# Patient Record
Sex: Female | Born: 1945 | Race: White | Hispanic: No | Marital: Married | Smoking: Former smoker
Health system: Southern US, Community
[De-identification: ages and names within clinical notes are randomized; demographics above are authoritative.]

## PROBLEM LIST (undated history)

## (undated) DIAGNOSIS — F329 Major depressive disorder, single episode, unspecified: Secondary | ICD-10-CM

## (undated) DIAGNOSIS — J45909 Unspecified asthma, uncomplicated: Secondary | ICD-10-CM

## (undated) DIAGNOSIS — E785 Hyperlipidemia, unspecified: Secondary | ICD-10-CM

## (undated) DIAGNOSIS — K219 Gastro-esophageal reflux disease without esophagitis: Secondary | ICD-10-CM

## (undated) DIAGNOSIS — F419 Anxiety disorder, unspecified: Secondary | ICD-10-CM

## (undated) DIAGNOSIS — D759 Disease of blood and blood-forming organs, unspecified: Secondary | ICD-10-CM

## (undated) DIAGNOSIS — F32A Depression, unspecified: Secondary | ICD-10-CM

## (undated) DIAGNOSIS — M1990 Unspecified osteoarthritis, unspecified site: Secondary | ICD-10-CM

## (undated) DIAGNOSIS — D680 Von Willebrand's disease: Secondary | ICD-10-CM

## (undated) DIAGNOSIS — R0600 Dyspnea, unspecified: Secondary | ICD-10-CM

## (undated) DIAGNOSIS — J449 Chronic obstructive pulmonary disease, unspecified: Secondary | ICD-10-CM

## (undated) DIAGNOSIS — R7611 Nonspecific reaction to tuberculin skin test without active tuberculosis: Secondary | ICD-10-CM

## (undated) DIAGNOSIS — C801 Malignant (primary) neoplasm, unspecified: Secondary | ICD-10-CM

## (undated) DIAGNOSIS — K2970 Gastritis, unspecified, without bleeding: Secondary | ICD-10-CM

## (undated) DIAGNOSIS — B9681 Helicobacter pylori [H. pylori] as the cause of diseases classified elsewhere: Secondary | ICD-10-CM

## (undated) HISTORY — DX: Gastritis, unspecified, without bleeding: K29.70

## (undated) HISTORY — DX: Helicobacter pylori (H. pylori) as the cause of diseases classified elsewhere: B96.81

## (undated) HISTORY — DX: Hyperlipidemia, unspecified: E78.5

## (undated) HISTORY — PX: CATARACT EXTRACTION: SUR2

## (undated) HISTORY — PX: BREAST LUMPECTOMY: SHX2

## (undated) HISTORY — PX: EYE SURGERY: SHX253

## (undated) HISTORY — DX: Depression, unspecified: F32.A

## (undated) HISTORY — DX: Major depressive disorder, single episode, unspecified: F32.9

---
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
## 1982-01-12 HISTORY — PX: TUBAL LIGATION: SHX77

## 1994-01-12 DIAGNOSIS — D6800 Von Willebrand disease, unspecified: Secondary | ICD-10-CM

## 1994-01-12 HISTORY — DX: Von Willebrand's disease: D68.0

## 1994-01-12 HISTORY — DX: Von Willebrand disease, unspecified: D68.00

## 1996-01-13 HISTORY — PX: NECK SURGERY: SHX720

## 2003-01-13 DIAGNOSIS — B9681 Helicobacter pylori [H. pylori] as the cause of diseases classified elsewhere: Secondary | ICD-10-CM

## 2003-01-13 HISTORY — DX: Helicobacter pylori (H. pylori) as the cause of diseases classified elsewhere: B96.81

## 2010-01-12 HISTORY — PX: ESOPHAGOGASTRODUODENOSCOPY: SHX1529

## 2010-03-07 ENCOUNTER — Ambulatory Visit (HOSPITAL_COMMUNITY)
Admission: RE | Admit: 2010-03-07 | Discharge: 2010-03-07 | Disposition: A | Discharge status: Home / Self Care | Payer: BC Managed Care – PPO | Source: Ambulatory Visit | Attending: Gastroenterology | Admitting: Gastroenterology

## 2010-03-07 DIAGNOSIS — R059 Cough, unspecified: Secondary | ICD-10-CM | POA: Insufficient documentation

## 2010-03-07 DIAGNOSIS — R1310 Dysphagia, unspecified: Secondary | ICD-10-CM | POA: Insufficient documentation

## 2010-03-07 DIAGNOSIS — R05 Cough: Secondary | ICD-10-CM | POA: Insufficient documentation

## 2010-03-07 DIAGNOSIS — K269 Duodenal ulcer, unspecified as acute or chronic, without hemorrhage or perforation: Secondary | ICD-10-CM | POA: Insufficient documentation

## 2010-03-07 DIAGNOSIS — K219 Gastro-esophageal reflux disease without esophagitis: Secondary | ICD-10-CM | POA: Insufficient documentation

## 2011-01-21 NOTE — Op Note (Signed)
[facility] [address] [place], [place]  [address]  OPERATIVE PROCEDURE REPORT  PATIENT:  Ellen Bowers, Ellen Bowers   MR#:  [id number] BIRTHDATE:  31-Mar-1945  GENDER:  female ENDOSCOPIST:  Coralyn Mark Dr.Mann, MD ASSISTANT:  Claudie Revering, RN CGRN and Beryle Beams, technician.  PROCEDURE DATE:  03/07/2010 PRE-PROCEDURE PREPERATION:  Patient fasted for 8 hours prior to the procedure. PRE-PROCEDURE PHYSICAL:  Patient has stable vital signs. Neck is supple. There is no JVD, thyromegaly or LAD. Chest clear to auscultation. S1 and S2 regular. Abdomen soft, non-distended, non-tender with NABS. PROCEDURE:  EGD wth placement of Bravo probe. ASA CLASS:  Class II INDICATIONS:  1) GERD 2) Dysphagia 3) Severe cough. MEDICATIONS:  Fentanyl 100 mcg & Versed 8 mg IV. TOPICAL ANESTHETIC:  Visous xylocaine-10 cc PO.  DESCRIPTION OF PROCEDURE: After the risks benefits and alternatives of the procedure were thoroughly explained, informed consent was obtained. The Pentax video gastroscope [id number] was introduced through the mouth and advanced to the second portion of the duodenum, without limitations. The instrument was slowly withdrawn as the mucosa was fully examined. <<PROCEDUREIMAGES>>  Except for a few duodenal bulb erosions, the esophagus, stomach and the proximal small bowel appeared normal. There were no ulcers, masses or polyps noted. Retroflexed views revealed no abnormalities. The Z-line was measured at 40 cm and the Brao probe was placed at 36 cm in the routin manner. Repeat esophagoscopy was done to confirm placement and for photodocumentation. The scope was then withdrawn from the patient and the procedure terminated. The patient tolerated the procedure without immediate complications.  IMPRESSION:  Few erosions in duodenal bulb-otherwise normal esophagogastroduodenoscopy; Bravo probe placed at 36 m. RECOMMENDATIONS:  1) Anti-reflux regimen to be followed. 2) Avoid NSAIDS  for now. 3) Await BRAVO results. 4) Hold PPI's. 5) OP folow up in 2 weeks.  REPEAT EXAM:  None planned for now.  DISCHARGE INSTRUCTIONS: Standard discharge instructions given.  ______________________________ Benjamine Mola, MD  CPT CODES: 95621-30  DIAGNOSIS CODES:  530.81, 787.2, [phone]  CC:  Kirstie Peri, M.D.  n. eSIGNED:   Dr.Jyothi Anastasio Auerbach at 01/21/2011 10:21 AM  Kendrick Fries, [id number]

## 2012-02-16 ENCOUNTER — Other Ambulatory Visit: Payer: Self-pay | Admitting: Gastroenterology

## 2012-05-30 ENCOUNTER — Encounter (HOSPITAL_COMMUNITY)
Admission: RE | Disposition: A | Discharge status: Home / Self Care | Payer: Self-pay | Source: Ambulatory Visit | Attending: Gastroenterology

## 2012-05-30 ENCOUNTER — Other Ambulatory Visit: Payer: Self-pay | Admitting: Gastroenterology

## 2012-05-30 ENCOUNTER — Encounter (HOSPITAL_COMMUNITY): Payer: Self-pay | Admitting: *Deleted

## 2012-05-30 ENCOUNTER — Ambulatory Visit (HOSPITAL_COMMUNITY)
Admission: RE | Admit: 2012-05-30 | Discharge: 2012-05-30 | Disposition: A | Discharge status: Home / Self Care | Payer: Medicare Other | Source: Ambulatory Visit | Attending: Gastroenterology | Admitting: Gastroenterology

## 2012-05-30 DIAGNOSIS — K219 Gastro-esophageal reflux disease without esophagitis: Secondary | ICD-10-CM | POA: Insufficient documentation

## 2012-05-30 DIAGNOSIS — K625 Hemorrhage of anus and rectum: Secondary | ICD-10-CM | POA: Insufficient documentation

## 2012-05-30 DIAGNOSIS — Z79899 Other long term (current) drug therapy: Secondary | ICD-10-CM | POA: Insufficient documentation

## 2012-05-30 DIAGNOSIS — Z800 Family history of malignant neoplasm of digestive organs: Secondary | ICD-10-CM | POA: Insufficient documentation

## 2012-05-30 DIAGNOSIS — D126 Benign neoplasm of colon, unspecified: Secondary | ICD-10-CM | POA: Insufficient documentation

## 2012-05-30 DIAGNOSIS — Z7982 Long term (current) use of aspirin: Secondary | ICD-10-CM | POA: Insufficient documentation

## 2012-05-30 DIAGNOSIS — J4489 Other specified chronic obstructive pulmonary disease: Secondary | ICD-10-CM | POA: Insufficient documentation

## 2012-05-30 DIAGNOSIS — D6800 Von Willebrand disease, unspecified: Secondary | ICD-10-CM | POA: Insufficient documentation

## 2012-05-30 DIAGNOSIS — J449 Chronic obstructive pulmonary disease, unspecified: Secondary | ICD-10-CM | POA: Insufficient documentation

## 2012-05-30 HISTORY — DX: Unspecified asthma, uncomplicated: J45.909

## 2012-05-30 HISTORY — DX: Von Willebrand's disease: D68.0

## 2012-05-30 HISTORY — DX: Unspecified osteoarthritis, unspecified site: M19.90

## 2012-05-30 HISTORY — PX: COLONOSCOPY: SHX5424

## 2012-05-30 HISTORY — DX: Gastro-esophageal reflux disease without esophagitis: K21.9

## 2012-05-30 HISTORY — DX: Chronic obstructive pulmonary disease, unspecified: J44.9

## 2012-05-30 SURGERY — COLONOSCOPY
Anesthesia: Moderate Sedation

## 2012-05-30 MED ORDER — SODIUM CHLORIDE 0.9 % IV SOLN: INTRAVENOUS | Status: DC

## 2012-05-30 MED ORDER — FENTANYL CITRATE 0.05 MG/ML IJ SOLN
INTRAMUSCULAR | Status: AC
  Filled 2012-05-30: qty 2

## 2012-05-30 MED ORDER — MIDAZOLAM HCL 10 MG/2ML IJ SOLN
INTRAMUSCULAR | Status: AC
  Filled 2012-05-30: qty 2

## 2012-05-30 MED ORDER — FENTANYL CITRATE 0.05 MG/ML IJ SOLN
INTRAMUSCULAR | Status: DC | PRN
  Administered 2012-05-30 (×4): 25 ug via INTRAVENOUS

## 2012-05-30 MED ORDER — SODIUM CHLORIDE 0.9 % IV SOLN
INTRAVENOUS | Status: DC
  Administered 2012-05-30: 12:00:00 via INTRAVENOUS

## 2012-05-30 MED ORDER — MIDAZOLAM HCL 10 MG/2ML IJ SOLN
INTRAMUSCULAR | Status: DC | PRN
  Administered 2012-05-30 (×4): 2 mg via INTRAVENOUS

## 2012-05-30 NOTE — OR Nursing (Signed)
MD requested DDAVP to be given prior to procedure.  Patient called to come in early for administration of medication.  She states that she has not had this medication for the past five procedures and doesn't want to have it this time.  She also states that the only time she did have this medication it made her very sick.  Insurance is also a concern for her.  Candy Sledge called Dr. Loreta Ave and relayed this information.  She wants the pharmacy to have DDAVP ready in the event the patient needs it.  Pharmacy has been notified.

## 2012-05-30 NOTE — Op Note (Signed)
[facility] [address] [place], [address]   OPERATIVE PROCEDURE REPORT  PATIENT: Ellen Bowers, Ellen Bowers  MR#: [id number] BIRTHDATE: [birth year] GENDER: Female ENDOSCOPIST: Dr.  Lorenza Burton, MD ASSISTANT:   Jadene Pierini, technician Dorisann Frames, technician Jeneen Montgomery, RN PROCEDURE DATE: 05/30/2012 PRE-PROCEDURE PREPARATION: The patient was prepped with a gallon of Golytely the night prior to the procedure.  The patient was fasted for 4 hours prior to the procedure.  PRE-PROCEDURE PHYSICAL: Patient has stable vital signs.  Neck is supple.  There is no JVD, thyromegaly or LAD.  Chest clear to auscultation.  S1 and S2 regular.  Abdomen soft, non-distended, non-tender with NABS. PROCEDURE:     Colonoscopy with hot snare polypectomy x 4 and cold biopsy x 1. ASA CLASS:     Class III INDICATIONS:     1.  Rectal bleeding 2. Personal history of colonic polyps-tubular adenoma 3. CRC screening 4. Family history of colon cancer-mother and father. MEDICATIONS:     Fentanyl 100 mcg and Versed 8 mg IV  DESCRIPTION OF PROCEDURE: After the risks, benefits, and alternatives of the procedure were thoroughly explained [including a 10% missed rate of cancer and polyps], informed consent was obtained.  Digital rectal exam was performed.  The Pentax Adult Colon [id number]  was introduced through the anus  and advanced to the cecum, which was identified by both the appendix and ileocecal valve , limited by No adverse events experienced.   The quality of the prep was excellent. . Multiple washes were done. Small lesions could be missed. The instrument was then slowly withdrawn as the colon was fully examined.     COLON FINDINGS: Four sessile polyps measuring 5-8 mm were found in the proximal ascending colon and were removed by hot snare x 4-150./15. Another dimunitive polyp was removed from the proximal right colon by a cold biopsy x 1. The rest of the colonic  mucosa appeared healthy with a normal vascular pattern.  No masses, diverticula or AVMs were noted.  The appendiceal orifice and the ICV were identified and photographed. Retroflexed views revealed no abnormalities.  The patient tolerated the procedure without immediate complications.  The scope was then withdrawn from the patient and the procedure terminated.  TIME TO CECUM:   5 minutes 0 seconds WITHDRAW TIME:  9 minutes 0 seconds  IMPRESSION:     Multiple polyps removed from the right colon-see description above; otherwise normal colonoscopy upto the cecum.   RECOMMENDATIONS:     1.  Hold all NSAIDS for 2 weeks. 2.  Await pathology results. 3.  Continue surveillance. 4.  High fiber diet with liberal fluid intake. 5.  OP follow-up is advised on a PRN basis.   REPEAT EXAM:      In 3 years  for a repeat colonoscopy.  If the patient has any abnormal GI symptoms in the interim, she/he have been advised to contact the office as soon as possible for further recommendations.   CPT CODES:     [id number], Colonoscopy with Polypectomy (Snare)   DIAGNOSIS CODES:     569.3 Hematochezia V76.51 Colorectal cancer screening V16.0 Family History of Colon Cancer V12.72 Personal History of Polyps   REFERRED QI:ONGEXB Sherryll Burger, M.D.  eSigned:  Dr. Lorenza Burton, MD 05/30/2012 7:25 PM   PATIENT NAME:  Ellen Bowers MR#: [id number]

## 2012-05-30 NOTE — H&P (Signed)
Ellen Bowers is an 67 y.o. female.   Chief Complaint: CRC screening / rectal bleeding. HPI:  67 year old white female, here for a colonoscopy. She has Von Willebrand's disease. She has a history of colonic polyps and has had a tubular adenoma removed in the past. She is on Dexilant for reflux. Her mother was diagnosed with colon cancer at 33 and her father was diagnosed at the age of 38. Past Medical History  Diagnosis Date  . Asthma   . COPD (chronic obstructive pulmonary disease)   . GERD (gastroesophageal reflux disease)   . Arthritis   . Von Willebrand disease 1996   Past Surgical History  Procedure Laterality Date  . Tubal ligation  1984  . Neck surgery  1951    cyst removal  . Tonsillectomy  1951  . Cataract extraction      double   History reviewed. No pertinent family history. Social History:  reports that she quit smoking about 3 years ago. Her smoking use included Cigarettes. She has a 50 pack-year smoking history. She does not have any smokeless tobacco history on file. She reports that  drinks alcohol. Her drug history is not on file.  Allergies:  Allergies  Allergen Reactions  . Betadine (Povidone Iodine) Rash   Medications Prior to Admission  Medication Sig Dispense Refill  . Ascorbic Acid (VITAMIN C) 1000 MG tablet Take 1,000 mg by mouth daily.      [name] aspirin 81 MG tablet Take 81 mg by mouth daily.      [name] buPROPion (WELLBUTRIN SR) 150 MG 12 hr tablet Take 150 mg by mouth 2 (two) times daily.      . celecoxib (CELEBREX) 200 MG capsule Take 200 mg by mouth 2 (two) times daily.      . cetirizine (ZYRTEC) 10 MG tablet Take 10 mg by mouth daily.      [name] dexlansoprazole (DEXILANT) 60 MG capsule Take 60 mg by mouth daily.      . Fluticasone-Salmeterol (ADVAIR) 250-50 MCG/DOSE AEPB Inhale 1 puff into the lungs every 12 (twelve) hours.      . solifenacin (VESICARE) 5 MG tablet Take 5 mg by mouth daily.      . zafirlukast (ACCOLATE) 20 MG tablet Take 20 mg by mouth 2  (two) times daily.      [name] albuterol (PROVENTIL) (2.5 MG/3ML) 0.083% nebulizer solution Take 2.5 mg by nebulization every 6 (six) hours as needed for wheezing.        No results found for this or any previous visit (from the past 48 hour(s)). No results found.  Review of Systems  Constitutional: Negative.   Eyes: Negative.   Respiratory: Negative.   Cardiovascular: Negative.   Gastrointestinal: Positive for heartburn. Negative for nausea, vomiting, abdominal pain, diarrhea and constipation.  Genitourinary: Negative.   Musculoskeletal: Positive for joint pain.  Skin: Negative.   Neurological: Negative.     Blood pressure 132/78, pulse 81, temperature 98.1 F (36.7 C), temperature source Oral, resp. rate 19, height 5\' 9"  (1.753 m), weight 90.719 kg (200 lb), SpO2 97.00%. Physical Exam  Constitutional: She is oriented to person, place, and time. She appears well-nourished.  HENT:  Head: Normocephalic and atraumatic.  Eyes: Conjunctivae and EOM are normal. Pupils are equal, round, and reactive to light.  Neck: Normal range of motion. Neck supple.  Cardiovascular: Normal rate and regular rhythm.   Respiratory: Effort normal and breath sounds normal.  GI: Soft. Bowel sounds are normal.  Musculoskeletal:  Normal range of motion.  Neurological: She is alert and oriented to person, place, and time.  Skin: Skin is warm and dry.  Psychiatric: She has a normal mood and affect. Her behavior is normal. Judgment and thought content normal.     Assessment/Plan 1) Rectal bleeding/CRC screening/hemorrhoids/family history of colon cancer-mother diagnosed at 13 and father at 56. proceed with a colonoscopy at this time. We have DDAVP on standby.  2) GERD: on Dexilant.   Shannen Flansburg 05/30/2012, 1:10 PM

## 2012-06-01 ENCOUNTER — Encounter (HOSPITAL_COMMUNITY): Payer: Self-pay | Admitting: Gastroenterology

## 2012-08-16 ENCOUNTER — Other Ambulatory Visit: Payer: Self-pay | Admitting: Gastroenterology

## 2012-08-16 DIAGNOSIS — R1310 Dysphagia, unspecified: Secondary | ICD-10-CM

## 2012-08-23 ENCOUNTER — Ambulatory Visit
Admission: RE | Admit: 2012-08-23 | Discharge: 2012-08-23 | Disposition: A | Discharge status: Home / Self Care | Payer: Medicare Other | Source: Ambulatory Visit | Attending: Gastroenterology | Admitting: Gastroenterology

## 2012-08-23 DIAGNOSIS — R1310 Dysphagia, unspecified: Secondary | ICD-10-CM

## 2013-08-01 ENCOUNTER — Encounter: Payer: Self-pay | Admitting: Internal Medicine

## 2013-09-06 ENCOUNTER — Encounter (INDEPENDENT_AMBULATORY_CARE_PROVIDER_SITE_OTHER): Payer: Self-pay

## 2013-09-06 ENCOUNTER — Encounter: Payer: Self-pay | Admitting: Gastroenterology

## 2013-09-06 ENCOUNTER — Ambulatory Visit (INDEPENDENT_AMBULATORY_CARE_PROVIDER_SITE_OTHER): Payer: Medicare Other | Admitting: Gastroenterology

## 2013-09-06 VITALS — BP 143/73 | HR 81 | Temp 97.1°F | Ht 69.0 in | Wt 206.4 lb

## 2013-09-06 DIAGNOSIS — K219 Gastro-esophageal reflux disease without esophagitis: Secondary | ICD-10-CM | POA: Insufficient documentation

## 2013-09-06 DIAGNOSIS — R05 Cough: Secondary | ICD-10-CM

## 2013-09-06 DIAGNOSIS — R059 Cough, unspecified: Secondary | ICD-10-CM

## 2013-09-06 DIAGNOSIS — R053 Chronic cough: Secondary | ICD-10-CM | POA: Insufficient documentation

## 2013-09-06 NOTE — Progress Notes (Signed)
Primary Care Physician:  [name], MD  Primary Gastroenterologist:  Garfield Cornea, MD   Chief Complaint  Patient presents with  . Gastrophageal Reflux    cough worse with eating and lying down    HPI:  Ellen Bowers is a 68 y.o. female here at the request of Dr. Manuella Ghazi for further evaluation of refractory GERD. Her primary symptoms are that of worsening cough when eating and lying down. Esophagram in 08/2012 showed small hh with faint GERD, prominent cricopharyngeus muscle, barium pill passes without delay. She gives h/o pH bravo off meds done remotely. States it was terrible having to be off her PPI for the study. States it showed bad GERD. We have requested records but not yet received.   She complains of coughing within 20 mins after meals and when she lays down at night. Coughing for 3 months. Notes she has COPD and seasonal allergies as well. Chest [facility] 06/2013 showed emphysema, stable 67mm right lower lobe pulmonary nodule.   Also c/o frequent hoarseness. No dysphagia. Has been on dexilant for 3 years.Takes antacid liquids bedtime. Failed all PPIs but has never tried Zegerid or Aciphex.  States she had inadequate sedation with prior colonoscopies in [place] and therefore had last one by Dr. Collene Mares (fentanyl 100 and versed 8) with adequate sedation.    Current Outpatient Prescriptions  Medication Sig Dispense Refill  . albuterol (PROVENTIL) (2.5 MG/3ML) 0.083% nebulizer solution Take 2.5 mg by nebulization every 6 (six) hours as needed for wheezing.      . Ascorbic Acid (VITAMIN C) 1000 MG tablet Take 1,000 mg by mouth daily.      [name] buPROPion (WELLBUTRIN SR) 150 MG 12 hr tablet Take 150 mg by mouth 2 (two) times daily.      . celecoxib (CELEBREX) 200 MG capsule Take 200 mg by mouth 2 (two) times daily.      . cetirizine (ZYRTEC) 10 MG tablet Take 10 mg by mouth daily.      [name] dexlansoprazole (DEXILANT) 60 MG capsule Take 60 mg by mouth daily.      . Fluticasone-Salmeterol  (ADVAIR) 250-50 MCG/DOSE AEPB Inhale 1 puff into the lungs every 12 (twelve) hours.      . NON FORMULARY Calcium/ Magnesium  1000/500   One tablet daily      . solifenacin (VESICARE) 5 MG tablet Take 5 mg by mouth daily.      . sucralfate (CARAFATE) 1 G tablet Take 1 g by mouth 4 (four) times daily -  with meals and at bedtime.      . zafirlukast (ACCOLATE) 20 MG tablet Take 20 mg by mouth 2 (two) times daily.       No current facility-administered medications for this visit.    Allergies as of 09/06/2013 - Review Complete 09/06/2013  Allergen Reaction Noted  . Betadine [povidone iodine] Rash 05/30/2012    Past Medical History  Diagnosis Date  . Asthma   . COPD (chronic obstructive pulmonary disease)   . GERD (gastroesophageal reflux disease)   . Arthritis   . Von Willebrand disease 1996    Past Surgical History  Procedure Laterality Date  . Tubal ligation  1984  . Neck surgery  1951    cyst removal  . Tonsillectomy  1951  . Cataract extraction      double  . Colonoscopy N/A 05/30/2012    Dr.Mann:Multiple polyps removed from the right colon-see description above; otherwise normal colonoscopy up to the cecum. 5 polyps, multiple  tubular adenomas. next tcs 05/2015  . Neck surgery  1998    growth removed    Family History  Problem Relation Age of Onset  . Colon cancer Mother     less than age 42  . Colon cancer Father     more than age 63  . Liver disease Neg Hx     History   Social History  . Marital Status: Married    Spouse Name: N/A    Number of Children: N/A  . Years of Education: N/A   Occupational History  . Not on file.   Social History Main Topics  . Smoking status: Former Smoker -- 1.00 packs/day for 50 years    Types: Cigarettes    Quit date: 05/30/2009  . Smokeless tobacco: Not on file     Comment: Quit smoking x 5 years  . Alcohol Use: Yes     Comment: occasionally    . Drug Use: No  . Sexual Activity: Not on file   Other Topics Concern  .  Not on file   Social History Narrative  . No narrative on file      ROS:  General: Negative for anorexia, weight loss, fever, chills, fatigue, weakness. Eyes: Negative for vision changes.  ENT: see hpi CV: Negative for chest pain, angina, palpitations, dyspnea on exertion, peripheral edema.  Respiratory: Negative for dyspnea at rest,   sputum, wheezing. See hpi. +DOE. GI: See history of present illness. GU:  Negative for dysuria, hematuria, urinary incontinence, urinary frequency, nocturnal urination.  MS: Negative for joint pain, low back pain.  Derm: Negative for rash or itching.  Neuro: Negative for weakness, abnormal sensation, seizure, frequent headaches, memory loss, confusion.  Psych: Negative for anxiety, depression, suicidal ideation, hallucinations.  Endo: Negative for unusual weight change.  Heme: Negative for bruising or bleeding. Allergy: Negative for rash or hives.    Physical Examination:  BP 143/73  Pulse 81  Temp(Src) 97.1 F (36.2 C) (Oral)  Ht 5\' 9"  (1.753 m)  Wt 206 lb 6.4 oz (93.622 kg)  BMI 30.47 kg/m2   General: Well-nourished, well-developed in no acute distress.  Head: Normocephalic, atraumatic.   Eyes: Conjunctiva pink, no icterus. Mouth: Oropharyngeal mucosa moist and pink , no lesions erythema or exudate. Neck: Supple without thyromegaly, masses, or lymphadenopathy.  Lungs: Clear to auscultation bilaterally.  Heart: Regular rate and rhythm, no murmurs rubs or gallops.  Abdomen: Bowel sounds are normal, nontender, nondistended, no hepatosplenomegaly or masses, no abdominal bruits or    hernia , no rebound or guarding.   Rectal: not performed Extremities: No lower extremity edema. No clubbing or deformities.  Neuro: Alert and oriented x 4 , grossly normal neurologically.  Skin: Warm and dry, no rash or jaundice.   Psych: Alert and cooperative, normal mood and affect.

## 2013-09-06 NOTE — Patient Instructions (Signed)
1. Once your records have been reviewed we will make further recommendations.

## 2013-09-11 NOTE — Assessment & Plan Note (Addendum)
68 y/o female with h/o chronic GERD, COPD, allergies who presents with complaints of refractory postprandial and nocturnal cough, intermittent hoarseness. Cannot exclude LPR, but difficult to separate these types symptoms from other obvious etiologies of cough etc. She has been on chronic Dexilant. Prior Bravo off PPI was positive per patient. We have requested these records. Once reviewed, we will make further recommendations.   Discussed antireflux measures.   FYI, patient has von Willebrand disease.

## 2013-09-13 NOTE — Progress Notes (Signed)
Cc to pcp °

## 2013-10-25 ENCOUNTER — Encounter: Payer: Self-pay | Admitting: Gastroenterology

## 2013-10-25 NOTE — Progress Notes (Signed)
Please let patient know that after multiple attempts we finally received her records.  Positive Bravo pH study in 2012 off PPI. Last EGD 2012, only a few erosions the duodenal bulb, esophagus normal.  Doubt repeat endoscopy at this time would be that beneficial.  Recommend increasing Dexilant 60mg  BID one before breakfast and one before evening meal for next 2 months. You may send in RX for #60 and 1 refill.  OV with RMR only in 4-6 weeks.

## 2013-11-10 NOTE — Progress Notes (Signed)
Tried to call- NA 

## 2013-11-17 ENCOUNTER — Other Ambulatory Visit: Payer: Self-pay | Admitting: Gastroenterology

## 2013-11-17 MED ORDER — DEXLANSOPRAZOLE 60 MG PO CPDR: 60.0000 mg | DELAYED_RELEASE_CAPSULE | Freq: Two times a day (BID) | ORAL | Status: DC

## 2013-11-17 NOTE — Progress Notes (Signed)
Per Almyra Free, patient prefers female doctor and would like to have Dr. Oneida Alar. Never been seen by Dr. Gala Romney, just by me X 1. Should be fine to switch.  Please make her an appt with [facility] only, 4-6 weeks.

## 2013-11-17 NOTE — Progress Notes (Signed)
Pt is aware. rx sent in

## 2013-11-21 NOTE — Progress Notes (Signed)
APPT MADE AND PATIENT AWARE °

## 2013-11-21 NOTE — Progress Notes (Signed)
Ellen Bowers, please schedule ov with [facility]

## 2013-12-28 ENCOUNTER — Other Ambulatory Visit: Payer: Self-pay | Admitting: Gastroenterology

## 2013-12-28 ENCOUNTER — Ambulatory Visit (INDEPENDENT_AMBULATORY_CARE_PROVIDER_SITE_OTHER): Payer: Medicare Other | Admitting: Gastroenterology

## 2013-12-28 ENCOUNTER — Encounter: Payer: Self-pay | Admitting: Gastroenterology

## 2013-12-28 ENCOUNTER — Encounter (INDEPENDENT_AMBULATORY_CARE_PROVIDER_SITE_OTHER): Payer: Self-pay

## 2013-12-28 ENCOUNTER — Other Ambulatory Visit: Payer: Self-pay

## 2013-12-28 ENCOUNTER — Telehealth: Payer: Self-pay

## 2013-12-28 VITALS — BP 136/84 | HR 85 | Temp 97.6°F | Ht 69.0 in | Wt 210.4 lb

## 2013-12-28 DIAGNOSIS — R053 Chronic cough: Secondary | ICD-10-CM

## 2013-12-28 DIAGNOSIS — K219 Gastro-esophageal reflux disease without esophagitis: Secondary | ICD-10-CM

## 2013-12-28 DIAGNOSIS — K6289 Other specified diseases of anus and rectum: Secondary | ICD-10-CM

## 2013-12-28 DIAGNOSIS — R05 Cough: Secondary | ICD-10-CM

## 2013-12-28 DIAGNOSIS — K625 Hemorrhage of anus and rectum: Secondary | ICD-10-CM

## 2013-12-28 DIAGNOSIS — G8929 Other chronic pain: Secondary | ICD-10-CM | POA: Insufficient documentation

## 2013-12-28 MED ORDER — DEXLANSOPRAZOLE 60 MG PO CPDR: 60.0000 mg | DELAYED_RELEASE_CAPSULE | Freq: Two times a day (BID) | ORAL | Status: DC

## 2013-12-28 MED ORDER — BACLOFEN 10 MG PO TABS: ORAL_TABLET | ORAL | Status: DC

## 2013-12-28 NOTE — Assessment & Plan Note (Signed)
DOUBT DUE TO REFLUX-MAY BE DUE TO SILENT ASPIRATION OR ASTHMA/COPD.  MODIFIED BARIUM SWALLOW-PT REQUESTED TEST IN JAN 2016.

## 2013-12-28 NOTE — Telephone Encounter (Signed)
Called [employer] and spoke with Lyssa. Ref # [id number]. No pre cert required for her flex sig, banding or her modified barium swallow.

## 2013-12-28 NOTE — Patient Instructions (Addendum)
COMPLETE THE MODIFIED BARIUM SWALLOW AND FLEXIBLE SIGMOIDOSCOPY IN JAN 2016.  USE PREPARATION H 1-2 TIMES DAILY. AVOID USING STEROIDS CREAM MORE THAN 30-40 DAYS A YEAR.  ADD BACLOFEN 30 MINS PRIOR TO BREAKFAST, LUNCH, DINNER, AND AT BEDTIME. AT A MINIMUM TAKE BACLOFEN BEFORE BREAKFAST AND AT BEDTIME. BACLOFEN MAY CAUSE FATIGUE, NAUSEA, DIZZINESS, OR CONSTIPATION.  AVOID REFLUX TRIGGERS. SEE INFO BELOW.  CONTINUE YOUR WEIGHT LOSS EFFORTS.  CONTINUE DEXILANT.  FOLLOW UP IN 4 MOS.      Lifestyle and home remedies TO MANAGE REFLUX  You may eliminate or reduce the frequency of heartburn by making the following lifestyle changes:  . Control your weight. Being overweight is a major risk factor for heartburn and GERD. Excess pounds put pressure on your abdomen, pushing up your stomach and causing acid to back up into your esophagus.   . Eat smaller meals. 4 TO 6 MEALS A DAY. This reduces pressure on the lower esophageal sphincter, helping to prevent the valve from opening and acid from washing back into your esophagus.   Dolphus Jenny your belt. Clothes that fit tightly around your waist put pressure on your abdomen and the lower esophageal sphincter.   . Eliminate heartburn triggers. Everyone has specific triggers. Common triggers such as fatty or fried foods, spicy food, tomato sauce, carbonated beverages, alcohol, chocolate, mint, garlic, onion, caffeine and nicotine may make heartburn worse.   [name] Avoid stooping or bending. Tying your shoes is OK. Bending over for longer periods to weed your garden isn't, especially soon after eating.   . Don't lie down after a meal. Wait at least three to four hours after eating before going to bed, and don't lie down right after eating.   [name] PUT THE HEAD OF YOUR BED ON 6 INCH BLOCKS.  HEMORRHOIDAL BANDING COMPLICATIONS(<1%)  COMMON: 1. MINOR PAIN  UNCOMMON: 1. ABSCESS 2. BAND FALLS OFF 3. PROLAPSE OF HEMORRHOIDS AND PAIN 4. ULCER BLEEDING  A.  USUALLY SELF-LIMITED: MAY LAST 3-5 DAYS  B. MAY REQUIRE INTERVENTION: 1-2 WEEKS AFTER INTERACTIONS 5. NECROTIZING PELVIC SEPSIS  A. SYMPTOMS: FEVER, PAIN, DIFFICULTY URINATING   Low-Fat Diet BREADS, CEREALS, PASTA, RICE, DRIED PEAS, AND BEANS These products are high in carbohydrates and most are low in fat. Therefore, they can be increased in the diet as substitutes for fatty foods. They too, however, contain calories and should not be eaten in excess. Cereals can be eaten for snacks as well as for breakfast.   FRUITS AND VEGETABLES It is good to eat fruits and vegetables. Besides being sources of fiber, both are rich in vitamins and some minerals. They help you get the daily allowances of these nutrients. Fruits and vegetables can be used for snacks and desserts.  MEATS Limit lean meat, chicken, [place], and fish to no more than 6 ounces per day. Beef, Pork, and Lamb Use lean cuts of beef, pork, and lamb. Lean cuts include:  Extra-lean ground beef.  Arm roast.  Sirloin tip.  Center-cut ham.  Round steak.  Loin chops.  Rump roast.  Tenderloin.  Trim all fat off the outside of meats before cooking. It is not necessary to severely decrease the intake of red meat, but lean choices should be made. Lean meat is rich in protein and contains a highly absorbable form of iron. Premenopausal women, in particular, should avoid reducing lean red meat because this could increase the risk for low red blood cells (iron-deficiency anemia).  Chicken and [place] These are good sources of protein.  The fat of poultry can be reduced by removing the skin and underlying fat layers before cooking. Chicken and [place] can be substituted for lean red meat in the diet. Poultry should not be fried or covered with high-fat sauces. Fish and Shellfish Fish is a good source of protein. Shellfish contain cholesterol, but they usually are low in saturated fatty acids. The preparation of fish is important. Like chicken and  [place], they should not be fried or covered with high-fat sauces. EGGS Egg whites contain no fat or cholesterol. They can be eaten often. Try 1 to 2 egg whites instead of whole eggs in recipes or use egg substitutes that do not contain yolk. MILK AND DAIRY PRODUCTS Use skim or 1% milk instead of 2% or whole milk. Decrease whole milk, natural, and processed cheeses. Use nonfat or low-fat (2%) cottage cheese or low-fat cheeses made from vegetable oils. Choose nonfat or low-fat (1 to 2%) yogurt. Experiment with evaporated skim milk in recipes that call for heavy cream. Substitute low-fat yogurt or low-fat cottage cheese for sour cream in dips and salad dressings. Have at least 2 servings of low-fat dairy products, such as 2 glasses of skim (or 1%) milk each day to help get your daily calcium intake. FATS AND OILS Reduce the total intake of fats, especially saturated fat. Butterfat, lard, and beef fats are high in saturated fat and cholesterol. These should be avoided as much as possible. Vegetable fats do not contain cholesterol, but certain vegetable fats, such as coconut oil, palm oil, and palm kernel oil are very high in saturated fats. These should be limited. These fats are often used in bakery goods, processed foods, popcorn, oils, and nondairy creamers. Vegetable shortenings and some peanut butters contain hydrogenated oils, which are also saturated fats. Read the labels on these foods and check for saturated vegetable oils. Unsaturated vegetable oils and fats do not raise blood cholesterol. However, they should be limited because they are fats and are high in calories. Total fat should still be limited to 30% of your daily caloric intake. Desirable liquid vegetable oils are corn oil, cottonseed oil, olive oil, canola oil, safflower oil, soybean oil, and sunflower oil. Peanut oil is not as good, but small amounts are acceptable. Buy a heart-healthy tub margarine that has no partially hydrogenated oils in  the ingredients. Mayonnaise and salad dressings often are made from unsaturated fats, but they should also be limited because of their high calorie and fat content. Seeds, nuts, peanut butter, olives, and avocados are high in fat, but the fat is mainly the unsaturated type. These foods should be limited mainly to avoid excess calories and fat. OTHER EATING TIPS Snacks  Most sweets should be limited as snacks. They tend to be rich in calories and fats, and their caloric content outweighs their nutritional value. Some good choices in snacks are graham crackers, melba toast, soda crackers, bagels (no egg), English muffins, fruits, and vegetables. These snacks are preferable to snack crackers, [place] fries, TORTILLA CHIPS, and POTATO chips. Popcorn should be air-popped or cooked in small amounts of liquid vegetable oil. Desserts Eat fruit, low-fat yogurt, and fruit ices instead of pastries, cake, and cookies. Sherbet, angel food cake, gelatin dessert, frozen low-fat yogurt, or other frozen products that do not contain saturated fat (pure fruit juice bars, frozen ice pops) are also acceptable.  COOKING METHODS Choose those methods that use little or no fat. They include: Poaching.  Braising.  Steaming.  Grilling.  Baking.  Stir-frying.  Broiling.  Microwaving.  Foods can be cooked in a nonstick pan without added fat, or use a nonfat cooking spray in regular cookware. Limit fried foods and avoid frying in saturated fat. Add moisture to lean meats by using water, broth, cooking wines, and other nonfat or low-fat sauces along with the cooking methods mentioned above. Soups and stews should be chilled after cooking. The fat that forms on top after a few hours in the refrigerator should be skimmed off. When preparing meals, avoid using excess salt. Salt can contribute to raising blood pressure in some people.  EATING AWAY FROM HOME Order entres, potatoes, and vegetables without sauces or butter. When  meat exceeds the size of a deck of cards (3 to 4 ounces), the rest can be taken home for another meal. Choose vegetable or fruit salads and ask for low-calorie salad dressings to be served on the side. Use dressings sparingly. Limit high-fat toppings, such as bacon, crumbled eggs, cheese, sunflower seeds, and olives. Ask for heart-healthy tub margarine instead of butter.

## 2013-12-28 NOTE — Progress Notes (Signed)
Subjective:    Patient ID: Ellen Bowers, female    DOB: 1945/06/11, 68 y.o.   MRN: [id number]  [facility], MD  HPI Had PFTs 3 yrs ago-has asthma/copd. Last week had an asthma problem started on SINGULAIR and Nebs. Feels a little better. DEXILANT 60 MG BID. COUGHING AFTER EATING. NO CHOKING. QUIT SMOKING. DRINKS COFFEE-CAFFEINE. A FEW SODA A WEEK: COKE. EATS FATTY FOODS. RARE CHOCOLATE(DOVE BAG LASTS ABOUT A  MONTH). REFLUX SX: ? DRY COUGH, BURNING IN CHEST BEFORE DEXILANT BID. BMs: 2-3X/DAY-NL. EAT FIBER. DRINKS WATER: QUITE A BIT. SOB: WITH EXERTION OVER LAST 2.5 WKS. LAST TCS: ~1-2 YRS AGO. MAALOX QHS HELPS COUGH BUT CARAFATE DOES NOT. HEMORRHOIDS: PAIN WITH BMs. INFREQUENT BMs. SUPP/CREAM USING THEM EVERY NIGHT/DAY AND IF SHE STOPS THE HEMORRHOIDS GET WORSE FOR 7 MOS. HAS HAD ANAL; FISSURE IN THE PAST, BUT NOT NOW. OCCASIONAL LOWER ABDOMINAL PAIN. HAD INADEQUATE SEDATION WITH DEMEROL AND VERSED. OK WITH FENTANYL/VERSED.  PT DENIES FEVER, CHILLS, nausea, vomiting, melena, diarrhea, CHEST PAIN, CHANGE IN BOWEL IN HABITS, constipation, problems swallowing, OR heartburn or indigestion.  Past Medical History  Diagnosis Date  . Asthma   . COPD (chronic obstructive pulmonary disease)   . GERD (gastroesophageal reflux disease)   . Arthritis   . Von Willebrand disease 1996  . Helicobacter pylori gastritis 2005    treated   Past Surgical History  Procedure Laterality Date  . Tubal ligation  1984  . Neck surgery  1951    cyst removal  . Tonsillectomy  1951  . Cataract extraction      double  . Colonoscopy N/A 05/30/2012    Dr.Mann:Multiple polyps removed from the right colon-see description above; otherwise normal colonoscopy up to the cecum. 5 polyps, multiple tubular adenomas. next tcs 05/2015  . Neck surgery  1998    growth removed  . Esophagogastroduodenoscopy  2012    Dr. Collene Mares: few erosions in duodenal bulb. BRAVO off PPI ("severe reflux")   Allergies  Allergen Reactions  .  Betadine [Povidone Iodine] Rash   Current Outpatient Prescriptions  Medication Sig Dispense Refill  . albuterol (PROVENTIL) (2.5 MG/3ML) 0.083% nebulizer solution Take 2.5 mg by nebulization every 6 (six) hours as needed for wheezing.    . Ascorbic Acid (VITAMIN C) 1000 MG tablet Take 1,000 mg by mouth daily.    [name] aspirin 81 MG tablet Take 81 mg by mouth daily.    [name] buPROPion (WELLBUTRIN SR) 150 MG 12 hr tablet Take 150 mg by mouth 2 (two) times daily.    . celecoxib (CELEBREX) 200 MG capsule Take 200 mg by mouth 2 (two) times daily.    . cetirizine (ZYRTEC) 10 MG tablet Take 10 mg by mouth daily.    [name] dexlansoprazole (DEXILANT) 60 MG capsule Take 1 capsule (60 mg total) by mouth 2 (two) times daily.    . Fluticasone-Salmeterol (ADVAIR) 250-50 MCG/DOSE AEPB Inhale 1 puff into the lungs every 12 (twelve) hours.    [name] HYDROCORTISONE ACE, RECTAL, 30 MG SUPP     . montelukast (SINGULAIR) 10 MG tablet Take 10 mg by mouth at bedtime.    . NON FORMULARY Calcium/ Magnesium  1000/500   One tablet daily    . pravastatin (PRAVACHOL) 20 MG tablet Take 20 mg by mouth daily.    [name] PROCTOSOL HC 2.5 % rectal cream     . solifenacin (VESICARE) 5 MG tablet Take 5 mg by mouth daily.    . sucralfate (CARAFATE) 1 G tablet Take  1 g by mouth 4 (four) times daily -  with meals and at bedtime. NOW PRN   . zafirlukast (ACCOLATE) 20 MG tablet Take 20 mg by mouth 2 (two) times daily.     Review of Systems     Objective:   Physical Exam  Constitutional: She is oriented to person, place, and time. She appears well-developed and well-nourished. No distress.  HENT:  Head: Normocephalic and atraumatic.  Mouth/Throat: Oropharynx is clear and moist. No oropharyngeal exudate.  Eyes: Pupils are equal, round, and reactive to light. No scleral icterus.  Neck: Normal range of motion. Neck supple.  Cardiovascular: Normal rate, regular rhythm and normal heart sounds.   Pulmonary/Chest: Effort normal and breath sounds normal.  No respiratory distress.  Abdominal: Soft. Bowel sounds are normal. She exhibits no distension. There is no tenderness.  Musculoskeletal: She exhibits no edema.  Lymphadenopathy:    She has no cervical adenopathy.  Neurological: She is alert and oriented to person, place, and time.  NO FOCAL DEFICITS   Psychiatric: She has a normal mood and affect.  Vitals reviewed.         Assessment & Plan:

## 2013-12-28 NOTE — Progress Notes (Signed)
REVIEWED.  

## 2013-12-28 NOTE — Assessment & Plan Note (Signed)
MOST LIKELY DUE TO HEMORRHOIDS-PT LIKELY HAS INTERNAL AND EXTERNAL HEMORRHOIDS.  PER PT REQUEST, FLEX SIG/?HEMORHROID BANDING IN JAN 2016.  DISCUSSED MANAGEMENT OF HEMORRHOIDS AND RISKS OF USING STEROIDS CREAM ON A DAILY BASIS: ADRENAL SUPPRESSION/CRISIS.[name] DISCUSSED PROCEDURE, BENEFITS, & RISKS: < 1% chance of medication reaction, bleeding, perforation, or PELVIC VEIN SEPSIS.

## 2013-12-28 NOTE — Assessment & Plan Note (Signed)
SX NOT IDEALLY CONTROLLED. COUGH DUE TO ?ATYPICAL REFLUX OR UNCONTROLLED NON-ACID REFLUX.  DEXILANT BID LOW FAT DIET AVOID TRIGGERS ADD BACLOFEN 10 MG 30 MINS PRIOR TO BREAKFAST AND AT BEDTIME. FOLLOW UP IN 4 MOS.

## 2014-01-01 NOTE — Progress Notes (Signed)
ON RECALL LIST  °

## 2014-01-11 NOTE — Progress Notes (Signed)
cc'ed to pcp °

## 2014-01-18 ENCOUNTER — Ambulatory Visit (HOSPITAL_COMMUNITY)
Admission: RE | Admit: 2014-01-18 | Discharge: 2014-01-18 | Disposition: A | Discharge status: Home / Self Care | Payer: Medicare Other | Source: Ambulatory Visit | Attending: Gastroenterology | Admitting: Gastroenterology

## 2014-01-18 ENCOUNTER — Encounter (HOSPITAL_COMMUNITY): Payer: Self-pay | Admitting: Speech Pathology

## 2014-01-18 DIAGNOSIS — R1314 Dysphagia, pharyngoesophageal phase: Secondary | ICD-10-CM | POA: Diagnosis present

## 2014-01-18 DIAGNOSIS — R05 Cough: Secondary | ICD-10-CM | POA: Insufficient documentation

## 2014-01-18 DIAGNOSIS — R053 Chronic cough: Secondary | ICD-10-CM

## 2014-01-18 NOTE — Therapy (Signed)
Ellen Bowers, [place], [address] Phone: [phone]   Fax:  [phone]  Modified Barium Swallow  Patient Details  Name: Ellen Bowers MRN: [id number] Date of Birth: May 01, 1945 Referring Provider:  Danie Binder, MD  Encounter Date: 01/18/2014      End of Session - 01/18/14 1826    Visit Number 1   Number of Visits 1   Authorization Type Medicare   SLP Start Time 1093   SLP Stop Time  2355   SLP Time Calculation (min) 33 min   Activity Tolerance Patient tolerated treatment well      Past Medical History  Diagnosis Date  . Asthma   . COPD (chronic obstructive pulmonary disease)   . GERD (gastroesophageal reflux disease)   . Arthritis   . Von Willebrand disease 1996  . Helicobacter pylori gastritis 2005    treated    Past Surgical History  Procedure Laterality Date  . Tubal ligation  1984  . Neck surgery  1951    cyst removal  . Tonsillectomy  1951  . Cataract extraction      double  . Colonoscopy N/A 05/30/2012    Dr.Mann:Multiple polyps removed from the right colon-see description above; otherwise normal colonoscopy up to the cecum. 5 polyps, multiple tubular adenomas. next tcs 05/2015  . Neck surgery  1998    growth removed  . Esophagogastroduodenoscopy  2012    Dr. Collene Mares: few erosions in duodenal bulb. BRAVO off PPI ("severe reflux")    There were no vitals taken for this visit.  Visit Diagnosis: Dysphagia, pharyngoesophageal phase      Subjective Assessment - 01/18/14 1822    Symptoms Pt complains of coughing after meals. "People think that I am sick."   Special Tests MBSS   Currently in Pain? No/denies           General - 01/18/14 1823    General Information   Date of Onset 01/12/14   HPI Mrs. Ellen Bowers is a 69 yo woman who was referred by Dr. Oneida Alar for MBSS due to pt complaint of coughing after meals. Her past medical history is significant for asthma, COPD, GERD,  arthritis, Von Willebrand disease (blood clotting), and Helicobacter pylori gastritis. She had a barium swallow completed 08/23/2012 which showed no aspiration, prominent CP, mild tertiary contractions, and small hiatal hernia. Pt tells SLP that she has "severe reflux" and now takes Dexilant twice per day (not necessarily 30 minutes before eating in AM) and augments with maalox. She tried elevating the head of her bed, but it bothered her husband so she switched to a wedge which bothered her hemorroids. She drinks 2 cups of coffee in the morning. She tells SLP that the coughing is usually after meals and also in the middle of the night. Pt also with post nasal drip. She denies feeling like she is choking.    Type of Study Modified Barium Swallowing Study   Reason for Referral Objectively evaluate swallowing function   Previous Swallow Assessment No MBSS on record; barium swallow 08/23/2012   Diet Prior to this Study Regular;Thin liquids   Temperature Spikes Noted No   Respiratory Status Room air   Behavior/Cognition Alert;Cooperative;Pleasant mood   Oral Cavity - Dentition Adequate natural dentition   Oral Motor / Sensory Function Within functional limits   Self-Feeding Abilities Able to feed self   Patient Positioning Upright in chair   Baseline Vocal Quality Clear;Hoarse  mildly hoarse  Volitional Cough Strong   Volitional Swallow Able to elicit   Anatomy Within functional limits   Pharyngeal Secretions Not observed secondary MBS            Oral Preparation/Oral Phase - 2014-01-29 1824    Oral Preparation/Oral Phase   Oral Phase [facility]   Electrical stimulation - Oral Phase   Was Electrical Stimulation Used No          Pharyngeal Phase - 2014-01-29 1824    Pharyngeal Phase   Pharyngeal Phase Within functional limits   Electrical Stimulation - Pharyngeal Phase   Was Electrical Stimulation Used No          Cricopharyngeal Phase - January 29, 2014 1824    Cervical Esophageal Phase    Cervical Esophageal Phase Impaired   Cervical Esophageal Phase - Thin   Thin Straw Prominent cricopharyngeal segment           Plan - 01-29-14 1826    Clinical Impression Statement Oropharyngeal swallow is essentially WNL for consistencies presented (puree, thin, regular, barium tablet, and mixed consistencies). She had mild vallecular residue when consuming regular textures which cleared with liquid wash. Pt throws her head back to take pills and did so today with barium tablet. Pt with coughing episode immediately following pill, however no penetration or aspiration was seen. Pt further challenged with mixed textures and straw sips thing which also elicited one cough, but no penetration or aspiration.   Esophageal sweep was remarkable for prominent cricopharyngeus; only minimal delay of emptying in distal esophagus. I spoke with pt regarding reflux precautions (elevating HOB- start slowly so husband can ease into the change, take Dexilant immediately upon waking and 2 hours  after her last meal of the day, make efforts to replace cough/throat clear with an effortful swallow and/or sip of water, avoid caffeine and fried/fatty foods) as this may be the reason for her cough (in combination with asthma, medications, and post nasal drip). Pt voiced concerns about malabsorption of her B vitamins while taking PPI and she was encouraged to discuss with her doctor. No further SLP services indicated at this time.    Consulted and Agree with Plan of Care Patient          G-Codes - Jan 29, 2014 1836    Functional Assessment Tool Used MBSS; clinical judgement   Functional Limitations Swallowing   Swallow Current Status (M8413) 0 percent impaired, limited or restricted   Swallow Goal Status (K4401) 0 percent impaired, limited or restricted   Swallow Discharge Status [phone]) 0 percent impaired, limited or restricted          Recommendations/Treatment - 2014-01-29 1825    Swallow Evaluation  Recommendations   Diet Recommendations Regular;Thin liquid   Liquid Administration via Cup;Straw   Medication Administration Whole meds with liquid   Supervision Patient able to self feed   Postural Changes and/or Swallow Maneuvers Upright 30-60 min after meal;Seated upright 90 degrees   Oral Care Recommendations Oral care BID   Other Recommendations Clarify dietary restrictions   Follow up Recommendations None          Prognosis - 2014/01/29 1826    Prognosis   Prognosis for Safe Diet Advancement Good   Individuals Consulted   Consulted and Agree with Results and Recommendations Patient   Report Sent to  Referring physician      Problem List Patient Active Problem List   Diagnosis Date Noted  . Rectal pain, chronic 12/28/2013  . GERD (gastroesophageal reflux disease) 09/06/2013  .  Chronic cough 09/06/2013   Thank you,  Genene Churn, [place]  [facility] 01/18/2014, 6:37 PM  [facility] [address] [place], [place], [address] Phone: [phone]   Fax:  [phone]

## 2014-02-05 ENCOUNTER — Encounter (HOSPITAL_COMMUNITY): Payer: Self-pay | Admitting: *Deleted

## 2014-02-05 ENCOUNTER — Ambulatory Visit (HOSPITAL_COMMUNITY)
Admission: RE | Admit: 2014-02-05 | Discharge: 2014-02-05 | Disposition: A | Discharge status: Home / Self Care | Payer: Medicare Other | Source: Ambulatory Visit | Attending: Gastroenterology | Admitting: Gastroenterology

## 2014-02-05 ENCOUNTER — Encounter (HOSPITAL_COMMUNITY)
Admission: RE | Disposition: A | Discharge status: Home / Self Care | Payer: Self-pay | Source: Ambulatory Visit | Attending: Gastroenterology

## 2014-02-05 DIAGNOSIS — K219 Gastro-esophageal reflux disease without esophagitis: Secondary | ICD-10-CM | POA: Insufficient documentation

## 2014-02-05 DIAGNOSIS — Z800 Family history of malignant neoplasm of digestive organs: Secondary | ICD-10-CM | POA: Insufficient documentation

## 2014-02-05 DIAGNOSIS — Z7982 Long term (current) use of aspirin: Secondary | ICD-10-CM | POA: Insufficient documentation

## 2014-02-05 DIAGNOSIS — J449 Chronic obstructive pulmonary disease, unspecified: Secondary | ICD-10-CM | POA: Diagnosis not present

## 2014-02-05 DIAGNOSIS — K6289 Other specified diseases of anus and rectum: Secondary | ICD-10-CM

## 2014-02-05 DIAGNOSIS — D125 Benign neoplasm of sigmoid colon: Secondary | ICD-10-CM | POA: Diagnosis not present

## 2014-02-05 DIAGNOSIS — Z87891 Personal history of nicotine dependence: Secondary | ICD-10-CM | POA: Diagnosis not present

## 2014-02-05 DIAGNOSIS — Z79899 Other long term (current) drug therapy: Secondary | ICD-10-CM | POA: Insufficient documentation

## 2014-02-05 DIAGNOSIS — K602 Anal fissure, unspecified: Secondary | ICD-10-CM | POA: Diagnosis not present

## 2014-02-05 DIAGNOSIS — K625 Hemorrhage of anus and rectum: Secondary | ICD-10-CM | POA: Insufficient documentation

## 2014-02-05 DIAGNOSIS — K648 Other hemorrhoids: Secondary | ICD-10-CM | POA: Insufficient documentation

## 2014-02-05 DIAGNOSIS — K644 Residual hemorrhoidal skin tags: Secondary | ICD-10-CM | POA: Diagnosis not present

## 2014-02-05 HISTORY — PX: FLEXIBLE SIGMOIDOSCOPY: SHX5431

## 2014-02-05 SURGERY — SIGMOIDOSCOPY, FLEXIBLE
Anesthesia: Moderate Sedation

## 2014-02-05 MED ORDER — NITROGLYCERIN 0.4 % RE OINT: 1.0000 [drp] | TOPICAL_OINTMENT | Freq: Three times a day (TID) | RECTAL | Status: DC

## 2014-02-05 MED ORDER — PROMETHAZINE HCL 25 MG/ML IJ SOLN
12.5000 mg | Freq: Once | INTRAMUSCULAR | Status: AC
  Administered 2014-02-05: 12.5 mg via INTRAVENOUS

## 2014-02-05 MED ORDER — PROMETHAZINE HCL 25 MG/ML IJ SOLN
INTRAMUSCULAR | Status: AC
  Filled 2014-02-05: qty 1

## 2014-02-05 MED ORDER — SODIUM CHLORIDE 0.9 % IJ SOLN
INTRAMUSCULAR | Status: AC
  Filled 2014-02-05: qty 3

## 2014-02-05 MED ORDER — MEPERIDINE HCL 100 MG/ML IJ SOLN
INTRAMUSCULAR | Status: AC
  Filled 2014-02-05: qty 2

## 2014-02-05 MED ORDER — FENTANYL CITRATE 0.05 MG/ML IJ SOLN
INTRAMUSCULAR | Status: DC | PRN
  Administered 2014-02-05: 50 ug via INTRAVENOUS
  Administered 2014-02-05: 25 ug via INTRAVENOUS

## 2014-02-05 MED ORDER — STERILE WATER FOR IRRIGATION IR SOLN
Status: DC | PRN
  Administered 2014-02-05: 09:00:00

## 2014-02-05 MED ORDER — HYDROCORTISONE 2.5 % RE CREA: 1.0000 "application " | TOPICAL_CREAM | Freq: Two times a day (BID) | RECTAL | Status: DC

## 2014-02-05 MED ORDER — MIDAZOLAM HCL 5 MG/5ML IJ SOLN
INTRAMUSCULAR | Status: AC
  Filled 2014-02-05: qty 10

## 2014-02-05 MED ORDER — MIDAZOLAM HCL 5 MG/5ML IJ SOLN
INTRAMUSCULAR | Status: DC | PRN
  Administered 2014-02-05: 2 mg via INTRAVENOUS
  Administered 2014-02-05 (×2): 1 mg via INTRAVENOUS

## 2014-02-05 MED ORDER — FENTANYL CITRATE 0.05 MG/ML IJ SOLN
INTRAMUSCULAR | Status: DC
Start: 2014-02-05 — End: 2014-02-05
  Filled 2014-02-05: qty 2

## 2014-02-05 MED ORDER — SODIUM CHLORIDE 0.9 % IV SOLN
INTRAVENOUS | Status: DC
  Administered 2014-02-05: 08:00:00 via INTRAVENOUS

## 2014-02-05 NOTE — H&P (Signed)
Primary Care Physician:  [name], MD Primary Gastroenterologist:  Dr. Oneida Alar  Pre-Procedure History & Physical: HPI:  Ellen Bowers is a 69 y.o. female here for RECTAL BLEEDING/pain.  Past Medical History  Diagnosis Date  . Asthma   . COPD (chronic obstructive pulmonary disease)   . GERD (gastroesophageal reflux disease)   . Arthritis   . Von Willebrand disease 1996  . Helicobacter pylori gastritis 2005    treated    Past Surgical History  Procedure Laterality Date  . Tubal ligation  1984  . Neck surgery  1951    cyst removal  . Tonsillectomy  1951  . Cataract extraction      double  . Colonoscopy N/A 05/30/2012    Dr.Mann:Multiple polyps removed from the right colon-see description above; otherwise normal colonoscopy up to the cecum. 5 polyps, multiple tubular adenomas. next tcs 05/2015  . Neck surgery  1998    growth removed  . Esophagogastroduodenoscopy  2012    Dr. Collene Mares: few erosions in duodenal bulb. BRAVO off PPI ("severe reflux")    Prior to Admission medications   Medication Sig Start Date End Date Taking? Authorizing Provider  albuterol (PROVENTIL HFA;VENTOLIN HFA) 108 (90 BASE) MCG/ACT inhaler Inhale 2 puffs into the lungs every 6 (six) hours as needed for wheezing or shortness of breath.   Yes Historical Provider, MD  albuterol (PROVENTIL) (2.5 MG/3ML) 0.083% nebulizer solution Take 2.5 mg by nebulization every 6 (six) hours as needed for wheezing.   Yes Historical Provider, MD  Ascorbic Acid (VITAMIN C) 1000 MG tablet Take 1,000 mg by mouth daily.   Yes Historical Provider, MD  aspirin 81 MG tablet Take 81 mg by mouth daily.   Yes Historical Provider, MD  azithromycin (ZITHROMAX) 250 MG tablet Take 250-500 mg by mouth daily. Starting 01/21/13 x 5 days. (zpak)   Yes Historical Provider, MD  buPROPion (WELLBUTRIN SR) 150 MG 12 hr tablet Take 150 mg by mouth 2 (two) times daily.   Yes Historical Provider, MD  celecoxib (CELEBREX) 200 MG capsule Take 200 mg  by mouth 2 (two) times daily.   Yes Historical Provider, MD  cetirizine (ZYRTEC) 10 MG tablet Take 10 mg by mouth daily.   Yes Historical Provider, MD  ciprofloxacin (CIPRO) 500 MG tablet Take 500 mg by mouth 2 (two) times daily.   Yes Historical Provider, MD  dexlansoprazole (DEXILANT) 60 MG capsule Take 1 capsule (60 mg total) by mouth 2 (two) times daily. 12/28/13  Yes Danie Binder, MD  dextromethorphan-guaiFENesin [facility] DM) 30-600 MG per 12 hr tablet Take 1 tablet by mouth 2 (two) times daily.   Yes Historical Provider, MD  Fluticasone-Salmeterol (ADVAIR) 250-50 MCG/DOSE AEPB Inhale 1 puff into the lungs every 12 (twelve) hours.   Yes Historical Provider, MD  gentamicin (GARAMYCIN) 0.3 % ophthalmic ointment Place 1 application into the right eye every 4 (four) hours.   Yes Historical Provider, MD  montelukast (SINGULAIR) 10 MG tablet Take 10 mg by mouth at bedtime.   Yes Historical Provider, MD  pravastatin (PRAVACHOL) 20 MG tablet Take 20 mg by mouth daily.   Yes Historical Provider, MD  saccharomyces boulardii (FLORASTOR) 250 MG capsule Take 250 mg by mouth 2 (two) times daily.   Yes Historical Provider, MD  solifenacin (VESICARE) 5 MG tablet Take 5 mg by mouth daily.   Yes Historical Provider, MD  zafirlukast (ACCOLATE) 20 MG tablet Take 20 mg by mouth 2 (two) times daily.   Yes Historical Provider, MD  baclofen (LIORESAL) 10 MG tablet 1 PO 30 MINUTES PRIOR TO MEALS TID AND AT BEDTIME Patient not taking: Reported on 01/25/2014 12/28/13   Danie Binder, MD    Allergies as of 12/28/2013 - Review Complete 12/28/2013  Allergen Reaction Noted  . Betadine [povidone iodine] Rash 05/30/2012    Family History  Problem Relation Age of Onset  . Colon cancer Mother     less than age 50  . Colon cancer Father     more than age 74  . Liver disease Neg Hx     History   Social History  . Marital Status: Married    Spouse Name: N/A    Number of Children: N/A  . Years of Education: N/A    Occupational History  . Not on file.   Social History Main Topics  . Smoking status: Former Smoker -- 1.00 packs/day for 50 years    Types: Cigarettes    Quit date: 05/30/2009  . Smokeless tobacco: Not on file     Comment: Quit smoking x 5 years  . Alcohol Use: Yes     Comment: occasionally    . Drug Use: No  . Sexual Activity: Not on file   Other Topics Concern  . Not on file   Social History Narrative    Review of Systems: See HPI, otherwise negative ROS   Physical Exam: BP 151/83 mmHg  Pulse 91  Temp(Src) 98.2 F (36.8 C) (Oral)  Resp 18  Ht 5' 9.25" (1.759 m)  Wt 205 lb (92.987 kg)  BMI 30.05 kg/m2  SpO2 95% General:   Alert,  pleasant and cooperative in NAD Head:  Normocephalic and atraumatic. Neck:  Supple; Lungs:  Clear throughout to auscultation.    Heart:  Regular rate and rhythm. Abdomen:  Soft, nontender and nondistended. Normal bowel sounds, without guarding, and without rebound.   Neurologic:  Alert and  oriented x4;  grossly normal neurologically.  Impression/Plan:     RECTAL BLEEDING/pain  PLAN:  1. FLEX SIG-BANDING TODAY

## 2014-02-05 NOTE — Discharge Instructions (Signed)
You had 5 SMALL polyps removed. YOUR RECTAL PAIN AND BLEEDING IS MOST LIKELY DUE TO AN ANAL FISSURE. You have SMALL INTERNAL HEMORRHOIDS AND MODERATE SIZE EXTERNAL hemorrhoids.    AVOID CONSTIPATION. SEE INFO BELOW.  USE COLACE 100 MG THREE TIMES A DAY TO SOFTEN STOOLS.  DRINK WATER TO KEEP YOUR URINE LIGHT YELLOW.  Use NITROGLYCERIN ointment 0.4% three times a day for 3 MOS.  IN 2 WEEKS, START PROCTOSOL CREAM BID FOR 14 DAYS.  TRY TO REDUCE DEXILANT TO ONCE A DAY IN THE MORNING.  FOLLOW A LOW FAT DIET. AVOID ITEMS THAT TRIGGER REFLUX.  SEE INFO BELOW.  SIT FOR LESS THAN 2 MINUTES ON THE TOILET.  FOLLOW UP IN APR 2016.    ENDOSCOPY Care After Read the instructions outlined below and refer to this sheet in the next week. These discharge instructions provide you with general information on caring for yourself after you leave the hospital. While your treatment has been planned according to the most current medical practices available, unavoidable complications occasionally occur. If you have any problems or questions after discharge, call DR. Nanna Ertle, [phone].  ACTIVITY  You may resume your regular activity, but move at a slower pace for the next 24 hours.   Take frequent rest periods for the next 24 hours.   Walking will help get rid of the air and reduce the bloated feeling in your belly (abdomen).   No driving for 24 hours (because of the medicine (anesthesia) used during the test).   You may shower.   Do not sign any important legal documents or operate any machinery for 24 hours (because of the anesthesia used during the test).    NUTRITION  Drink plenty of fluids.   You may resume your normal diet as instructed by your doctor.   Begin with a light meal and progress to your normal diet. Heavy or fried foods are harder to digest and may make you feel sick to your stomach (nauseated).   Avoid alcoholic beverages for 24 hours or as instructed.     MEDICATIONS  You may resume your normal medications.   WHAT YOU CAN EXPECT TODAY  Some feelings of bloating in the abdomen.   Passage of more gas than usual.   Spotting of blood in your stool or on the toilet paper  .  IF YOU HADBIOPSIES TAKEN  DURING THE SIGMOIDOSCOPY/UPPER ENDOSCOPY:  Eat a soft diet IF YOU HAVE NAUSEA, BLOATING, ABDOMINAL PAIN, OR VOMITING.    FINDING OUT THE RESULTS OF YOUR TEST Not all test results are available during your visit. DR. Oneida Alar WILL CALL YOU WITHIN 14 DAYS OF YOUR PROCEDUE WITH YOUR RESULTS. Do not assume everything is normal if you have not heard from DR. Brigett Estell, CALL HER OFFICE AT [phone].  SEEK IMMEDIATE MEDICAL ATTENTION AND CALL THE OFFICE: [phone] IF:  You have more than a spotting of blood in your stool.   Your belly is swollen (abdominal distention).   You are nauseated or vomiting.   You have a temperature over 101F.   You have abdominal pain or discomfort that is severe or gets worse throughout the day.   Anal Fissure, Adult  An anal fissure is a small tear or crack in the skin around the anus. Bleeding from a fissure usually stops on its own within a few minutes. However, bleeding will often reoccur with each bowel movement until the crack heals.   CAUSES  Passing large, hard stools.  Frequent diarrheal stools.  Constipation.   SYMPTOMS  Small amounts of blood seen on your stools, on toilet paper, or in the toilet after a bowel movement.  Rectal bleeding.  Painful bowel movements.  Itching or irritation around the anus.  TREATMENT  You may be instructed to take fiber supplements. These supplements can soften your stool to help make bowel movements easier.  Sitz baths may be recommended to help heal the tear. Do not use soap in the sitz baths.  A medicated cream or ointment may be prescribed to lessen discomfort.  HOME CARE INSTRUCTIONS  Maintain a diet high in fruits, whole grains, and vegetables.  Avoid constipating foods like bananas and dairy products.  Take sitz baths as directed by your caregiver.  Drink enough fluids to keep your urine clear or pale yellow.   Lifestyle and home remedies TO MANAGE REFLUX  You may eliminate or reduce the frequency of heartburn by making the following lifestyle changes:   Control your weight. Being overweight is a major risk factor for heartburn and GERD. Excess pounds put pressure on your abdomen, pushing up your stomach and causing acid to back up into your esophagus.    Eat smaller meals. 4 TO 6 MEALS A DAY. This reduces pressure on the lower esophageal sphincter, helping to prevent the valve from opening and acid from washing back into your esophagus.    Loosen your belt. Clothes that fit tightly around your waist put pressure on your abdomen and the lower esophageal sphincter.    Eliminate heartburn triggers. Everyone has specific triggers. Common triggers such as fatty or fried foods, spicy food, tomato sauce, carbonated beverages, alcohol, chocolate, mint, garlic, onion, caffeine and nicotine may make heartburn worse.    Avoid stooping or bending. Tying your shoes is OK. Bending over for longer periods to weed your garden isn't, especially soon after eating.    Don't lie down after a meal. Wait at least three to four hours after eating before going to bed, and don't lie down right after eating.    PUT THE HEAD OF YOUR BED ON 6 INCH BLOCKS.   Low-Fat Diet BREADS, CEREALS, PASTA, RICE, DRIED PEAS, AND BEANS These products are high in carbohydrates and most are low in fat. Therefore, they can be increased in the diet as substitutes for fatty foods. They too, however, contain calories and should not be eaten in excess. Cereals can be eaten for snacks as well as for breakfast.   FRUITS AND VEGETABLES It is good to eat fruits and vegetables. Besides being sources of fiber, both are rich in vitamins and some minerals. They help you get  the daily allowances of these nutrients. Fruits and vegetables can be used for snacks and desserts.  MEATS Limit lean meat, chicken, [place], and fish to no more than 6 ounces per day. Beef, Pork, and Lamb Use lean cuts of beef, pork, and lamb. Lean cuts include:  Extra-lean ground beef.  Arm roast.  Sirloin tip.  Center-cut ham.  Round steak.  Loin chops.  Rump roast.  Tenderloin.  Trim all fat off the outside of meats before cooking. It is not necessary to severely decrease the intake of red meat, but lean choices should be made. Lean meat is rich in protein and contains a highly absorbable form of iron. Premenopausal women, in particular, should avoid reducing lean red meat because this could increase the risk for low red blood cells (iron-deficiency anemia).  Chicken and [place] These are good sources of protein. The  fat of poultry can be reduced by removing the skin and underlying fat layers before cooking. Chicken and [place] can be substituted for lean red meat in the diet. Poultry should not be fried or covered with high-fat sauces. Fish and Shellfish Fish is a good source of protein. Shellfish contain cholesterol, but they usually are low in saturated fatty acids. The preparation of fish is important. Like chicken and [place], they should not be fried or covered with high-fat sauces. EGGS Egg whites contain no fat or cholesterol. They can be eaten often. Try 1 to 2 egg whites instead of whole eggs in recipes or use egg substitutes that do not contain yolk. MILK AND DAIRY PRODUCTS Use skim or 1% milk instead of 2% or whole milk. Decrease whole milk, natural, and processed cheeses. Use nonfat or low-fat (2%) cottage cheese or low-fat cheeses made from vegetable oils. Choose nonfat or low-fat (1 to 2%) yogurt. Experiment with evaporated skim milk in recipes that call for heavy cream. Substitute low-fat yogurt or low-fat cottage cheese for sour cream in dips and salad dressings. Have at least  2 servings of low-fat dairy products, such as 2 glasses of skim (or 1%) milk each day to help get your daily calcium intake. FATS AND OILS Reduce the total intake of fats, especially saturated fat. Butterfat, lard, and beef fats are high in saturated fat and cholesterol. These should be avoided as much as possible. Vegetable fats do not contain cholesterol, but certain vegetable fats, such as coconut oil, palm oil, and palm kernel oil are very high in saturated fats. These should be limited. These fats are often used in bakery goods, processed foods, popcorn, oils, and nondairy creamers. Vegetable shortenings and some peanut butters contain hydrogenated oils, which are also saturated fats. Read the labels on these foods and check for saturated vegetable oils. Unsaturated vegetable oils and fats do not raise blood cholesterol. However, they should be limited because they are fats and are high in calories. Total fat should still be limited to 30% of your daily caloric intake. Desirable liquid vegetable oils are corn oil, cottonseed oil, olive oil, canola oil, safflower oil, soybean oil, and sunflower oil. Peanut oil is not as good, but small amounts are acceptable. Buy a heart-healthy tub margarine that has no partially hydrogenated oils in the ingredients. Mayonnaise and salad dressings often are made from unsaturated fats, but they should also be limited because of their high calorie and fat content. Seeds, nuts, peanut butter, olives, and avocados are high in fat, but the fat is mainly the unsaturated type. These foods should be limited mainly to avoid excess calories and fat. OTHER EATING TIPS Snacks  Most sweets should be limited as snacks. They tend to be rich in calories and fats, and their caloric content outweighs their nutritional value. Some good choices in snacks are graham crackers, melba toast, soda crackers, bagels (no egg), English muffins, fruits, and vegetables. These snacks are preferable to  snack crackers, [place] fries, TORTILLA CHIPS, and POTATO chips. Popcorn should be air-popped or cooked in small amounts of liquid vegetable oil. Desserts Eat fruit, low-fat yogurt, and fruit ices instead of pastries, cake, and cookies. Sherbet, angel food cake, gelatin dessert, frozen low-fat yogurt, or other frozen products that do not contain saturated fat (pure fruit juice bars, frozen ice pops) are also acceptable.  COOKING METHODS Choose those methods that use little or no fat. They include: Poaching.  Braising.  Steaming.  Grilling.  Baking.  Stir-frying.  Broiling.  Microwaving.  Foods can be cooked in a nonstick pan without added fat, or use a nonfat cooking spray in regular cookware. Limit fried foods and avoid frying in saturated fat. Add moisture to lean meats by using water, broth, cooking wines, and other nonfat or low-fat sauces along with the cooking methods mentioned above. Soups and stews should be chilled after cooking. The fat that forms on top after a few hours in the refrigerator should be skimmed off. When preparing meals, avoid using excess salt. Salt can contribute to raising blood pressure in some people.  EATING AWAY FROM HOME Order entres, potatoes, and vegetables without sauces or butter. When meat exceeds the size of a deck of cards (3 to 4 ounces), the rest can be taken home for another meal. Choose vegetable or fruit salads and ask for low-calorie salad dressings to be served on the side. Use dressings sparingly. Limit high-fat toppings, such as bacon, crumbled eggs, cheese, sunflower seeds, and olives. Ask for heart-healthy tub margarine instead of butter.    Constipation in Adults Constipation is having fewer than 2 bowel movements per week. Usually, the stools are hard. As we grow older, constipation is more common. If you try to fix constipation with laxatives, the problem may get worse. This is because laxatives taken over a long period of time make the  colon muscles weaker. A low-fiber diet, not taking in enough fluids, and taking some medicines may make these problems worse.   HOME CARE INSTRUCTIONS  Constipation is usually best cared for without medicines. Increasing dietary fiber and eating more fruits and vegetables is the best way to manage constipation.   Slowly increase fiber intake to 25 to 38 grams per day. Whole grains, fruits, vegetables, and legumes are good sources of fiber. A dietitian can further help you incorporate high-fiber foods into your diet.   Drink enough water and fluids to keep your urine clear or pale yellow.   A fiber supplement may be added to your diet if you cannot get enough fiber from foods.   Increasing your activities also helps improve regularity.   Stronger measures, such as magnesium sulfate, should be avoided if possible. This may cause uncontrollable diarrhea. Using magnesium sulfate may not allow you time to make it to the bathroom.    Low-Fat Diet BREADS, CEREALS, PASTA, RICE, DRIED PEAS, AND BEANS These products are high in carbohydrates and most are low in fat. Therefore, they can be increased in the diet as substitutes for fatty foods. They too, however, contain calories and should not be eaten in excess. Cereals can be eaten for snacks as well as for breakfast.  Include foods that contain fiber (fruits, vegetables, whole grains, and legumes). Research shows that fiber may lower blood cholesterol levels, especially the water-soluble fiber found in fruits, vegetables, oat products, and legumes. FRUITS AND VEGETABLES It is good to eat fruits and vegetables. Besides being sources of fiber, both are rich in vitamins and some minerals. They help you get the daily allowances of these nutrients. Fruits and vegetables can be used for snacks and desserts. MEATS Limit lean meat, chicken, [place], and fish to no more than 6 ounces per day. Beef, Pork, and Lamb Use lean cuts of beef, pork, and lamb. Lean  cuts include:  Extra-lean ground beef.  Arm roast.  Sirloin tip.  Center-cut ham.  Round steak.  Loin chops.  Rump roast.  Tenderloin.  Trim all fat off the outside of meats before cooking. It  is not necessary to severely decrease the intake of red meat, but lean choices should be made. Lean meat is rich in protein and contains a highly absorbable form of iron. Premenopausal women, in particular, should avoid reducing lean red meat because this could increase the risk for low red blood cells (iron-deficiency anemia).  Chicken and [place] These are good sources of protein. The fat of poultry can be reduced by removing the skin and underlying fat layers before cooking. Chicken and [place] can be substituted for lean red meat in the diet. Poultry should not be fried or covered with high-fat sauces. Fish and Shellfish Fish is a good source of protein. Shellfish contain cholesterol, but they usually are low in saturated fatty acids. The preparation of fish is important. Like chicken and [place], they should not be fried or covered with high-fat sauces. EGGS Egg whites contain no fat or cholesterol. They can be eaten often. Try 1 to 2 egg whites instead of whole eggs in recipes or use egg substitutes that do not contain yolk.  MILK AND DAIRY PRODUCTS Use skim or 1% milk instead of 2% or whole milk. Decrease whole milk, natural, and processed cheeses. Use nonfat or low-fat (2%) cottage cheese or low-fat cheeses made from vegetable oils. Choose nonfat or low-fat (1 to 2%) yogurt. Experiment with evaporated skim milk in recipes that call for heavy cream. Substitute low-fat yogurt or low-fat cottage cheese for sour cream in dips and salad dressings. Have at least 2 servings of low-fat dairy products, such as 2 glasses of skim (or 1%) milk each day to help get your daily calcium intake.  FATS AND OILS Butterfat, lard, and beef fats are high in saturated fat and cholesterol. These should be avoided.Vegetable  fats do not contain cholesterol. AVOID coconut oil, palm oil, and palm kernel oil, WHICH are very high in saturated fats. These should be limited. These fats are often used in bakery goods, processed foods, popcorn, oils, and nondairy creamers. Vegetable shortenings and some peanut butters contain hydrogenated oils, which are also saturated fats. Read the labels on these foods and check for saturated vegetable oils.  Desirable liquid vegetable oils are corn oil, cottonseed oil, olive oil, canola oil, safflower oil, soybean oil, and sunflower oil. Peanut oil is not as good, but small amounts are acceptable. Buy a heart-healthy tub margarine that has no partially hydrogenated oils in the ingredients. AVOID Mayonnaise and salad dressings often are made from unsaturated fats.  OTHER EATING TIPS Snacks  Most sweets should be limited as snacks. They tend to be rich in calories and fats, and their caloric content outweighs their nutritional value. Some good choices in snacks are graham crackers, melba toast, soda crackers, bagels (no egg), English muffins, fruits, and vegetables. These snacks are preferable to snack crackers, [place] fries, and chips. Popcorn should be air-popped or cooked in small amounts of liquid vegetable oil.  Desserts Eat fruit, low-fat yogurt, and fruit ices instead of pastries, cake, and cookies. Sherbet, angel food cake, gelatin dessert, frozen low-fat yogurt, or other frozen products that do not contain saturated fat (pure fruit juice bars, frozen ice pops) are also acceptable.   COOKING METHODS Choose those methods that use little or no fat. They include: Poaching.  Braising.  Steaming.  Grilling.  Baking.  Stir-frying.  Broiling.  Microwaving.  Foods can be cooked in a nonstick pan without added fat, or use a nonfat cooking spray in regular cookware. Limit fried foods and avoid frying in saturated  fat. Add moisture to lean meats by using water, broth, cooking wines, and  other nonfat or low-fat sauces along with the cooking methods mentioned above. Soups and stews should be chilled after cooking. The fat that forms on top after a few hours in the refrigerator should be skimmed off. When preparing meals, avoid using excess salt. Salt can contribute to raising blood pressure in some people.  EATING AWAY FROM HOME Order entres, potatoes, and vegetables without sauces or butter. When meat exceeds the size of a deck of cards (3 to 4 ounces), the rest can be taken home for another meal. Choose vegetable or fruit salads and ask for low-calorie salad dressings to be served on the side. Use dressings sparingly. Limit high-fat toppings, such as bacon, crumbled eggs, cheese, sunflower seeds, and olives. Ask for heart-healthy tub margarine instead of butter.   Polyps, Colon  A polyp is extra tissue that grows inside your body. Colon polyps grow in the large intestine. The large intestine, also called the colon, is part of your digestive system. It is a long, hollow tube at the end of your digestive tract where your body makes and stores stool. Most polyps are not dangerous. They are benign. This means they are not cancerous. But over time, some types of polyps can turn into cancer. Polyps that are smaller than a pea are usually not harmful. But larger polyps could someday become or may already be cancerous. To be safe, doctors remove all polyps and test them.   WHO GETS POLYPS? Anyone can get polyps, but certain people are more likely than others. You may have a greater chance of getting polyps if:  You are over 50.   You have had polyps before.   Someone in your family has had polyps.   Someone in your family has had cancer of the large intestine.   Find out if someone in your family has had polyps. You may also be more likely to get polyps if you:   Eat a lot of fatty foods   Smoke   Drink alcohol   Do not exercise  Eat too much   TREATMENT  The caregiver  will remove the polyp during sigmoidoscopy or colonoscopy.  PREVENTION There is not one sure way to prevent polyps. You might be able to lower your risk of getting them if you:  Eat more fruits and vegetables and less fatty food.   Do not smoke.   Avoid alcohol.   Exercise every day.   Lose weight if you are overweight.   Eating more calcium and folate can also lower your risk of getting polyps. Some foods that are rich in calcium are milk, cheese, and broccoli. Some foods that are rich in folate are chickpeas, kidney beans, and spinach.

## 2014-02-06 NOTE — Op Note (Signed)
Ellen Bowers, Ellen Bowers          ACCOUNT NO.:  [id number]  MEDICAL RECORD NO.:  [id number]  LOCATION:  [facility]                          FACILITY:  [facility]  PHYSICIAN:  Barney Drain, M.D.     DATE OF BIRTH:  1945-06-07  DATE OF PROCEDURE:  02/05/2014 DATE OF DISCHARGE:  02/05/2014                              OPERATIVE REPORT   REFERRING PROVIDER:  Dr. Manuella Ghazi.  PROCEDURE:  Flexible sigmoidoscopy with cold forceps polypectomy.  INDICATION FOR EXAM:  Ellen Bowers is a 69 year old female, who presented to the office with rectal bleeding and rectal pain.  FINDINGS: 1. Five, 2-3 mm sessile sigmoid colon polyps removed via cold forceps.     Otherwise, normal sigmoid mucosa. 2. Small internal hemorrhoids. 3. Moderate external hemorrhoids. 4. Two anal fissures present.  IMPRESSION: 1. Rectal pain and bleeding due to anal fissure and the pain may be     exacerbated by moderate external hemorrhoids. 2. Small internal hemorrhoids. 3. Five colon polyps removed.  RECOMMENDATIONS: 1. Avoid constipation. 2. Use Colace 100 mg 3 times a day to soften stool. 3. Drink water to keep urine light yellow. 4. Use nitroglycerin ointment 0.4% 3 times a day for 3 months. 5. IN TWO WEEKS, start Proctosol cream b.i.d. for 14 days. 6. Reduce Dexilant to once a day in the morning. 7. Follow a low-fat diet and avoid items saturated reflux. 8. SPEND less than 2 minutes on the toilet. 9. Follow up in April 2016.  MEDICATIONS: 1. Fentanyl 75 mcg IV. 2. Versed 4 mg IV.  PROCEDURE TECHNIQUE:  Physical exam was performed.  Informed consent was obtained from the patient after explaining the benefits, risks, and alternatives to the procedure.  The patient was connected to the monitor and placed in left lateral position.  Continuous oxygen was provided by nasal cannula and IV medicine administered with an indwelling cannula. After administration of sedation and rectal exam, the patient's rectum was intubated  and scope was advanced under direct visualization to the sigmoid colon (approximately 40 to 50 cm from the anal verge).  The scope was removed slowly by carefully examining the color, texture, anatomy, and integrity of the mucosa on the way out.  The patient has recovered in endoscopy, discharged home in satisfactory condition.  PATH: HYPERPLASTIC POLYPS. NEXT TCS IN 5 YEARS   Barney Drain, M.D.     SF/MEDQ  D:  02/06/2014  T:  02/06/2014  Job:  [id number]  cc:   Dr. Manuella Ghazi

## 2014-02-15 ENCOUNTER — Telehealth: Payer: Self-pay | Admitting: Gastroenterology

## 2014-02-15 NOTE — Telephone Encounter (Signed)
Please call pt. She had HYPERPLASTIC POLYPS removed.  AVOID CONSTIPATION.   USE COLACE 100 MG THREE TIMES A DAY TO SOFTEN STOOLS.  DRINK WATER TO KEEP YOUR URINE LIGHT YELLOW.  Use NITROGLYCERIN ointment 0.4% three times a day for 3 MOS.  START PROCTOSOL CREAM BID FOR 14 DAYS ON FEB 9.  TRY TO REDUCE DEXILANT TO ONCE A DAY IN THE MORNING.  FOLLOW A LOW FAT DIET. AVOID ITEMS THAT TRIGGER REFLUX.   SIT FOR LESS THAN 2 MINUTES ON THE TOILET.  FOLLOW UP IN APR 2016 E30 RECTAL PAIN/BLEEDING/ANAL FISSURES.

## 2014-02-16 NOTE — Telephone Encounter (Signed)
PATIENT SCHEDULED 04/2014

## 2014-02-20 ENCOUNTER — Encounter (HOSPITAL_COMMUNITY): Payer: Self-pay | Admitting: Gastroenterology

## 2014-02-20 LAB — PULMONARY FUNCTION TEST

## 2014-02-22 NOTE — Telephone Encounter (Signed)
Tried to call pt. Many rings and no answer. Mailing a letter to call.  

## 2014-02-22 NOTE — Telephone Encounter (Signed)
Pt called and was informed of results.  

## 2014-03-01 ENCOUNTER — Telehealth: Payer: Self-pay | Admitting: Gastroenterology

## 2014-03-01 NOTE — Telephone Encounter (Signed)
Pt was calling DS regarding the letter saying for her to call office about important information. Please call patient back at [phone]

## 2014-03-01 NOTE — Telephone Encounter (Signed)
Pt was made aware of her results on 02/22/2014. ( She is aware there is no new information).

## 2014-05-07 ENCOUNTER — Ambulatory Visit (INDEPENDENT_AMBULATORY_CARE_PROVIDER_SITE_OTHER): Payer: Medicare Other | Admitting: Gastroenterology

## 2014-05-07 ENCOUNTER — Encounter (INDEPENDENT_AMBULATORY_CARE_PROVIDER_SITE_OTHER): Payer: Self-pay

## 2014-05-07 ENCOUNTER — Encounter: Payer: Self-pay | Admitting: Gastroenterology

## 2014-05-07 VITALS — BP 144/81 | HR 88 | Temp 97.9°F | Ht 69.0 in | Wt 204.8 lb

## 2014-05-07 DIAGNOSIS — K649 Unspecified hemorrhoids: Secondary | ICD-10-CM | POA: Diagnosis not present

## 2014-05-07 DIAGNOSIS — R1013 Epigastric pain: Secondary | ICD-10-CM

## 2014-05-07 DIAGNOSIS — K219 Gastro-esophageal reflux disease without esophagitis: Secondary | ICD-10-CM

## 2014-05-07 NOTE — Progress Notes (Signed)
Primary Care Physician: [name], MD  Primary Gastroenterologist:  Barney Drain, MD   Chief Complaint  Patient presents with  . Follow-up    HPI: Ellen Bowers is a 69 y.o. female here for follow up. H/O hemorrhoids, anal fissure, GERD. Flexible sigmoidoscopy in January showed 2 anal fissures, moderate external hemorrhoids, small internal hemorrhoids, multiple hyperplastic polyps removed from sigmoid colon. Plans for follow-up colonoscopy in 5 years. Patient was given nitroglycerin 0.4% ointment 3 times a day for 3 months, 2 weeks of Proctosol, Colace 100 mg 3 times a day. Advised her drop back to Dexilant once daily back in February.  BM three times per day. Fissure healed up. Has hemorrhoid flare with bleeding every couple of weeks. Uses PrepH prn. Overall hemorrhoid issues stable. Stools are soft. Takes a lot of dietary fiber. +flatulence.  New symptoms, epigastric pain. Started over two months ago. Seems to happen after decreasing Dexilant from BID to once daily. Took a few weeks after this for symptoms to develop. Waking up first thing in the morning with nausea. Carafate helps, recently restarted, takes at bedtime and then when wakes up with symptoms.  Maalox/Mylanta at bedtime to control heartburn at night. Without it, has heartburn.  Increased stress. Gives h/o ulcer in the past, symptoms now not as bad as then. Tried Prevacid remotely but doesn't remember if helped. Nexium/Prilosec not as well as Dexilant. No melena. Usually no symptoms during the day. Pain not brought on by meal. Mild to moderate in severity.  Current Outpatient Prescriptions  Medication Sig Dispense Refill  . albuterol (PROVENTIL HFA;VENTOLIN HFA) 108 (90 BASE) MCG/ACT inhaler Inhale 2 puffs into the lungs every 6 (six) hours as needed for wheezing or shortness of breath.    [name] albuterol (PROVENTIL) (2.5 MG/3ML) 0.083% nebulizer solution Take 2.5 mg by nebulization every 6 (six) hours as needed for  wheezing.    [name] alum & mag hydroxide-simeth (MAALOX/MYLANTA) 200-200-20 MG/5ML suspension Take 15 mLs by mouth as needed for indigestion or heartburn.    . Ascorbic Acid (VITAMIN C) 1000 MG tablet Take 1,000 mg by mouth daily.    [name] aspirin 81 MG tablet Take 81 mg by mouth daily.    [name] buPROPion (WELLBUTRIN SR) 150 MG 12 hr tablet Take 150 mg by mouth 2 (two) times daily.    . celecoxib (CELEBREX) 200 MG capsule Take 200 mg by mouth 2 (two) times daily.    . cetirizine (ZYRTEC) 10 MG tablet Take 10 mg by mouth daily.    [name] dexlansoprazole (DEXILANT) 60 MG capsule Take 1 capsule (60 mg total) by mouth 2 (two) times daily. (Patient taking differently: Take 60 mg by mouth daily. ) 180 capsule 3  . dextromethorphan-guaiFENesin (MUCINEX DM) 30-600 MG per 12 hr tablet Take 1 tablet by mouth 2 (two) times daily.    . Fluticasone-Salmeterol (ADVAIR) 250-50 MCG/DOSE AEPB Inhale 1 puff into the lungs every 12 (twelve) hours.    . hydrocortisone (PROCTOSOL HC) 2.5 % rectal cream Place 1 application rectally 2 (two) times daily. START FEB 8 30 g 0  . Nitroglycerin 0.4 % OINT Place 1 drop rectally 3 (three) times daily. FOR 21 DAYS 15 g 1  . pravastatin (PRAVACHOL) 20 MG tablet Take 20 mg by mouth daily.    . solifenacin (VESICARE) 5 MG tablet Take 5 mg by mouth daily.    . sucralfate (CARAFATE) 1 G tablet Take 1 g by mouth 4 (four) times daily -  with  meals and at bedtime.     No current facility-administered medications for this visit.    Allergies as of 05/07/2014 - Review Complete 05/07/2014  Allergen Reaction Noted  . Betadine [povidone iodine] Rash 05/30/2012   Past Surgical History  Procedure Laterality Date  . Tubal ligation  1984  . Neck surgery  1951    cyst removal  . Tonsillectomy  1951  . Cataract extraction      double  . Colonoscopy N/A 05/30/2012    Dr.Mann:Multiple polyps removed from the right colon-see description above; otherwise normal colonoscopy up to the cecum. 5 polyps,  multiple tubular adenomas. next tcs 05/2015  . Neck surgery  1998    growth removed  . Esophagogastroduodenoscopy  2012    Dr. Collene Mares: few erosions in duodenal bulb. BRAVO off PPI ("severe reflux")  . Flexible sigmoidoscopy N/A 02/05/2014    Dr. Oneida Alar: 5, 2-3 mm sessile sigmoid colon polyps removed, small internal hemorrhoids, moderate external hemorrhoids, 2 anal fissures present. Polyps were hyperplastic. Next colonoscopy in 5 years.    ROS:  General: Negative for anorexia, weight loss, fever, chills, fatigue, weakness. ENT: Negative for hoarseness, difficulty swallowing , nasal congestion. CV: Negative for chest pain, angina, palpitations, dyspnea on exertion, peripheral edema.  Respiratory: Negative for dyspnea at rest, dyspnea on exertion, cough, sputum, wheezing.  GI: See history of present illness. GU:  Negative for dysuria, hematuria, urinary incontinence, urinary frequency, nocturnal urination.  Endo: Negative for unusual weight change.    Physical Examination:   BP 144/81 mmHg  Pulse 88  Temp(Src) 97.9 F (36.6 C)  Ht '5\' 9"'$  (1.753 m)  Wt 204 lb 12.8 oz (92.897 kg)  BMI 30.23 kg/m2  General: Well-nourished, well-developed in no acute distress.  Eyes: No icterus. Mouth: Oropharyngeal mucosa moist and pink , no lesions erythema or exudate. Lungs: Clear to auscultation bilaterally.  Heart: Regular rate and rhythm, no murmurs rubs or gallops.  Abdomen: Bowel sounds are normal, nontender, nondistended, no hepatosplenomegaly or masses, no abdominal bruits or hernia , no rebound or guarding.   Extremities: No lower extremity edema. No clubbing or deformities. Neuro: Alert and oriented x 4   Skin: Warm and dry, no jaundice.   Psych: Alert and cooperative, normal mood and affect.  Labs:  None available.  Imaging Studies: No results found.

## 2014-05-07 NOTE — Progress Notes (Signed)
cc'ed to pcp °

## 2014-05-07 NOTE — Patient Instructions (Signed)
1. Increase Dexilant back to '60mg'$  twice daily with food for two weeks. Call with a progress report at that time or sooner if symptoms worsening. We will decide if you need upper endoscopy or consider switching your medication at that time (Dexilant '30mg'$  twice daily if you are doing well). 2. You may continue Carafate as needed (no more than four times a day). 3. You may continue maalox/Mylanta at bedtime. 4. Start probiotic such as Tour manager. Take for at least two months to help with gas and bloating. 5. Return to see Dr. Oneida Alar in four months.

## 2014-05-07 NOTE — Assessment & Plan Note (Signed)
Continue to keep stools soft and avoid prolonged sitting on toilet. PrepH prn. Patient feels symptoms under adequate control and does not want to consider surgical treatment of external hemorrhoids or consider CRH banding of internal. Continue to monitor.

## 2014-05-07 NOTE — Assessment & Plan Note (Addendum)
New epigastric discomfort associated with nocturnal symptoms, nocturnal nausea. Heartburn adequately controlled. Symptoms developed after decreasing Dexilant from twice a day to once daily dosing. Not associated with meals. Differential diagnosis includes refractory GERD, gastritis, PUD.  Increased Dexilant 60 mg to twice a day for 2 weeks. She will call with progress report. If it continues to have ongoing symptoms, consider EGD. If symptoms adequately controlled, consider decreasing Dexilant to 30 mg twice a day. Patient does not want to continue 60 mg twice a day long-term which I agree with.   Patient to try probiotic for gas/bloating. Advised her to watch her diet to determine if any aggravating symptoms. She is eating a lot of high fiber cereals and bars that may be contributing to her symptoms.

## 2014-05-23 ENCOUNTER — Telehealth: Payer: Self-pay | Admitting: Gastroenterology

## 2014-05-23 NOTE — Telephone Encounter (Signed)
2 month h/o new epigastric pain. Uses NSAIDS daily. Dexilant BID not helping. Failed multiple PPIs in the past.   Recommend EGD with Dr. Oneida Alar.  Please note in comments on case request "patient had inadequate sedation with Versed/Demerol but did well with Fentanyl/Demerol. "

## 2014-05-23 NOTE — Telephone Encounter (Signed)
Pt said the Dexilant bid is not helping. She is taking the probiotic daily. Said she is still eating a lot of fiber. She eats a lot of salads and a lot of chicken. Please advise!

## 2014-05-23 NOTE — Telephone Encounter (Signed)
PATIENT CALLED AND STATED THAT DOUBLING THE DEXILANT IS NOT HELPING.  STILL HAVING EPIGASTRIC DISCOMFORT, NOT AS BAD AS WHEN SHE HAD AN ULCER BUT UNCOMFORTABLE.   STILL GETTING HEARTBURN.  [phone]

## 2014-05-24 NOTE — Telephone Encounter (Signed)
Pt is aware. OK to schedule, but some days she is not available, please call her first.

## 2014-05-24 NOTE — Telephone Encounter (Signed)
LMOM to call.

## 2014-05-28 NOTE — Telephone Encounter (Signed)
Tried to call pt. Number busy x 2 attempts

## 2014-05-28 NOTE — Telephone Encounter (Signed)
PT is ready to schedule but needs to discuss dates.

## 2014-05-29 ENCOUNTER — Telehealth: Payer: Self-pay

## 2014-05-29 ENCOUNTER — Other Ambulatory Visit: Payer: Self-pay

## 2014-05-29 DIAGNOSIS — R1013 Epigastric pain: Secondary | ICD-10-CM

## 2014-05-29 NOTE — Telephone Encounter (Signed)
Spoke with pt and she is available for EGD on 06/12/2014. See phone note for 05/29/2014

## 2014-05-29 NOTE — Telephone Encounter (Signed)
Spoke with pt to schedule her EGD.  Pt states she is available on 06/12/2014.  Pt is set for 06/12/2014 @ 1:15pm.  Reviewed medications and history. Pt denies any changes.

## 2014-05-30 NOTE — Telephone Encounter (Signed)
REVIEWED-NO ADDITIONAL RECOMMENDATIONS. 

## 2014-05-30 NOTE — Progress Notes (Signed)
REVIEWED-NO ADDITIONAL RECOMMENDATIONS. 

## 2014-05-31 ENCOUNTER — Institutional Professional Consult (permissible substitution): Payer: Medicare Other | Admitting: Pulmonary Disease

## 2014-06-01 NOTE — Telephone Encounter (Signed)
Noted  

## 2014-06-07 ENCOUNTER — Other Ambulatory Visit (INDEPENDENT_AMBULATORY_CARE_PROVIDER_SITE_OTHER): Payer: Medicare Other

## 2014-06-07 ENCOUNTER — Encounter: Payer: Self-pay | Admitting: Pulmonary Disease

## 2014-06-07 ENCOUNTER — Ambulatory Visit (INDEPENDENT_AMBULATORY_CARE_PROVIDER_SITE_OTHER): Payer: Medicare Other | Admitting: Pulmonary Disease

## 2014-06-07 VITALS — BP 124/80 | HR 84 | Temp 98.1°F | Ht 69.0 in | Wt 201.0 lb

## 2014-06-07 DIAGNOSIS — Z8701 Personal history of pneumonia (recurrent): Secondary | ICD-10-CM

## 2014-06-07 DIAGNOSIS — R053 Chronic cough: Secondary | ICD-10-CM

## 2014-06-07 DIAGNOSIS — K2100 Gastro-esophageal reflux disease with esophagitis, without bleeding: Secondary | ICD-10-CM

## 2014-06-07 DIAGNOSIS — R05 Cough: Secondary | ICD-10-CM

## 2014-06-07 DIAGNOSIS — J449 Chronic obstructive pulmonary disease, unspecified: Secondary | ICD-10-CM | POA: Diagnosis not present

## 2014-06-07 DIAGNOSIS — R911 Solitary pulmonary nodule: Secondary | ICD-10-CM

## 2014-06-07 LAB — CBC WITH DIFFERENTIAL/PLATELET
Basophils Absolute: 0 10*3/uL (ref 0.0–0.1)
Basophils Relative: 0.5 % (ref 0.0–3.0)
EOS ABS: 0.2 10*3/uL (ref 0.0–0.7)
EOS PCT: 2 % (ref 0.0–5.0)
HEMATOCRIT: 42.3 % (ref 36.0–46.0)
Hemoglobin: 14.2 g/dL (ref 12.0–15.0)
LYMPHS PCT: 36.5 % (ref 12.0–46.0)
Lymphs Abs: 3.3 10*3/uL (ref 0.7–4.0)
MCHC: 33.5 g/dL (ref 30.0–36.0)
MCV: 85.5 fl (ref 78.0–100.0)
MONOS PCT: 8.5 % (ref 3.0–12.0)
Monocytes Absolute: 0.8 10*3/uL (ref 0.1–1.0)
Neutro Abs: 4.7 10*3/uL (ref 1.4–7.7)
Neutrophils Relative %: 52.5 % (ref 43.0–77.0)
Platelets: 279 10*3/uL (ref 150.0–400.0)
RBC: 4.95 Mil/uL (ref 3.87–5.11)
RDW: 14.7 % (ref 11.5–15.5)
WBC: 8.9 10*3/uL (ref 4.0–10.5)

## 2014-06-07 LAB — BASIC METABOLIC PANEL
BUN: 12 mg/dL (ref 6–23)
CALCIUM: 9.9 mg/dL (ref 8.4–10.5)
CO2: 29 mEq/L (ref 19–32)
CREATININE: 0.83 mg/dL (ref 0.40–1.20)
Chloride: 101 mEq/L (ref 96–112)
GFR: 72.4 mL/min (ref >60.00)
Glucose, Bld: 98 mg/dL (ref 70–99)
POTASSIUM: 4.5 meq/L (ref 3.5–5.1)
SODIUM: 136 meq/L (ref 135–145)

## 2014-06-07 MED ORDER — PREDNISONE 10 MG PO TABS: ORAL_TABLET | ORAL | Status: DC

## 2014-06-07 NOTE — Progress Notes (Addendum)
Subjective:     Patient ID: Ellen Bowers, female   DOB: [birth year], 69 y.o.   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, retail and an ex-smoker- having started smoking in her teens and smoked for ~74yr up to 2ppd transiently (1ppd was ave), and quit in 2011 w/ the help of [employer];  She denies resp symptoms while she was smoking- no cough/ sput/ hemoptysis or SOB; she notes that she'd get bronchitis once or twice a yr, and she was told about "asthma" many yrs ago;  When pressed she notes current symptoms had their onset ~157yrgo w/ progressive cough & says it's been worse ever [facility] (treated on return w/ shot, Antibiotic & Prednisone; she is really in denial about her COPD- wondering if her symptoms are due to allergies (but present all yr), and wonders if it's reflux related (but not worse qhs), and says she wants to be sure it's not something else... Current symptoms include> SOB/DOE w/ mild activities like stairs, walking on incline, or if rushed but denies prob w/ ADLs etc;  She notes cough, now mostly dry, occas clear sput, no hempotysis;  She has no known occupational exposures and NEG FamHx for lung dis...   EXAM reveals Afeb, VSS, O2sat=94% on RA;  HEENT- neg, mallampati2;  Chest- decrBS bilat, few scat rhonchi at bases, no w/r/ consolidation;  Heart- RR w/o m/r/g;  Abd- soft/ neg;  Ext- w/o c/c/e;  Neuro- intact w/o focal abn...  [place]  [place], great vessels, & coronaries, mild diffuse bronchial wall thickening & centrilobular emphysema, several scattered nonspecific subcentimeter pulm nodules (largest=52m51mn medial RLL), no adenopathy; (Note> f/u CT was rec in 6-74m99morecheck these nodules)  [facility] Chest [facility]/ [place] [facility] [place], centrilobular emphysema in upper lobes bilat, stable 52mm 39m nodule, no pathologically enlarged LNs...   [place] [facility] [place] [place] heart size, COPD & patchy interstitial prominence on right esp RUL concerning for multilobar pneumonia  [place] [facility] [place] FVC=3.30 (96%), FEV1=2.10 (78%), %1sec=63, mid-flows reduced at 31% predicted; Lung Volumes showed TLC=5.81 (100%), RV=2.51 (108%), RV/TLC=43%; DLCO=63% predicted but corrected to 109% with alveolar ventilation correction... This is c/w moderate airflow obstruction and GOLD Stage 2 COPD...   [facility]104 [place] [facility] [place] [place]- wnl (4%eos-300absolute);  Chems- wnl;  TSH=4.58  She is due for f/u CXR & CT Chest 6/16> she wants to get [facility] at [facility]> ordered & pending ==>              CXR 6/16 showed norm heart size, underlying COPD/hyperinflation, scarring in apicies, otherw clear/ wnl/ NAD...   Marland KitchenMarland Kitchen        CT Chest 6/16 showed norm heart size, arteriosclerotic changes in Ao, branch vessels, & coronaries; lungs show emphysematous changes & prev 52mm62m  nodule in RLL is smaller & in an area of scarring, no adenopathy...   Additional LABS- IgE, RAST panel, A1AT level> ordered & pending ==> IgE=19, RAST panel all neg x +Candida;                        CXR 06/13/14                                           CT Chest 06/13/14      IMP/PLAN>>  Ellen Bowers is a recent ex-smoker w/ a signif past smoking hx; she has underlying COPD/ Emphysema and a chronic cough that has been hard to shake since the pneumonia in Jan2016;  I have recommended a tapering course of oral  PREDNISONE along w/ her ADVAIR250, DUONEBS Tid, Singulair10, and Hycodan as needed... She has f/u CXR, CT Chest, & some additional labs ordered & pending (see above)...     Past Medical History  Diagnosis Date  . Asthma   . COPD (chronic obstructive pulmonary disease) >> she takes DEXILANT60Bid    . GERD (gastroesophageal reflux disease)   . Arthritis >> she says she cannot do w/o CELEBREX200Bid & Tramadol50 prn   . Von Willebrand disease 1996  . Helicobacter pylori gastritis  2005    Dysthymia >> on Wellbutrin150Bid & EffexorER150    Past Surgical History  Procedure Laterality Date  . Tubal ligation  1984  . Neck surgery  1951    cyst removal  . Tonsillectomy  1951  . Cataract extraction      double  . Colonoscopy N/A 05/30/2012    Dr.Mann:Multiple polyps removed from the right colon-see description above; otherwise normal colonoscopy up to the cecum. 5 polyps, multiple tubular adenomas. next tcs 05/2015  . Neck surgery  1998    growth removed  . Esophagogastroduodenoscopy  2012    Dr. Collene Mares: few erosions in duodenal bulb. BRAVO off PPI ("severe reflux")  . Flexible sigmoidoscopy N/A 02/05/2014    Dr. Oneida Alar: 5, 2-3 mm sessile sigmoid colon polyps removed, small internal hemorrhoids, moderate external hemorrhoids, 2 anal fissures present. Polyps were hyperplastic. Next colonoscopy in 5 years.    Outpatient Encounter Prescriptions as of 06/07/2014  Medication Sig  . albuterol (PROVENTIL HFA;VENTOLIN HFA) 108 (90 BASE) MCG/ACT inhaler Inhale 2 puffs into the lungs every 6 (six) hours as needed for wheezing or shortness of breath.  [name] alum & mag hydroxide-simeth (MAALOX/MYLANTA) 200-200-20 MG/5ML suspension Take 15 mLs by mouth as needed for indigestion or heartburn.  . Ascorbic Acid (VITAMIN C) 1000 MG tablet Take 1,000 mg by mouth daily.  [name] aspirin 81 MG tablet Take 81 mg by mouth daily.  [name] buPROPion (WELLBUTRIN SR) 150 MG 12 hr tablet Take 150 mg by mouth 2 (two) times daily.  .  celecoxib (CELEBREX) 200 MG capsule Take 200 mg by mouth 2 (two) times daily.  . cetirizine (ZYRTEC) 10 MG tablet Take 10 mg by mouth daily.  [name] dexlansoprazole (DEXILANT) 60 MG capsule Take 1 capsule (60 mg total) by mouth 2 (two) times daily.  . Fluticasone-Salmeterol (ADVAIR) 250-50 MCG/DOSE AEPB Inhale 1 puff into the lungs every 12 (twelve) hours.  [name] ipratropium-albuterol (DUONEB) 0.5-2.5 (3) MG/3ML SOLN Take 3 mLs by nebulization every 6 (six) hours as needed (shortness of breath).  . montelukast (SINGULAIR) 10 MG tablet Take 10 mg by mouth daily.   [name]  Nitroglycerin 0.4 % OINT Place 1 drop rectally 3 (three) times daily. FOR 21 DAYS  . pravastatin (PRAVACHOL) 20 MG tablet Take 20 mg by mouth daily.  . solifenacin (VESICARE) 5 MG tablet Take 5 mg by mouth daily.  . sucralfate (CARAFATE) 1 G tablet Take 1 g by mouth 4 (four) times daily as needed.   . traMADol (ULTRAM) 50 MG tablet Take by mouth 2 (two) times daily.  [name] venlafaxine XR (EFFEXOR-XR) 150 MG 24 hr capsule Take 150 mg by mouth at bedtime.   [name] HYDROcodone-homatropine (HYCODAN) 5-1.5 MG/5ML syrup 5 mLs every 6 (six) hours as needed.  . hydrocortisone (PROCTOSOL HC) 2.5 % rectal cream Place 1 application rectally 2 (two) times daily. START FEB 8 (Patient not taking: Reported on 05/30/2014)  . predniSONE (DELTASONE) 10 MG tablet Take as directed   No facility-administered encounter medications on file as of 06/07/2014.    Allergies  Allergen Reactions  . Betadine [Povidone Iodine] Rash    Immunization History  Administered Date(s) Administered  . Influenza-Unspecified 11/06/2013  . Pneumococcal Polysaccharide-23 10/07/2012   Family History  Problem Relation Age of Onset  . Colon cancer Mother     less than age 60  . Colon cancer Father     more than age 36  . Liver disease Neg Hx     History   Social History  . Marital Status: Married    Spouse Name: N/A  . Number of Children: N/A  . Years of Education: N/A    Occupational History  . Not on file.   Social History Main Topics  . Smoking status: Former Smoker -- 1.00 packs/day for 50 years    Types: Cigarettes    Quit date: 05/30/2009  . Smokeless tobacco: Not on file     Comment: Quit smoking x 5 years  . Alcohol Use: Yes     Comment: occasionally    . Drug Use: No  . Sexual Activity: Not on file   Other Topics Concern  . Not on file   Social History Narrative    Current Medications, Allergies, Past Medical History, Past Surgical History, Family History, and Social History were reviewed in [employer] record.   Review of Systems        All symptoms NEG except where BOLDED >>  Constitutional:  F/C/S, fatigue, anorexia, unexpected weight change. HEENT:  HA, visual changes, hearing loss, earache, nasal symptoms, sore throat, mouth sores, hoarseness. Resp:  cough, sputum, hemoptysis; SOB, tightness, wheezing. Cardio:  CP, palpit, DOE, orthopnea, edema. GI:  N/V/D/C, blood in stool; reflux, abd pain, distention, gas. GU:  dysuria, freq, urgency, hematuria, flank pain, voiding difficulty. MS:  joint pain, swelling, tenderness, decr ROM; neck pain, back pain, etc. Neuro:  HA, tremors, seizures, dizziness, syncope, weakness, numbness, gait abn. Skin:  suspicious lesions or skin rash. Heme:  adenopathy, bruising, bleeding. Psyche:  confusion, agitation, sleep disturbance, hallucinations, anxiety, depression suicidal.   Objective:   Physical Exam      Vital Signs:  Reviewed...  General:  WD, overweight, 69 y/o WF in NAD; alert & oriented; pleasant & cooperative... HEENT:  [place]/AT; Conjunctiva- pink, Sclera- nonicteric, EOM-wnl, PERRLA, EACs-clear, TMs-wnl; NOSE-sl red; THROAT-clear & wnl, mallampati2. Neck:  Supple w/ fairROM; no JVD; normal carotid impulses w/o bruits; no thyromegaly or nodules palpated; no lymphadenopathy. Chest:  Sl decr BS at bases, scar rhonchi, w/o w/r/consolidation... Heart:  Regular  Rhythm; gr1/6SEM, w/o rubs or gallops detected. Abdomen:  Soft & nontender-  no guarding or rebound; normal bowel sounds; no organomegaly or masses palpated. Ext:  Sl decr ROM; without deformities mild arthritic changes; no varicose veins, +venous insuffic, tr edema;  Pulses intact w/o bruits. Neuro:  CNs II-XII intact; motor testing normal; sensory testing normal; gait normal & balance OK. Derm:  No lesions noted; no rash etc. Lymph:  No cervical, supraclavicular, axillary, or inguinal adenopathy palpated.   Assessment:     IMP >>  Persistent cough after episode of pneumonia Jan2016 -- she has f/u CXR & CT Chest pending COPD/ emphysema/ ex-smoker Several subcentimeter pulm nodules seen on CT Chest -- f/u scan ordered & pending  PLAN >>  Ellen Bowers is a recent ex-smoker w/ a signif past smoking hx; she has underlying COPD/ Emphysema and a chronic cough that has been hard to shake since the pneumonia in Jan2016;  I have recommended a tapering course of oral PREDNISONE along w/ her ADVAIR250, DUONEBS Tid, Singulair10, and Hycodan as needed... She has f/u CXR, CT Chest, & some additional labs ordered & pending...       Plan:     Patient's Medications  New Prescriptions   PREDNISONE (DELTASONE) 10 MG TABLET    Take as directed  Previous Medications   ALBUTEROL (PROVENTIL HFA;VENTOLIN HFA) 108 (90 BASE) MCG/ACT INHALER    Inhale 2 puffs into the lungs every 6 (six) hours as needed for wheezing or shortness of breath.   ALUM & MAG HYDROXIDE-SIMETH (MAALOX/MYLANTA) 200-200-20 MG/5ML SUSPENSION    Take 15 mLs by mouth as needed for indigestion or heartburn.   ASCORBIC ACID (VITAMIN C) 1000 MG TABLET    Take 1,000 mg by mouth daily.   ASPIRIN 81 MG TABLET    Take 81 mg by mouth daily.   BUPROPION (WELLBUTRIN SR) 150 MG 12 HR TABLET    Take 150 mg by mouth 2 (two) times daily.   CELECOXIB (CELEBREX) 200 MG CAPSULE    Take 200 mg by mouth 2 (two) times daily.   CETIRIZINE (ZYRTEC) 10 MG TABLET     Take 10 mg by mouth daily.   DEXLANSOPRAZOLE (DEXILANT) 60 MG CAPSULE    Take 1 capsule (60 mg total) by mouth 2 (two) times daily.   FLUTICASONE-SALMETEROL (ADVAIR) 250-50 MCG/DOSE AEPB    Inhale 1 puff into the lungs every 12 (twelve) hours.   HYDROCODONE-HOMATROPINE (HYCODAN) 5-1.5 MG/5ML SYRUP    5 mLs every 6 (six) hours as needed.   HYDROCORTISONE (PROCTOSOL HC) 2.5 % RECTAL CREAM    Place 1 application rectally 2 (two) times daily. START FEB 8   IPRATROPIUM-ALBUTEROL (DUONEB) 0.5-2.5 (3) MG/3ML SOLN    Take 3 mLs by nebulization every 6 (six) hours as needed (shortness of breath).   MONTELUKAST (SINGULAIR) 10 MG TABLET    Take 10 mg by mouth daily.    NITROGLYCERIN 0.4 % OINT    Place 1 drop rectally 3 (three) times daily. FOR 21 DAYS   PRAVASTATIN (PRAVACHOL) 20 MG TABLET    Take 20 mg by mouth daily.   SOLIFENACIN (VESICARE) 5 MG TABLET    Take 5 mg by mouth daily.   SUCRALFATE (CARAFATE) 1 G TABLET    Take 1 g by mouth 4 (four) times daily as needed.    TRAMADOL (ULTRAM) 50 MG TABLET    Take by mouth 2 (two) times daily.   VENLAFAXINE XR (EFFEXOR-XR) 150 MG 24 HR CAPSULE    Take 150 mg by mouth at bedtime.   Modified Medications  No medications on file  Discontinued Medications   No medications on file

## 2014-06-07 NOTE — Patient Instructions (Addendum)
Kaziyah- it was great meeting you today...  We discussed further evaluation with a follow up CXR, CT Chest, and some blood work...    We will contact you w/ the results when available...   We decided to treat your cough & shortness of breath w/ a slow tapering course of PREDNISONE '10mg'$  tabs>    Start w/ one tab twice daily for 1 week,     Then decrease to 1 tab each AM for 1 week,     Then decrease to 1/2 tab each AM for 1 week,     Then decrease top 1/2 tab every other day (1/2, 0, 1/2, 0, etc) til return visit...  Continue your other meds the same>    Advair250- one inhalation twice daily...    Use the NEBULIZER 3-4 times daily...    OK to use the Hycodan as needed for the cough    OK to use the Singulair each evening...  Call for any questions...  Let's plan a follow up visit in 4-6 weeks, sooner if needed for problems.Marland KitchenMarland Kitchen

## 2014-06-08 LAB — ALLERGY FULL PROFILE
Allergen,Goose feathers, e70: <0.1 kU/L
Alternaria Alternata: <0.1 kU/L
Aspergillus fumigatus, m3: <0.1 kU/L
Bahia Grass: <0.1 kU/L
Bermuda Grass: <0.1 kU/L
Candida Albicans: 0.71 kU/L — ABNORMAL HIGH
Cat Dander: <0.1 kU/L
Common Ragweed: <0.1 kU/L
D. farinae: <0.1 kU/L
Dog Dander: <0.1 kU/L
Fescue: <0.1 kU/L
G005 Rye, Perennial: <0.1 kU/L
Helminthosporium halodes: <0.1 kU/L
House Dust Hollister: <0.1 kU/L
IGE (IMMUNOGLOBULIN E), SERUM: 19 kU/L (ref <115)
Lamb's Quarters: <0.1 kU/L
Oak: <0.1 kU/L
Plantain: <0.1 kU/L
Timothy Grass: <0.1 kU/L

## 2014-06-12 ENCOUNTER — Ambulatory Visit (HOSPITAL_COMMUNITY)
Admission: RE | Admit: 2014-06-12 | Discharge: 2014-06-12 | Disposition: A | Discharge status: Home / Self Care | Payer: Medicare Other | Source: Ambulatory Visit | Attending: Gastroenterology | Admitting: Gastroenterology

## 2014-06-12 ENCOUNTER — Encounter (HOSPITAL_COMMUNITY)
Admission: RE | Disposition: A | Discharge status: Home / Self Care | Payer: Self-pay | Source: Ambulatory Visit | Attending: Gastroenterology

## 2014-06-12 DIAGNOSIS — K219 Gastro-esophageal reflux disease without esophagitis: Secondary | ICD-10-CM | POA: Insufficient documentation

## 2014-06-12 DIAGNOSIS — R1013 Epigastric pain: Secondary | ICD-10-CM | POA: Diagnosis not present

## 2014-06-12 DIAGNOSIS — Z888 Allergy status to other drugs, medicaments and biological substances status: Secondary | ICD-10-CM | POA: Insufficient documentation

## 2014-06-12 DIAGNOSIS — Z79899 Other long term (current) drug therapy: Secondary | ICD-10-CM | POA: Insufficient documentation

## 2014-06-12 DIAGNOSIS — Z87891 Personal history of nicotine dependence: Secondary | ICD-10-CM | POA: Diagnosis not present

## 2014-06-12 DIAGNOSIS — J449 Chronic obstructive pulmonary disease, unspecified: Secondary | ICD-10-CM | POA: Insufficient documentation

## 2014-06-12 DIAGNOSIS — K2950 Unspecified chronic gastritis without bleeding: Secondary | ICD-10-CM | POA: Diagnosis not present

## 2014-06-12 DIAGNOSIS — J45909 Unspecified asthma, uncomplicated: Secondary | ICD-10-CM | POA: Diagnosis not present

## 2014-06-12 DIAGNOSIS — Z9851 Tubal ligation status: Secondary | ICD-10-CM | POA: Insufficient documentation

## 2014-06-12 DIAGNOSIS — D680 Von Willebrand's disease: Secondary | ICD-10-CM | POA: Insufficient documentation

## 2014-06-12 DIAGNOSIS — M1990 Unspecified osteoarthritis, unspecified site: Secondary | ICD-10-CM | POA: Diagnosis not present

## 2014-06-12 HISTORY — PX: ESOPHAGOGASTRODUODENOSCOPY: SHX5428

## 2014-06-12 SURGERY — EGD (ESOPHAGOGASTRODUODENOSCOPY)
Anesthesia: Moderate Sedation

## 2014-06-12 MED ORDER — MIDAZOLAM HCL 5 MG/5ML IJ SOLN
INTRAMUSCULAR | Status: AC
  Filled 2014-06-12: qty 10

## 2014-06-12 MED ORDER — STERILE WATER FOR IRRIGATION IR SOLN
Status: DC | PRN
  Administered 2014-06-12: 14:00:00

## 2014-06-12 MED ORDER — LIDOCAINE VISCOUS 2 % MT SOLN
OROMUCOSAL | Status: AC
  Filled 2014-06-12: qty 15

## 2014-06-12 MED ORDER — PROMETHAZINE HCL 25 MG/ML IJ SOLN
INTRAMUSCULAR | Status: AC
  Filled 2014-06-12: qty 1

## 2014-06-12 MED ORDER — MIDAZOLAM HCL 5 MG/5ML IJ SOLN
INTRAMUSCULAR | Status: DC | PRN
  Administered 2014-06-12 (×3): 2 mg via INTRAVENOUS
  Administered 2014-06-12: 1 mg via INTRAVENOUS

## 2014-06-12 MED ORDER — MEPERIDINE HCL 100 MG/ML IJ SOLN
INTRAMUSCULAR | Status: DC | PRN
  Administered 2014-06-12 (×3): 25 mg via INTRAVENOUS

## 2014-06-12 MED ORDER — SODIUM CHLORIDE 0.9 % IV SOLN
INTRAVENOUS | Status: DC
  Administered 2014-06-12: 13:00:00 via INTRAVENOUS

## 2014-06-12 MED ORDER — LIDOCAINE VISCOUS 2 % MT SOLN
OROMUCOSAL | Status: DC | PRN
  Administered 2014-06-12: 4 mL via OROMUCOSAL

## 2014-06-12 MED ORDER — FENTANYL CITRATE (PF) 100 MCG/2ML IJ SOLN
INTRAMUSCULAR | Status: AC
  Filled 2014-06-12: qty 4

## 2014-06-12 NOTE — Discharge Instructions (Signed)
You have mild gastritis. I biopsied your stomach.  CONTINUE DEXILANT  AVOID TRIGGERS FOR GASTRITIS. SEE INFO BELOW.  FOLLOW A LOW FAT DIET. SEE INFO BELOW.  FOLLOW UP IN 4 MOS.  UPPER ENDOSCOPY AFTER CARE Read the instructions outlined below and refer to this sheet in the next week. These discharge instructions provide you with general information on caring for yourself after you leave the hospital. While your treatment has been planned according to the most current medical practices available, unavoidable complications occasionally occur. If you have any problems or questions after discharge, call DR. Hermina Barnard, [phone].  ACTIVITY  You may resume your regular activity, but move at a slower pace for the next 24 hours.   Take frequent rest periods for the next 24 hours.   Walking will help get rid of the air and reduce the bloated feeling in your belly (abdomen).   No driving for 24 hours (because of the medicine (anesthesia) used during the test).   You may shower.   Do not sign any important legal documents or operate any machinery for 24 hours (because of the anesthesia used during the test).    NUTRITION  Drink plenty of fluids.   You may resume your normal diet as instructed by your doctor.   Begin with a light meal and progress to your normal diet. Heavy or fried foods are harder to digest and may make you feel sick to your stomach (nauseated).   Avoid alcoholic beverages for 24 hours or as instructed.    MEDICATIONS  You may resume your normal medications.   WHAT YOU CAN EXPECT TODAY  Some feelings of bloating in the abdomen.   Passage of more gas than usual.    IF YOU HAD A BIOPSY TAKEN DURING THE UPPER ENDOSCOPY:  Eat a soft diet IF YOU HAVE NAUSEA, BLOATING, ABDOMINAL PAIN, OR VOMITING.    FINDING OUT THE RESULTS OF YOUR TEST Not all test results are available during your visit. DR. Oneida Alar WILL CALL YOU WITHIN 14 DAYS OF YOUR PROCEDUE WITH YOUR  RESULTS. Do not assume everything is normal if you have not heard from DR. Quashon Jesus, CALL HER OFFICE AT [phone].  SEEK IMMEDIATE MEDICAL ATTENTION AND CALL THE OFFICE: [phone] IF:  You have more than a spotting of blood in your stool.   Your belly is swollen (abdominal distention).   You are nauseated or vomiting.   You have a temperature over 101F.   You have abdominal pain or discomfort that is severe or gets worse throughout the day.   Gastritis  Gastritis is an inflammation (the body's way of reacting to injury and/or infection) of the stomach. It is often caused by viral or bacterial (germ) infections. It can also be caused BY ASPIRIN, BC/GOODY POWDER'S, (IBUPROFEN) MOTRIN, OR ALEVE (NAPROXEN), chemicals (including alcohol), SPICY FOODS, and medications. This illness may be associated with generalized malaise (feeling tired, not well), UPPER ABDOMINAL STOMACH cramps, and fever. One common bacterial cause of gastritis is an organism known as H. Pylori. This can be treated with antibiotics.    Low-Fat Diet  BREADS, CEREALS, PASTA, RICE, DRIED PEAS, AND BEANS These products are high in carbohydrates and most are low in fat. Therefore, they can be increased in the diet as substitutes for fatty foods. They too, however, contain calories and should not be eaten in excess. Cereals can be eaten for snacks as well as for breakfast.  Include foods that contain fiber (fruits, vegetables, whole grains, and legumes). Research  shows that fiber may lower blood cholesterol levels, especially the water-soluble fiber found in fruits, vegetables, oat products, and legumes.  FRUITS AND VEGETABLES It is good to eat fruits and vegetables. Besides being sources of fiber, both are rich in vitamins and some minerals. They help you get the daily allowances of these nutrients. Fruits and vegetables can be used for snacks and desserts.  MEATS Limit lean meat, chicken, [place], and fish to no more than  6 ounces per day.  Beef, Pork, and Lamb Use lean cuts of beef, pork, and lamb. Lean cuts include:  Extra-lean ground beef.  Arm roast.  Sirloin tip.  Center-cut ham.  Round steak.  Loin chops.  Rump roast.  Tenderloin.  Trim all fat off the outside of meats before cooking. It is not necessary to severely decrease the intake of red meat, but lean choices should be made. Lean meat is rich in protein and contains a highly absorbable form of iron. Premenopausal women, in particular, should avoid reducing lean red meat because this could increase the risk for low red blood cells (iron-deficiency anemia).  Chicken and [place] These are good sources of protein. The fat of poultry can be reduced by removing the skin and underlying fat layers before cooking. Chicken and [place] can be substituted for lean red meat in the diet. Poultry should not be fried or covered with high-fat sauces.  Fish and Shellfish Fish is a good source of protein. Shellfish contain cholesterol, but they usually are low in saturated fatty acids. The preparation of fish is important. Like chicken and [place], they should not be fried or covered with high-fat sauces.  EGGS Egg whites contain no fat or cholesterol. They can be eaten often. Try 1 to 2 egg whites instead of whole eggs in recipes or use egg substitutes that do not contain yolk.  MILK AND DAIRY PRODUCTS Use skim or 1% milk instead of 2% or whole milk. Decrease whole milk, natural, and processed cheeses. Use nonfat or low-fat (2%) cottage cheese or low-fat cheeses made from vegetable oils. Choose nonfat or low-fat (1 to 2%) yogurt. Experiment with evaporated skim milk in recipes that call for heavy cream. Substitute low-fat yogurt or low-fat cottage cheese for sour cream in dips and salad dressings. Have at least 2 servings of low-fat dairy products, such as 2 glasses of skim (or 1%) milk each day to help get your daily calcium intake.  FATS AND OILS Reduce the total  intake of fats, especially saturated fat. Butterfat, lard, and beef fats are high in saturated fat and cholesterol. These should be avoided as much as possible. Vegetable fats do not contain cholesterol, but certain vegetable fats, such as coconut oil, palm oil, and palm kernel oil are very high in saturated fats. These should be limited. These fats are often used in bakery goods, processed foods, popcorn, oils, and nondairy creamers. Vegetable shortenings and some peanut butters contain hydrogenated oils, which are also saturated fats. Read the labels on these foods and check for saturated vegetable oils.  Unsaturated vegetable oils and fats do not raise blood cholesterol. However, they should be limited because they are fats and are high in calories. Total fat should still be limited to 30% of your daily caloric intake. Desirable liquid vegetable oils are corn oil, cottonseed oil, olive oil, canola oil, safflower oil, soybean oil, and sunflower oil. Peanut oil is not as good, but small amounts are acceptable. Buy a heart-healthy tub margarine that has no partially hydrogenated  oils in the ingredients. Mayonnaise and salad dressings often are made from unsaturated fats, but they should also be limited because of their high calorie and fat content. Seeds, nuts, peanut butter, olives, and avocados are high in fat, but the fat is mainly the unsaturated type. These foods should be limited mainly to avoid excess calories and fat.  OTHER EATING TIPS Snacks  Most sweets should be limited as snacks. They tend to be rich in calories and fats, and their caloric content outweighs their nutritional value. Some good choices in snacks are graham crackers, melba toast, soda crackers, bagels (no egg), English muffins, fruits, and vegetables. These snacks are preferable to snack crackers, [place] fries, and chips. Popcorn should be air-popped or cooked in small amounts of liquid vegetable oil.  Desserts Eat fruit, low-fat  yogurt, and fruit ices instead of pastries, cake, and cookies. Sherbet, angel food cake, gelatin dessert, frozen low-fat yogurt, or other frozen products that do not contain saturated fat (pure fruit juice bars, frozen ice pops) are also acceptable.   COOKING METHODS Choose those methods that use little or no fat. They include: Poaching.  Braising.  Steaming.  Grilling.  Baking.  Stir-frying.  Broiling.  Microwaving.  Foods can be cooked in a nonstick pan without added fat, or use a nonfat cooking spray in regular cookware. Limit fried foods and avoid frying in saturated fat. Add moisture to lean meats by using water, broth, cooking wines, and other nonfat or low-fat sauces along with the cooking methods mentioned above. Soups and stews should be chilled after cooking. The fat that forms on top after a few hours in the refrigerator should be skimmed off. When preparing meals, avoid using excess salt. Salt can contribute to raising blood pressure in some people.  EATING AWAY FROM HOME Order entres, potatoes, and vegetables without sauces or butter. When meat exceeds the size of a deck of cards (3 to 4 ounces), the rest can be taken home for another meal. Choose vegetable or fruit salads and ask for low-calorie salad dressings to be served on the side. Use dressings sparingly. Limit high-fat toppings, such as bacon, crumbled eggs, cheese, sunflower seeds, and olives. Ask for heart-healthy tub margarine instead of butter.

## 2014-06-12 NOTE — H&P (Signed)
Primary Care Physician:  [name], MD Primary Gastroenterologist:  Dr. Oneida Alar  Pre-Procedure History & Physical: HPI:  Ellen Bowers is a 69 y.o. female here for ABDOMINAL PAIN/DYSPEPSIA.  Past Medical History  Diagnosis Date  . Asthma   . COPD (chronic obstructive pulmonary disease)   . GERD (gastroesophageal reflux disease)   . Arthritis   . Von Willebrand disease 1996  . Helicobacter pylori gastritis 2005    treated    Past Surgical History  Procedure Laterality Date  . Tubal ligation  1984  . Neck surgery  1951    cyst removal  . Tonsillectomy  1951  . Cataract extraction      double  . Colonoscopy N/A 05/30/2012    Dr.Mann:Multiple polyps removed from the right colon-see description above; otherwise normal colonoscopy up to the cecum. 5 polyps, multiple tubular adenomas. next tcs 05/2015  . Neck surgery  1998    growth removed  . Esophagogastroduodenoscopy  2012    Dr. Collene Mares: few erosions in duodenal bulb. BRAVO off PPI ("severe reflux")  . Flexible sigmoidoscopy N/A 02/05/2014    Dr. Oneida Alar: 5, 2-3 mm sessile sigmoid colon polyps removed, small internal hemorrhoids, moderate external hemorrhoids, 2 anal fissures present. Polyps were hyperplastic. Next colonoscopy in 5 years.    Prior to Admission medications   Medication Sig Start Date End Date Taking? Authorizing Provider  albuterol (PROVENTIL HFA;VENTOLIN HFA) 108 (90 BASE) MCG/ACT inhaler Inhale 2 puffs into the lungs every 6 (six) hours as needed for wheezing or shortness of breath.   Yes Historical Provider, MD  alum & mag hydroxide-simeth (MAALOX/MYLANTA) 200-200-20 MG/5ML suspension Take 15 mLs by mouth as needed for indigestion or heartburn.   Yes Historical Provider, MD  Ascorbic Acid (VITAMIN C) 1000 MG tablet Take 1,000 mg by mouth daily.   Yes Historical Provider, MD  aspirin 81 MG tablet Take 81 mg by mouth daily.   Yes Historical Provider, MD  buPROPion (WELLBUTRIN SR) 150 MG 12 hr tablet Take 150  mg by mouth 2 (two) times daily.   Yes Historical Provider, MD  celecoxib (CELEBREX) 200 MG capsule Take 200 mg by mouth 2 (two) times daily.   Yes Historical Provider, MD  cetirizine (ZYRTEC) 10 MG tablet Take 10 mg by mouth daily.   Yes Historical Provider, MD  dexlansoprazole (DEXILANT) 60 MG capsule Take 1 capsule (60 mg total) by mouth 2 (two) times daily. 12/28/13  Yes Danie Binder, MD  Fluticasone-Salmeterol (ADVAIR) 250-50 MCG/DOSE AEPB Inhale 1 puff into the lungs every 12 (twelve) hours.   Yes Historical Provider, MD  HYDROcodone-homatropine (HYCODAN) 5-1.5 MG/5ML syrup 5 mLs every 6 (six) hours as needed. 05/08/14  Yes Historical Provider, MD  ipratropium-albuterol (DUONEB) 0.5-2.5 (3) MG/3ML SOLN Take 3 mLs by nebulization every 6 (six) hours as needed (shortness of breath).   Yes Historical Provider, MD  montelukast (SINGULAIR) 10 MG tablet Take 10 mg by mouth daily.    Yes Historical Provider, MD  Nitroglycerin 0.4 % OINT Place 1 drop rectally 3 (three) times daily. FOR 21 DAYS 02/05/14  Yes Danie Binder, MD  pravastatin (PRAVACHOL) 20 MG tablet Take 20 mg by mouth daily.   Yes Historical Provider, MD  predniSONE (DELTASONE) 10 MG tablet Take as directed 06/07/14  Yes Noralee Space, MD  solifenacin (VESICARE) 5 MG tablet Take 5 mg by mouth daily.   Yes Historical Provider, MD  sucralfate (CARAFATE) 1 G tablet Take 1 g by mouth 4 (four) times daily  as needed.    Yes Historical Provider, MD  traMADol (ULTRAM) 50 MG tablet Take by mouth 2 (two) times daily.   Yes Historical Provider, MD  venlafaxine XR (EFFEXOR-XR) 150 MG 24 hr capsule Take 150 mg by mouth at bedtime.    Yes Historical Provider, MD  hydrocortisone (PROCTOSOL HC) 2.5 % rectal cream Place 1 application rectally 2 (two) times daily. START FEB 8 Patient not taking: Reported on 05/30/2014 02/05/14   Danie Binder, MD    Allergies as of 05/29/2014 - Review Complete 05/29/2014  Allergen Reaction Noted  . Betadine [povidone  iodine] Rash 05/30/2012    Family History  Problem Relation Age of Onset  . Colon cancer Mother     less than age 81  . Colon cancer Father     more than age 48  . Liver disease Neg Hx     History   Social History  . Marital Status: Married    Spouse Name: N/A  . Number of Children: N/A  . Years of Education: N/A   Occupational History  . Not on file.   Social History Main Topics  . Smoking status: Former Smoker -- 1.00 packs/day for 50 years    Types: Cigarettes    Quit date: 05/30/2009  . Smokeless tobacco: Not on file     Comment: Quit smoking x 5 years  . Alcohol Use: Yes     Comment: occasionally    . Drug Use: No  . Sexual Activity: Not on file   Other Topics Concern  . Not on file   Social History Narrative    Review of Systems: See HPI, otherwise negative ROS   Physical Exam: BP 139/85 mmHg  Pulse 78  Temp(Src) 98 F (36.7 C) (Oral)  Resp 18  SpO2 94% General:   Alert,  pleasant and cooperative in NAD Head:  Normocephalic and atraumatic. Neck:  Supple; Lungs:  Clear throughout to auscultation.    Heart:  Regular rate and rhythm. Abdomen:  Soft, nontender and nondistended. Normal bowel sounds, without guarding, and without rebound.   Neurologic:  Alert and  oriented x4;  grossly normal neurologically.  Impression/Plan:    ABDOMINAL PAIN/DYSPEPSIA.  PLAN: 1. EGD TODAY

## 2014-06-13 ENCOUNTER — Ambulatory Visit (HOSPITAL_COMMUNITY)
Admission: RE | Admit: 2014-06-13 | Discharge: 2014-06-13 | Disposition: A | Discharge status: Home / Self Care | Payer: Medicare Other | Source: Ambulatory Visit | Attending: Pulmonary Disease | Admitting: Pulmonary Disease

## 2014-06-13 DIAGNOSIS — Z8701 Personal history of pneumonia (recurrent): Secondary | ICD-10-CM

## 2014-06-13 DIAGNOSIS — K2100 Gastro-esophageal reflux disease with esophagitis, without bleeding: Secondary | ICD-10-CM

## 2014-06-13 DIAGNOSIS — R05 Cough: Secondary | ICD-10-CM

## 2014-06-13 DIAGNOSIS — J449 Chronic obstructive pulmonary disease, unspecified: Secondary | ICD-10-CM

## 2014-06-13 DIAGNOSIS — R053 Chronic cough: Secondary | ICD-10-CM

## 2014-06-13 MED ORDER — IOHEXOL 300 MG/ML  SOLN: 80.0000 mL | Freq: Once | INTRAMUSCULAR | Status: AC | PRN

## 2014-06-13 MED ORDER — IOHEXOL 300 MG/ML  SOLN
100.0000 mL | Freq: Once | INTRAMUSCULAR | Status: AC | PRN
  Administered 2014-06-13: 80 mL via INTRAVENOUS

## 2014-06-13 NOTE — Op Note (Addendum)
[facility] [address] [place], [address]   ENDOSCOPY PROCEDURE REPORT  PATIENT: Ellen Bowers, Ellen Bowers  MR#: [id number] BIRTHDATE: April 03, 1945 , 69  yrs. old GENDER: female  ENDOSCOPIST: Danie Binder, MD REFERRED KG:YJEHUD Manuella Ghazi, M.D.  PROCEDURE DATE: 06/12/2014 PROCEDURE:   EGD w/ biopsy for H.pylori  INDICATIONS:dyspepsia. MEDICATIONS: Fentanyl 75 mcg IV and Versed 7 mg IV TOPICAL ANESTHETIC:   Viscous Xylocaine ASA CLASS:  DESCRIPTION OF PROCEDURE:     Physical exam was performed.  Informed consent was obtained from the patient after explaining the benefits, risks, and alternatives to the procedure.  The patient was connected to the monitor and placed in the left lateral position.  Continuous oxygen was provided by nasal cannula and IV medicine administered through an indwelling cannula.  After administration of sedation, the patients esophagus was intubated and the [id number] ([id number])  endoscope was advanced under direct visualization to the second portion of the duodenum.  The scope was removed slowly by carefully examining the color, texture, anatomy, and integrity of the mucosa on the way out.  The patient was recovered in endoscopy and discharged home in satisfactory condition.   ESOPHAGUS: The mucosa of the esophagus appeared normal.   STOMACH: Mild non-erosive gastritis (inflammation) was found in the gastric antrum.  Multiple biopsies were performed.   DUODENUM: The duodenal mucosa showed no abnormalities in the bulb and 2nd part of the duodenum. COMPLICATIONS: There were no immediate complications.  ENDOSCOPIC IMPRESSION: 1.   The mucosa of the esophagus appeared normal 2.   mild Non-erosive gastritis  RECOMMENDATIONS: CONTINUE DEXILANT AVOID TRIGGERS FOR GASTRITIS. FOLLOW A LOW FAT DIET. FOLLOW UP IN 4 MOS.  REPEAT EXAM:   _______________________________ eSignedDanie Binder, MD 16-Jun-2014 2:16 PM     CPT CODES: ICD  CODES:  The ICD and CPT codes recommended by this software are interpretations from the data that the clinical staff has captured with the software.  The verification of the translation of this report to the ICD and CPT codes and modifiers is the sole responsibility of the health care institution and practicing physician where this report was generated.  [employer] will not be held responsible for the validity of the ICD and CPT codes included on this report.  AMA assumes no liability for data contained or not contained herein. CPT is a Designer, television/film set of the [employer].

## 2014-06-14 ENCOUNTER — Encounter (HOSPITAL_COMMUNITY): Payer: Self-pay | Admitting: Gastroenterology

## 2014-06-15 LAB — ALPHA-1 ANTITRYPSIN PHENOTYPE: A1 ANTITRYPSIN: 141 mg/dL (ref 83–199)

## 2014-06-26 ENCOUNTER — Telehealth: Payer: Self-pay | Admitting: Gastroenterology

## 2014-06-26 NOTE — Telephone Encounter (Signed)
Pt has an appt 10/4 at 11am with LL

## 2014-06-26 NOTE — Telephone Encounter (Signed)
Please call pt. HER stomach Bx shows mild gastritis.    CONTINUE DEXILANT  AVOID TRIGGERS FOR GASTRITIS.   FOLLOW A LOW FAT DIET.   FOLLOW UP IN 4 MOS E30 GASTRITIS/GERD/EPIGASTRIC PAIN.

## 2014-06-26 NOTE — Telephone Encounter (Signed)
Pt is aware.  

## 2014-07-10 ENCOUNTER — Encounter: Payer: Self-pay | Admitting: Pulmonary Disease

## 2014-07-10 ENCOUNTER — Ambulatory Visit (INDEPENDENT_AMBULATORY_CARE_PROVIDER_SITE_OTHER): Payer: Medicare Other | Admitting: Pulmonary Disease

## 2014-07-10 VITALS — BP 126/76 | HR 87 | Temp 98.2°F | Wt 200.0 lb

## 2014-07-10 DIAGNOSIS — R05 Cough: Secondary | ICD-10-CM

## 2014-07-10 DIAGNOSIS — K219 Gastro-esophageal reflux disease without esophagitis: Secondary | ICD-10-CM

## 2014-07-10 DIAGNOSIS — J449 Chronic obstructive pulmonary disease, unspecified: Secondary | ICD-10-CM

## 2014-07-10 DIAGNOSIS — R911 Solitary pulmonary nodule: Secondary | ICD-10-CM | POA: Diagnosis not present

## 2014-07-10 DIAGNOSIS — Z8701 Personal history of pneumonia (recurrent): Secondary | ICD-10-CM

## 2014-07-10 DIAGNOSIS — R053 Chronic cough: Secondary | ICD-10-CM

## 2014-07-10 NOTE — Progress Notes (Signed)
Subjective:     Patient ID: Ellen Bowers, female   DOB: Sep 12, 1945, 69 y.o.   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, retail and an ex-smoker- having started smoking in her teens and smoked for ~1yr up to 2ppd transiently (1ppd was ave), and quit in 2011 w/ the help of [employer];  She denies resp symptoms while she was smoking- no cough/ sput/ hemoptysis or SOB; she notes that she'd get bronchitis once or twice a yr, and she was told about "asthma" many yrs ago;  When pressed she notes current symptoms had their onset ~17yrgo w/ progressive cough & says it's been worse ever [facility] (treated on [facility] w/ shot, Antibiotic & Prednisone; she is really in denial about her COPD- wondering if her symptoms are due to allergies (but present all yr), and wonders if it's reflux related (but not worse qhs), and says she wants to be sure it's not something else... Current symptoms include> SOB/DOE w/ mild activities like stairs, walking on incline, or if rushed but denies prob w/ ADLs etc;  She notes cough, now mostly dry, occas clear sput, no hempotysis;  She has no known occupational exposures and NEG FamHx for lung dis...   EXAM reveals Afeb, VSS, O2sat=94% on RA;  HEENT- neg, mallampati2;  Chest- decrBS bilat, few scat rhonchi at bases, no w/r/ consolidation;  Heart- RR w/o m/r/g;  Abd- soft/ neg;  Ext- w/o c/c/e;  Neuro-  intact w/o focal abn...  [place], great vessels, & coronaries, mild diffuse bronchial wall thickening & centrilobular emphysema, several scattered nonspecific subcentimeter pulm nodules (largest=13m78mn medial RLL), no adenopathy; (Note> f/u CT was rec in 6-56m62morecheck these nodules)  CT Chest [place]/ contrast [place], centrilobular emphysema in upper lobes bilat, stable 13mm 43m nodule, no pathologically enlarged LNs...   [place] heart size, COPD & patchy interstitial prominence on right esp RUL concerning for multilobar pneumonia  P[place]/14 [place] FVC=3.30 (96%), FEV1=2.10 (78%), %1sec=63, mid-flows reduced at 31% predicted; Lung Volumes showed TLC=5.81 (100%), RV=2.51 (108%), RV/TLC=43%; DLCO=63% predicted but corrected to 109% with alveolar ventilation correction... This is c/w moderate airflow obstruction and GOLD Stage 2 COPD...   [facility] [place]104 [place] wnl (4%eos-300absolute);  Chems- wnl;  TSH=4.58  She is due for f/u CXR & CT Chest 6/16> [place] [facility] [place]> ordered & pending ==>              CXR 6/16 showed norm heart size, underlying COPD/hyperinflation, scarring in apicies, otherw clear/ wnl/ NAD...   Marland KitchenMarland Kitchen        CT Chest 6/16 showed norm heart size, arteriosclerotic changes in Ao, branch vessels, &  coronaries; lungs show emphysematous changes & prev 78m nodule in RLL is smaller & in an area of scarring, no adenopathy...   Additional LABS- IgE, RAST panel, A1AT level> ordered & pending ==> IgE=19, RAST panel all neg x +Candida;  A1AT=141 w/ phenotype MM.                      CXR 06/13/14                                           CT Chest 06/13/14     IMP/PLAN>>  RLatonyiais a recent ex-smoker w/ a signif past smoking hx; she has underlying COPD/ Emphysema and a chronic cough that has been hard to shake since the pneumonia in  Jan2016;  I have recommended a tapering course of oral PREDNISONE along w/ her ADVAIR250, DUONEBS Tid, Singulair10, and Hycodan as needed... She has f/u CXR, CT Chest, & some additional labs ordered & pending (see above)...    ~  July 10, 2014:  128moOV w/ SN >> RoSaudiatates that she's doing a little bit better, but she attributes the improvement to OTC Tylenol cold & sinus relief "it works better that [employer]  She continues to work as a hoMerchandiser, retail.     COPD/emphysema, ex-smoker, pulm nodule, recurrent bronchitic infections, hx pneumonia> we treated her w/ a Prednisone taper (down to 1/2 tab Qod til gone), Advair250-Bid, Duoneb Tid, Singulair10, Hycodan prn; f/u scan showed the RLL nodule appears smaller, she feels that OTC Tylenol cold&sinus helped more, using Advair250Bid, using the NEB just once/d Qhs, on Singulair10/d & AlbutHFA prn (ave 2-3x per wk)...    Atherosclerosis seen on CT scans> on ASA81 & has NTG for prn use from her PCP...    Hyperchol> on Prav20 from her PCP    GERD, hx HPylori pos gastritis, hx colon polyps> [place] [facility] Dexilant60, Maalox, Carafate,,,    Hx Von Willebrand's dis> details unknown, no data avail in Epic... We reviewed prob list, meds, xrays and labs>   PLAN>>  [name] is stable w/ her mod airflow obstruction from mixed COPD/chr bronchitis & emphysema; she is way too sedentary & needs to start a formal exercise program- rec the Y, silver sneakers, etc;  Continue current meds but use the NEBS-Bid (eg- lunch & bedtime), Advair250Bid (eg- breakfast & dinner), plus the singulair & her Tylenol cold & sinus;  She will wean off the Pred & f/u in 4-81m17mo   Past Medical History  Diagnosis Date  . Asthma   . COPD (chronic obstructive pulmonary disease) >> she takes DEXILANT60Bid    . GERD (gastroesophageal reflux disease)   . Arthritis >> she says she cannot do w/o CELEBREX200Bid & Tramadol50 prn   . Von Willebrand disease 1996  .  Helicobacter pylori gastritis  2005    Dysthymia >> on Wellbutrin150Bid & EffexorER150    Past Surgical History  Procedure Laterality Date  . Tubal ligation  1984  . Neck surgery  1951    cyst removal  . Tonsillectomy  1951  . Cataract extraction      double  . Colonoscopy N/A 05/30/2012    Dr.Mann:Multiple polyps removed from the right colon-see description above; otherwise normal colonoscopy up to the cecum. 5 polyps, multiple tubular adenomas. next tcs 05/2015  . Neck surgery  1998  growth removed  . Esophagogastroduodenoscopy  2012    Dr. Collene Mares: few erosions in duodenal bulb. BRAVO off PPI ("severe reflux")  . Flexible sigmoidoscopy N/A 02/05/2014    Dr. Oneida Alar: 5, 2-3 mm sessile sigmoid colon polyps removed, small internal hemorrhoids, moderate external hemorrhoids, 2 anal fissures present. Polyps were hyperplastic. Next colonoscopy in 5 years.  . Esophagogastroduodenoscopy N/A 06/12/2014    Procedure: ESOPHAGOGASTRODUODENOSCOPY (EGD);  Surgeon: Danie Binder, MD;  Location: [facility];  Service: Endoscopy;  Laterality: N/A;  1315    Outpatient Encounter Prescriptions as of 07/10/2014  Medication Sig  . dexlansoprazole (DEXILANT) 60 MG capsule Take 1 capsule (60 mg total) by mouth 2 (two) times daily. (Patient taking differently: Take 60 mg by mouth daily. )  . albuterol (PROVENTIL HFA;VENTOLIN HFA) 108 (90 BASE) MCG/ACT inhaler Inhale 2 puffs into the lungs every 6 (six) hours as needed for wheezing or shortness of breath.  [name] alum & mag hydroxide-simeth (MAALOX/MYLANTA) 200-200-20 MG/5ML suspension Take 15 mLs by mouth as needed for indigestion or heartburn.  . Ascorbic Acid (VITAMIN C) 1000 MG tablet Take 1,000 mg by mouth daily.  [name] aspirin 81 MG tablet Take 81 mg by mouth daily.  [name] buPROPion (WELLBUTRIN SR) 150 MG 12 hr tablet Take 150 mg by mouth 2 (two) times daily.  . celecoxib (CELEBREX) 200 MG capsule Take 200 mg by mouth daily.   . cetirizine (ZYRTEC) 10 MG tablet Take  10 mg by mouth daily.  . Fluticasone-Salmeterol (ADVAIR) 250-50 MCG/DOSE AEPB Inhale 1 puff into the lungs every 12 (twelve) hours.  [name] HYDROcodone-homatropine (HYCODAN) 5-1.5 MG/5ML syrup 5 mLs every 6 (six) hours as needed.  [name] ipratropium-albuterol (DUONEB) 0.5-2.5 (3) MG/3ML SOLN Take 3 mLs by nebulization every 6 (six) hours as needed (shortness of breath).  . montelukast (SINGULAIR) 10 MG tablet Take 10 mg by mouth daily.   . Nitroglycerin 0.4 % OINT Place 1 drop rectally 3 (three) times daily. FOR 21 DAYS  . pravastatin (PRAVACHOL) 20 MG tablet Take 20 mg by mouth daily.  . predniSONE (DELTASONE) 10 MG tablet Take as directed  . solifenacin (VESICARE) 5 MG tablet Take 5 mg by mouth daily.  . sucralfate (CARAFATE) 1 G tablet Take 1 g by mouth 4 (four) times daily as needed.   . traMADol (ULTRAM) 50 MG tablet Take by mouth 2 (two) times daily.  [name] venlafaxine XR (EFFEXOR-XR) 150 MG 24 hr capsule Take 150 mg by mouth at bedtime.    No facility-administered encounter medications on file as of 07/10/2014.    Allergies  Allergen Reactions  . Demerol [Meperidine] Nausea And Vomiting  . Betadine [Povidone Iodine] Rash    Immunization History  Administered Date(s) Administered  . Influenza-Unspecified 11/06/2013  . Pneumococcal Polysaccharide-23 10/07/2012    Current Medications, Allergies, Past Medical History, Past Surgical History, Family History, and Social History were reviewed in [employer] record.   Review of Systems        All symptoms NEG except where BOLDED >>  Constitutional:  F/C/S, fatigue, anorexia, unexpected weight change. HEENT:  HA, visual changes, hearing loss, earache, nasal symptoms, sore throat, mouth sores, hoarseness. Resp:  cough, sputum, hemoptysis; SOB, tightness, wheezing. Cardio:  CP, palpit, DOE, orthopnea, edema. GI:  N/V/D/C, blood in stool; reflux, abd pain, distention, gas. GU:  dysuria, freq, urgency, hematuria, flank pain,  voiding difficulty. MS:  joint pain, swelling, tenderness, decr ROM; neck pain, back pain, etc. Neuro:  HA, tremors, seizures, dizziness, syncope,  weakness, numbness, gait abn. Skin:  suspicious lesions or skin rash. Heme:  adenopathy, bruising, bleeding. Psyche:  confusion, agitation, sleep disturbance, hallucinations, anxiety, depression suicidal.   Objective:   Physical Exam      Vital Signs:  Reviewed...  General:  WD, overweight, 69 y/o WF in NAD; alert & oriented; pleasant & cooperative... HEENT:  [place]/AT; Conjunctiva- pink, Sclera- nonicteric, EOM-wnl, PERRLA, EACs-clear, TMs-wnl; NOSE-sl red; THROAT-clear & wnl, mallampati2. Neck:  Supple w/ fairROM; no JVD; normal carotid impulses w/o bruits; no thyromegaly or nodules palpated; no lymphadenopathy. Chest:  Sl decr BS at bases, scar rhonchi, w/o w/r/consolidation... Heart:  Regular Rhythm; gr1/6SEM, w/o rubs or gallops detected. Abdomen:  Soft & nontender- no guarding or rebound; normal bowel sounds; no organomegaly or masses palpated. Ext:  Sl decr ROM; without deformities mild arthritic changes; no varicose veins, +venous insuffic, tr edema;  Pulses intact w/o bruits. Neuro:  CNs II-XII intact; motor testing normal; sensory testing normal; gait normal & balance OK. Derm:  No lesions noted; no rash etc. Lymph:  No cervical, supraclavicular, axillary, or inguinal adenopathy palpated.   Assessment:     IMP >>  Persistent cough after episode of pneumonia Jan2016 -- improved w/ OTC Tylenol cold & sinus she says. COPD/ emphysema/ ex-smoker -- continue to taper off Pred, use NEB w/ Duonewb BID, contin Advair250Bid, continue Singulair10. Several subcentimeter pulm nodules seen on CT Chest -- f/u scan showed this RLL nodule appears to be improved. Hx recurrent bronchitic infe3ctions & hx pneumonia> no interval infectious exac reported.  PLAN >>  Audris is stable w/ her mod airflow obstruction from mixed COPD/chr bronchitis &  emphysema; she is way too sedentary & needs to start a formal exercise program- rec the [employer], silver sneakers, etc;  Continue current meds but use the NEBS-Bid (eg- lunch & bedtime), Advair250Bid (eg- breakfast & dinner), plus the singulair & her Tylenol cold & sinus;  She will wean off the Pred & f/u in 4-7mo     Plan:     Patient's Medications  New Prescriptions   No medications on file  Previous Medications   ALBUTEROL (PROVENTIL HFA;VENTOLIN HFA) 108 (90 BASE) MCG/ACT INHALER    Inhale 2 puffs into the lungs every 6 (six) hours as needed for wheezing or shortness of breath.   ALUM & MAG HYDROXIDE-SIMETH (MAALOX/MYLANTA) 200-200-20 MG/5ML SUSPENSION    Take 15 mLs by mouth as needed for indigestion or heartburn.   ASCORBIC ACID (VITAMIN C) 1000 MG TABLET    Take 1,000 mg by mouth daily.   ASPIRIN 81 MG TABLET    Take 81 mg by mouth daily.   BUPROPION (WELLBUTRIN SR) 150 MG 12 HR TABLET    Take 150 mg by mouth 2 (two) times daily.   CELECOXIB (CELEBREX) 200 MG CAPSULE    Take 200 mg by mouth daily.    CETIRIZINE (ZYRTEC) 10 MG TABLET    Take 10 mg by mouth daily.   DEXLANSOPRAZOLE (DEXILANT) 60 MG CAPSULE    Take 1 capsule (60 mg total) by mouth 2 (two) times daily.   FLUTICASONE-SALMETEROL (ADVAIR) 250-50 MCG/DOSE AEPB    Inhale 1 puff into the lungs every 12 (twelve) hours.   HYDROCODONE-HOMATROPINE (HYCODAN) 5-1.5 MG/5ML SYRUP    5 mLs every 6 (six) hours as needed.   IPRATROPIUM-ALBUTEROL (DUONEB) 0.5-2.5 (3) MG/3ML SOLN    Take 3 mLs by nebulization every 6 (six) hours as needed (shortness of breath).   MONTELUKAST (SINGULAIR) 10 MG TABLET  Take 10 mg by mouth daily.    NITROGLYCERIN 0.4 % OINT    Place 1 drop rectally 3 (three) times daily. FOR 21 DAYS   PRAVASTATIN (PRAVACHOL) 20 MG TABLET    Take 20 mg by mouth daily.   PREDNISONE (DELTASONE) 10 MG TABLET    Take as directed   SOLIFENACIN (VESICARE) 5 MG TABLET    Take 5 mg by mouth daily.   SUCRALFATE (CARAFATE) 1 G TABLET    Take  1 g by mouth 4 (four) times daily as needed.    TRAMADOL (ULTRAM) 50 MG TABLET    Take by mouth 2 (two) times daily.   VENLAFAXINE XR (EFFEXOR-XR) 150 MG 24 HR CAPSULE    Take 150 mg by mouth at bedtime.   Modified Medications   No medications on file  Discontinued Medications   No medications on file

## 2014-07-10 NOTE — Patient Instructions (Signed)
Today we updated your med list in our EPIC system...    Continue your current medications the same...  We discussed taking the ADVAIR twice daily (eg. Breakfast  & Dinner)    And using the NEBULIZER twice daily (eg. Lunch & Bedtime)...  OK to continue the singulair daily & use the OTC Tylenol Cold prep as needed since it appears to help you a lot...  We discussed the improtance of a regular exercise program...  [place]avor to avoid infectious exposures as much as possible...  Call for any questions...  Let's plan a follow up visit in 4-85mo sooner if needed for breathing problems..Marland KitchenMarland Kitchen

## 2014-07-18 ENCOUNTER — Encounter: Payer: Self-pay | Admitting: Gastroenterology

## 2014-10-16 ENCOUNTER — Ambulatory Visit: Payer: Medicare Other | Admitting: Gastroenterology

## 2014-10-24 ENCOUNTER — Ambulatory Visit (INDEPENDENT_AMBULATORY_CARE_PROVIDER_SITE_OTHER): Payer: BLUE CROSS/BLUE SHIELD | Admitting: Gastroenterology

## 2014-10-24 ENCOUNTER — Encounter: Payer: Self-pay | Admitting: Gastroenterology

## 2014-10-24 VITALS — BP 142/80 | HR 81 | Temp 97.6°F | Ht 70.0 in | Wt 199.8 lb

## 2014-10-24 DIAGNOSIS — K602 Anal fissure, unspecified: Secondary | ICD-10-CM

## 2014-10-24 DIAGNOSIS — K219 Gastro-esophageal reflux disease without esophagitis: Secondary | ICD-10-CM

## 2014-10-24 DIAGNOSIS — K649 Unspecified hemorrhoids: Secondary | ICD-10-CM | POA: Diagnosis not present

## 2014-10-24 DIAGNOSIS — Z860101 Personal history of adenomatous and serrated colon polyps: Secondary | ICD-10-CM

## 2014-10-24 DIAGNOSIS — Z800 Family history of malignant neoplasm of digestive organs: Secondary | ICD-10-CM

## 2014-10-24 DIAGNOSIS — Z86010 Personal history of colonic polyps: Secondary | ICD-10-CM | POA: Insufficient documentation

## 2014-10-24 NOTE — Progress Notes (Signed)
Primary Care Physician: [name], MD  Primary Gastroenterologist:  Barney Drain, MD   Chief Complaint  Patient presents with  . Follow-up    HPI: Ellen Bowers is a 69 y.o. female here for 4 month follow-up. Underwent EGD back in May for dyspepsia. Mild nonerosive gastritis noted. Biopsies benign.  Recent fissure issue but better now s/p nitroglycerin. BM regular, soft. Mylanta every night, if does not take it then she gets discomfort substernal area and cough. No longer with blocks under bed, husband didn't tolerate. Sleeping on pillow wedge but sometimes makes hemorrhoids worse. Taking Dexilant '60mg'$  daily. Minor changes in diet. No potato chips. Weight down couple of pounds. Dyspepsia much better. No melena.    Current Outpatient Prescriptions  Medication Sig Dispense Refill  . albuterol (PROVENTIL HFA;VENTOLIN HFA) 108 (90 BASE) MCG/ACT inhaler Inhale 2 puffs into the lungs every 6 (six) hours as needed for wheezing or shortness of breath.    [name] alum & mag hydroxide-simeth (MAALOX/MYLANTA) 200-200-20 MG/5ML suspension Take 15 mLs by mouth as needed for indigestion or heartburn.    . Ascorbic Acid (VITAMIN C) 1000 MG tablet Take 1,000 mg by mouth daily.    [name] aspirin 81 MG tablet Take 81 mg by mouth daily.    [name] buPROPion (WELLBUTRIN SR) 150 MG 12 hr tablet Take 150 mg by mouth 2 (two) times daily.    . celecoxib (CELEBREX) 200 MG capsule Take 200 mg by mouth daily.     . cetirizine (ZYRTEC) 10 MG tablet Take 10 mg by mouth daily.    [name] dexlansoprazole (DEXILANT) 60 MG capsule Take 1 capsule (60 mg total) by mouth 2 (two) times daily. (Patient taking differently: Take 60 mg by mouth daily. ) 180 capsule 3  . docusate sodium (COLACE) 100 MG capsule Take 100 mg by mouth 2 (two) times daily.    . Fluticasone-Salmeterol (ADVAIR) 250-50 MCG/DOSE AEPB Inhale 1 puff into the lungs every 12 (twelve) hours.    [name] ipratropium-albuterol (DUONEB) 0.5-2.5 (3) MG/3ML SOLN Take 3 mLs by  nebulization every 6 (six) hours as needed (shortness of breath).    . montelukast (SINGULAIR) 10 MG tablet Take 10 mg by mouth daily.     . Nitroglycerin 0.4 % OINT Place 1 drop rectally 3 (three) times daily. FOR 21 DAYS 15 g 1  . pravastatin (PRAVACHOL) 20 MG tablet Take 20 mg by mouth daily.    . solifenacin (VESICARE) 5 MG tablet Take 5 mg by mouth daily.    . traMADol (ULTRAM) 50 MG tablet Take by mouth 2 (two) times daily.     No current facility-administered medications for this visit.    Allergies as of 10/24/2014 - Review Complete 10/24/2014  Allergen Reaction Noted  . Demerol [meperidine] Nausea And Vomiting 06/12/2014  . Betadine [povidone iodine] Rash 05/30/2012    ROS:  General: Negative for anorexia, weight loss, fever, chills, fatigue, weakness. ENT: Negative for hoarseness, difficulty swallowing , nasal congestion. CV: Negative for chest pain, angina, palpitations, dyspnea on exertion, peripheral edema.  Respiratory: Negative for dyspnea at rest, dyspnea on exertion, cough, sputum, wheezing.  GI: See history of present illness. GU:  Negative for dysuria, hematuria, urinary incontinence, urinary frequency, nocturnal urination.  Endo: Negative for unusual weight change.    Physical Examination:   BP 142/80 mmHg  Pulse 81  Temp(Src) 97.6 F (36.4 C)  Ht '5\' 10"'$  (1.778 m)  Wt 199 lb 12.8 oz (90.629 kg)  BMI 28.67 kg/m2  General: Well-nourished, well-developed in no acute distress.  Eyes: No icterus. Mouth: Oropharyngeal mucosa moist and pink , no lesions erythema or exudate. Lungs: Clear to auscultation bilaterally.  Heart: Regular rate and rhythm, no murmurs rubs or gallops.  Abdomen: Bowel sounds are normal, nontender, nondistended, no hepatosplenomegaly or masses, no abdominal bruits or hernia , no rebound or guarding.   Extremities: No lower extremity edema. No clubbing or deformities. Neuro: Alert and oriented x 4   Skin: Warm and dry, no jaundice.     Psych: Alert and cooperative, normal mood and affect.

## 2014-10-24 NOTE — Progress Notes (Signed)
REVIEWED. AGREE. NEXT TCS MAY 2017.

## 2014-10-24 NOTE — Patient Instructions (Signed)
Continue Dexilant and Mylanta. Call with any questions or concerns. Return to the office in one year.

## 2014-10-24 NOTE — Assessment & Plan Note (Signed)
Doing well with daily Dexilant and Mylanta at bedtime. Continue antireflux measures. Return to office in one year or sooner if needed.

## 2014-10-24 NOTE — Assessment & Plan Note (Signed)
Both parents had colon cancer. Patient has personal h/o adenomatous colon polyps. She had four tubular adenoma removed from colon 05/2012. To discuss timing of next colonoscopy with Dr. Oneida Alar. ?05/2015.   Patient does well with Fentanyl and Versed sedation but failed demerol/versed previously.

## 2014-10-24 NOTE — Assessment & Plan Note (Signed)
Doing well at this time. Using NTG ointment prn.

## 2014-10-24 NOTE — Progress Notes (Signed)
CC'ED TO PCP 

## 2014-10-24 NOTE — Assessment & Plan Note (Signed)
Adequately controlled at this time. PrepH prn. Avoid straining and prolonged sitting.

## 2014-10-31 ENCOUNTER — Telehealth: Payer: Self-pay | Admitting: Gastroenterology

## 2014-10-31 MED ORDER — HYDROCORTISONE 2.5 % RE CREA: 1.0000 "application " | TOPICAL_CREAM | Freq: Two times a day (BID) | RECTAL | Status: DC

## 2014-10-31 NOTE — Telephone Encounter (Signed)
ON RECALL  °

## 2014-10-31 NOTE — Telephone Encounter (Signed)
Routing to [place], this is an [facility] pt.

## 2014-10-31 NOTE — Telephone Encounter (Signed)
RX done. 

## 2014-10-31 NOTE — Telephone Encounter (Signed)
Please let patient know that I have discussed with Dr. Oneida Alar.    Based on number of adenomas patient has had and FH of CRC, her next colonoscopy would be due 05/2015.  Please NIC for TCS 05/2015.

## 2014-10-31 NOTE — Telephone Encounter (Signed)
LMOM that Rx has been sent in.  

## 2014-10-31 NOTE — Telephone Encounter (Signed)
PT is aware. She said that she would like for Neil Crouch, PA to send her some ointment to [facility] for her hemorrhoids. She said that Magda Paganini was aware at [facility] and knew she might call if she needs something. She is requesting Proctosol HC  2.5 % ointment.

## 2014-12-27 ENCOUNTER — Telehealth: Payer: Self-pay | Admitting: Pulmonary Disease

## 2014-12-27 ENCOUNTER — Encounter: Payer: Self-pay | Admitting: Pulmonary Disease

## 2014-12-27 ENCOUNTER — Ambulatory Visit (INDEPENDENT_AMBULATORY_CARE_PROVIDER_SITE_OTHER): Payer: Medicare Other | Admitting: Pulmonary Disease

## 2014-12-27 VITALS — BP 148/90 | HR 81 | Temp 98.0°F | Ht 69.0 in | Wt 201.0 lb

## 2014-12-27 DIAGNOSIS — R05 Cough: Secondary | ICD-10-CM | POA: Diagnosis not present

## 2014-12-27 DIAGNOSIS — J449 Chronic obstructive pulmonary disease, unspecified: Secondary | ICD-10-CM

## 2014-12-27 DIAGNOSIS — R911 Solitary pulmonary nodule: Secondary | ICD-10-CM

## 2014-12-27 DIAGNOSIS — K219 Gastro-esophageal reflux disease without esophagitis: Secondary | ICD-10-CM

## 2014-12-27 DIAGNOSIS — R053 Chronic cough: Secondary | ICD-10-CM

## 2014-12-27 MED ORDER — HYDROCODONE-HOMATROPINE 5-1.5 MG/5ML PO SYRP: 5.0000 mL | ORAL_SOLUTION | Freq: Four times a day (QID) | ORAL | Status: DC | PRN

## 2014-12-27 MED ORDER — PREDNISONE 20 MG PO TABS: ORAL_TABLET | ORAL | Status: DC

## 2014-12-27 NOTE — Telephone Encounter (Signed)
Patient Instructions     Today we updated your med list in our EPIC system...  Continue your current medications the same...  We incorporated DrShaw's additional meds in these recommendations>>  Take the NEBULIZER w/ Duoneb & Budesonide twice daily at breakfast & dinner Take the [id number]- twice daily at lunch & bedtime...  Use the OTC MUCINEX '1200mg'$  twice daily & drink plenty of water...  We refilled your PREDNISONE '20mg'$  tabs to take as follows>  Start w/ one tab twice daily for 5 days,   Then decrease to 1 tab each AM for 5 days,   Then decrease to 1/2 tab each AM for 5 days,   Then decrease to 1/2 tab every other day (1/2, 0, 1/2, 0, etc) til gone...  We also refilled your HYCODAN cough syrup to use as needed...  Call for any questions...  Let's plan a follow up visit in 4-16mo sooner if needed for problems...    Spoke with Ellen Bowers Ellen Bowers Verified how pt is to take her prednisone. Nothing further was needed.

## 2014-12-27 NOTE — Patient Instructions (Signed)
Today we updated your med list in our EPIC system...    Continue your current medications the same...    We incorporated DrShaw's additional meds in these recommendations>>  Take the NEBULIZER w/ Duoneb & Budesonide twice daily at breakfast & dinner Take the [id number]- twice daily at lunch & bedtime...  Use the OTC MUCINEX '1200mg'$  twice daily & drink plenty of water...  We refilled your PREDNISONE '20mg'$  tabs to take as follows>    Start w/ one tab twice daily for 5 days,   Then decrease to 1 tab each AM for 5 days,   Then decrease to 1/2 tab each AM for 5 days,   Then decrease top 1/2 tab every other day (1/2, 0, 1/2, 0, etc) til gone...  We also refilled your HYCODAN cough syrup to use as needed...  Call for any questions...  Let's plan a follow up visit in 4-53mo sooner if needed for problems..Marland KitchenMarland Kitchen

## 2014-12-27 NOTE — Progress Notes (Signed)
Subjective:     Patient ID: Ellen Bowers, female   DOB: 16-Sep-1945, 69 y.o.   MRN: [id number]  HPI ~  Initial consult 06/07/14 by SN >>       74 y/o WF, retired Merchandiser, retail, referred by [facility] in [place] for pulmonary evaluation> She presented to her PCP w/ 3wk hx productive cough on 05/08/14 assoc w/ PND, hoarseness, HA & some wheezing;  She is an ex-smoker w/ a hx COPD- centribolular emphysema and scattered sub-centimeter pulm nodules seen on CT Jan2015;  Prev PFTs revealed mod airflow obstruction w/ FEV1=2.10 (78%) and %1sec=63;  She has been on treatment from [place] w/ ADVAIR250-Bid, Albut rescue inhaler prn, NEB w/ Duoneb prn, Singulair10...       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, retail and an ex-smoker- having started smoking in her teens and smoked for ~72yr up to 2ppd transiently (1ppd was ave), and quit in 2011 w/ the help of [employer];  She denies resp symptoms while she was smoking- no cough/ sput/ hemoptysis or SOB; she notes that she'd get bronchitis once or twice a yr, and she was told about "asthma" many yrs ago;  When pressed she notes current symptoms had their onset ~170yrgo w/ progressive cough & says it's been worse ever [facility] (treated on [facility] w/ shot, Antibiotic & Prednisone; she is really in denial about her COPD- wondering if her symptoms are due to allergies (but present all yr), and wonders if it's reflux related (but not worse qhs), and says she wants to be sure it's not something else... Current symptoms include> SOB/DOE w/ mild activities like stairs, walking on incline, or if rushed but denies prob w/ ADLs etc;  She notes cough, now mostly dry, occas clear sput, no hempotysis;  She has no known occupational exposures and NEG FamHx for lung dis...       EXAM reveals Afeb, VSS, O2sat=94% on RA;  HEENT- neg, mallampati2;  Chest- decrBS bilat, few scat rhonchi at bases, no w/r/ consolidation;  Heart- RR w/o m/r/g;  Abd- soft/ neg;  Ext- w/o c/c/e;   Neuro- intact w/o focal abn...  [place], great vessels, & coronaries, mild diffuse bronchial wall thickening & centrilobular emphysema, several scattered nonspecific subcentimeter pulm nodules (largest=95m59mn medial RLL), no adenopathy; (Note> f/u CT was rec in 6-50m48morecheck these nodules)  CT Chest w/ contrast [place], centrilobular emphysema in upper lobes bilat, stable 95mm 57m nodule, no pathologically enlarged LNs...   [place]/14 [place] heart size, COPD & patchy interstitial prominence on right esp RUL concerning for multilobar pneumonia  PFTs [place]/14 [place] FVC=3.30 (96%), FEV1=2.10 (78%), %1sec=63, mid-flows reduced at 31% predicted; Lung Volumes showed TLC=5.81 (100%), RV=2.51 (108%), RV/TLC=43%; DLCO=63% predicted but corrected to 109% with alveolar ventilation correction... This is c/w moderate airflow obstruction and GOLD Stage 2 COPD...   [facility] [place] wnl (4%eos-300absolute);  Chems- wnl;  TSH=4.58  She is due for f/u CXR & CT Chest 6/16> [facility]> ordered & pending ==>              CXR 6/16 showed norm heart size, underlying COPD/hyperinflation, scarring in apicies, otherw clear/ wnl/ NAD...   Marland KitchenMarland Kitchen        CT Chest 6/16 showed norm heart size, arteriosclerotic changes  in Ao, branch vessels, & coronaries; lungs show emphysematous changes & prev 48m nodule in RLL is smaller & in an area of scarring, no adenopathy...   Additional LABS- IgE, RAST panel, A1AT level> 05/2014==> IgE=19, RAST panel all neg x +Candida;  A1AT=141 w/ phenotype MM.                      CXR 06/13/14                                           CT Chest 06/13/14     IMP/PLAN>>  RHumnais a recent ex-smoker w/ a signif past smoking hx; she has underlying COPD/ Emphysema and a chronic cough that has been hard to shake since the pneumonia in Jan2016;   I have recommended a tapering course of oral PREDNISONE along w/ her ADVAIR250, DUONEBS Tid, Singulair10, and Hycodan as needed... She has f/u CXR, CT Chest, & some additional labs ordered & pending (see above)...    ~  July 10, 2014:  177moOV w/ SN >> RoAquilatates that she's doing a little bit better, but she attributes the improvement to OTC Tylenol cold & sinus relief "it works better that [employer]  She continues to work as a hoMerchandiser, retail.     COPD/emphysema, ex-smoker, pulm nodule, recurrent bronchitic infections, hx pneumonia> we treated her w/ a Prednisone taper (down to 1/2 tab Qod til gone), Advair250-Bid, Duoneb Tid, Singulair10, Hycodan prn; f/u scan showed the RLL nodule appears smaller, she feels that OTC Tylenol cold&sinus helped more, using Advair250Bid, using the NEB just once/d Qhs, on Singulair10/d & AlbutHFA prn (ave 2-3x per wk)...    Atherosclerosis seen on CT scans> on ASA81 & has NTG for prn use from her PCP...    Hyperchol> on Prav20 from her PCP    GERD, hx HPylori pos gastritis, hx colon polyps> [place] Dex[place]60, Maalox, Carafate,,,    Hx Von Willebrand's dis> details unknown, no data avail in Epic... We reviewed prob list, meds, xrays and labs>   PLAN>>  [name] is stable w/ her mod airflow obstruction from mixed COPD/chr bronchitis & emphysema; she is way too sedentary & needs to start a formal exercise program- rec the [employer], silver sneakers, etc;  Continue current meds but use the NEBS-Bid (eg- lunch & bedtime), Advair250Bid (eg- breakfast & dinner), plus the singulair & her Tylenol cold & sinus;  She will wean off the Pred & f/u in 4-63m67mo  ~  December 27, 2014:  4-18mo28mo & [name] that her breathing is about the same & notes some cough/ white mucous/ SOB & DOE w/o change;  [name] she tells me that he has changed her meds- taking ADVAIR500Bid, NEBULIZER w/ Budes Bid (but only using it at  night) & Duoneb Qid prn, plus Singulair10;  Previously we had recommended Advair250Bid at breakfast & dinner, plus the Duoneb bid at lunch & bedtime; she does not want to take Prednisone...     COPD/emphysema, ex-smoker, pulm nodule, recurrent bronchitic infections, hx pneumonia> we treated her w/ a Prednisone taper (down to 1/2 tab Qod til gone), Advair250-Bid, Duoneb Tid, Singulair10, Hycodan prn; f/u scan showed the RLL nodule appears smaller, she feels that OTC Tylenol cold&sinus helped more; [name]  Advair500Bid + NEBS w/ Budes Bid & Duoneb Qid prn...    Atherosclerosis seen on CT scans> on ASA81 & has NTG for prn use from her PCP...    Hyperchol> on Prav20 from her PCP    GERD, hx HPylori pos gastritis, hx colon polyps> followed by DrSFields in [place] on Dexilant60, Maalox, Carafate,,,    Hx Von Willebrand's dis> details unknown, no data avail in Epic... EXAM reveals Afeb, VSS, O2sat=96% on RA;  HEENT- neg, mallampati2;  Chest- decrBS bilat, few scat rhonchi at bases, no w/r/ consolidation;  Heart- RR w/o m/r/g;  Abd- soft/ neg;  Ext- w/o c/c/e;  Neuro- intact w/o focal abn... IMP/PLAN>>  We discussed her meds & REC ADVAIR500Bid, NEBS w/ Duoneb & Budes Bid, Singulair10, Mucinex1200Bid, fluids, etc; we reviewed Pred tapering sched;  ROV in 32mo sooner prn...     Past Medical History  Diagnosis Date  . Asthma   . COPD (chronic obstructive pulmonary disease) >> she takes DEXILANT60Bid    . GERD (gastroesophageal reflux disease)   . Arthritis >> she says she cannot do w/o CELEBREX200Bid & Tramadol50 prn   . Von Willebrand disease 1996  . Helicobacter pylori gastritis  2005    Dysthymia >> on Wellbutrin150Bid & EffexorER150    Past Surgical History  Procedure Laterality Date  . Tubal ligation  1984  . Neck surgery  1951    cyst removal  . Tonsillectomy  1951  . Cataract extraction      double  . Colonoscopy N/A 05/30/2012    Dr.Mann:Multiple  polyps removed from the right colon-see description above; otherwise normal colonoscopy up to the cecum. 5 polyps, multiple tubular adenomas. next tcs 05/2015  . Neck surgery  1998    growth removed  . Esophagogastroduodenoscopy  2012    Dr. MCollene Mares few erosions in duodenal bulb. BRAVO off PPI ("severe reflux")  . Flexible sigmoidoscopy N/A 02/05/2014    Dr. fOneida Alar 5, 2-3 mm sessile sigmoid colon polyps removed, small internal hemorrhoids, moderate external hemorrhoids, 2 anal fissures present. Polyps were hyperplastic.   .Marland KitchenEsophagogastroduodenoscopy N/A 06/12/2014    SLF: 1. The mucosa of the esophagus appeared normal 2. Mild non-erosive gastrtitis.     Outpatient Encounter Prescriptions as of 12/27/2014  Medication Sig  . albuterol (PROVENTIL HFA;VENTOLIN HFA) 108 (90 BASE) MCG/ACT inhaler Inhale 2 puffs into the lungs every 6 (six) hours as needed for wheezing or shortness of breath.  .Marland Kitchenalum & mag hydroxide-simeth (MAALOX/MYLANTA) 200-200-20 MG/5ML suspension Take 15 mLs by mouth as needed for indigestion or heartburn.  . Ascorbic Acid (VITAMIN C) 1000 MG tablet Take 1,000 mg by mouth daily.  .Marland Kitchenaspirin 81 MG tablet Take 81 mg by mouth daily.  . budesonide (PULMICORT) 0.5 MG/2ML nebulizer solution Take 0.5 mg by nebulization 2 (two) times daily.  .Marland KitchenbuPROPion (WELLBUTRIN SR) 150 MG 12 hr tablet Take 150 mg by mouth 2 (two) times daily.  . celecoxib (CELEBREX) 200 MG capsule Take 200 mg by mouth daily.   . cetirizine (ZYRTEC) 10 MG tablet Take 10 mg by mouth daily.  .Marland Kitchendexlansoprazole (DEXILANT) 60 MG capsule Take 1 capsule (60 mg total) by mouth 2 (two) times daily. (Patient taking differently: Take 60 mg by mouth daily. )  . docusate sodium (COLACE) 100 MG capsule Take 100 mg by mouth 2 (two) times daily.  . Fluticasone-Salmeterol (ADVAIR) 250-50 MCG/DOSE AEPB Inhale 1 puff into the lungs every 12 (twelve) hours.  .Marland KitchenHYDROcodone-homatropine (HYCODAN) 5-1.5 MG/5ML syrup Take 5  mLs by mouth every 6  (six) hours as needed for cough.  . hydrocortisone (PROCTOSOL HC) 2.5 % rectal cream Place 1 application rectally 2 (two) times daily. (Patient taking differently: Place 1 application rectally 2 (two) times daily as needed. )  . ipratropium-albuterol (DUONEB) 0.5-2.5 (3) MG/3ML SOLN Take 3 mLs by nebulization every 6 (six) hours as needed (shortness of breath).  . montelukast (SINGULAIR) 10 MG tablet Take 10 mg by mouth daily.   . Nitroglycerin 0.4 % OINT Place 1 drop rectally 3 (three) times daily. FOR 21 DAYS (Patient taking differently: Place 1 drop rectally 3 (three) times daily as needed. FOR 21 DAYS)  . pravastatin (PRAVACHOL) 20 MG tablet Take 20 mg by mouth daily.  . solifenacin (VESICARE) 5 MG tablet Take 5 mg by mouth daily.  . traMADol (ULTRAM) 50 MG tablet Take by mouth 2 (two) times daily.    Allergies  Allergen Reactions  . Demerol [Meperidine] Nausea And Vomiting  . Betadine [Povidone Iodine] Rash    Immunization History  Administered Date(s) Administered  . Influenza,inj,Quad PF,36+ Mos 10/22/2014  . Influenza-Unspecified 11/06/2013  . Pneumococcal Conjugate-13 11/26/2014  . Pneumococcal Polysaccharide-23 10/07/2012    Current Medications, Allergies, Past Medical History, Past Surgical History, Family History, and Social History were reviewed in [employer] record.   Review of Systems        All symptoms NEG except where BOLDED >>  Constitutional:  F/C/S, fatigue, anorexia, unexpected weight change. HEENT:  HA, visual changes, hearing loss, earache, nasal symptoms, sore throat, mouth sores, hoarseness. Resp:  cough, sputum, hemoptysis; SOB, tightness, wheezing. Cardio:  CP, palpit, DOE, orthopnea, edema. GI:  N/V/D/C, blood in stool; reflux, abd pain, distention, gas. GU:  dysuria, freq, urgency, hematuria, flank pain, voiding difficulty. MS:  joint pain, swelling, tenderness, decr ROM; neck pain, back pain, etc. Neuro:  HA, tremors,  seizures, dizziness, syncope, weakness, numbness, gait abn. Skin:  suspicious lesions or skin rash. Heme:  adenopathy, bruising, bleeding. Psyche:  confusion, agitation, sleep disturbance, hallucinations, anxiety, depression suicidal.   Objective:   Physical Exam      Vital Signs:  Reviewed...  General:  WD, overweight, 69 y/o WF in NAD; alert & oriented; pleasant & cooperative... HEENT:  [place]/AT; Conjunctiva- pink, Sclera- nonicteric, EOM-wnl, PERRLA, EACs-clear, TMs-wnl; NOSE-sl red; THROAT-clear & wnl, mallampati2. Neck:  Supple w/ fairROM; no JVD; normal carotid impulses w/o bruits; no thyromegaly or nodules palpated; no lymphadenopathy. Chest:  Sl decr BS at bases, scar rhonchi, w/o w/r/consolidation... Heart:  Regular Rhythm; gr1/6SEM, w/o rubs or gallops detected. Abdomen:  Soft & nontender- no guarding or rebound; normal bowel sounds; no organomegaly or masses palpated. Ext:  Sl decr ROM; without deformities mild arthritic changes; no varicose veins, +venous insuffic, tr edema;  Pulses intact w/o bruits. Neuro:  CNs II-XII intact; motor testing normal; sensory testing normal; gait normal & balance OK. Derm:  No lesions noted; no rash etc. Lymph:  No cervical, supraclavicular, axillary, or inguinal adenopathy palpated.   Assessment:     IMP >>     Persistent cough after episode of pneumonia Jan2016 -- improved w/ OTC Tylenol cold & sinus she says.    COPD/ emphysema/ ex-smoker -- continue to taper off Pred, use NEB w/ Duoneb BID, contin Advair500Bid, continue Singulair10.    Several subcentimeter pulm nodules seen on CT Chest -- f/u scan showed this RLL nodule appears to be improved.    Hx recurrent bronchitic infe3ctions & hx pneumonia> no interval infectious  exac reported.  PLAN >>  Ellen Bowers is stable w/ her mod airflow obstruction from mixed COPD/chr bronchitis & emphysema; she is way too sedentary & needs to start a formal exercise program- rec the [employer], silver sneakers, etc;   Continue current meds but use the NEBS-Bid (eg- lunch & bedtime), Advair500Bid (eg- breakfast & dinner), plus the singulair & her Tylenol cold & sinus;  She will wean off the Pred & f/u in 4-57mo     Plan:     Patient's Medications  New Prescriptions   PREDNISONE (DELTASONE) 20 MG TABLET    Take as directed.  Previous Medications   ALBUTEROL (PROVENTIL HFA;VENTOLIN HFA) 108 (90 BASE) MCG/ACT INHALER    Inhale 2 puffs into the lungs every 6 (six) hours as needed for wheezing or shortness of breath.   ALUM & MAG HYDROXIDE-SIMETH (MAALOX/MYLANTA) 200-200-20 MG/5ML SUSPENSION    Take 15 mLs by mouth as needed for indigestion or heartburn.   ASCORBIC ACID (VITAMIN C) 1000 MG TABLET    Take 1,000 mg by mouth daily.   ASPIRIN 81 MG TABLET    Take 81 mg by mouth daily.   BUDESONIDE (PULMICORT) 0.5 MG/2ML NEBULIZER SOLUTION    Take 0.5 mg by nebulization 2 (two) times daily.   BUPROPION (WELLBUTRIN SR) 150 MG 12 HR TABLET    Take 150 mg by mouth 2 (two) times daily.   CELECOXIB (CELEBREX) 200 MG CAPSULE    Take 200 mg by mouth daily.    CETIRIZINE (ZYRTEC) 10 MG TABLET    Take 10 mg by mouth daily.   DEXLANSOPRAZOLE (DEXILANT) 60 MG CAPSULE    Take 1 capsule (60 mg total) by mouth 2 (two) times daily.   DOCUSATE SODIUM (COLACE) 100 MG CAPSULE    Take 100 mg by mouth 2 (two) times daily.   FLUTICASONE-SALMETEROL (ADVAIR) 250-50 MCG/DOSE AEPB    Inhale 1 puff into the lungs every 12 (twelve) hours.   HYDROCORTISONE (PROCTOSOL HC) 2.5 % RECTAL CREAM    Place 1 application rectally 2 (two) times daily.   IPRATROPIUM-ALBUTEROL (DUONEB) 0.5-2.5 (3) MG/3ML SOLN    Take 3 mLs by nebulization every 6 (six) hours as needed (shortness of breath).   MONTELUKAST (SINGULAIR) 10 MG TABLET    Take 10 mg by mouth daily.    NITROGLYCERIN 0.4 % OINT    Place 1 drop rectally 3 (three) times daily. FOR 21 DAYS   PRAVASTATIN (PRAVACHOL) 20 MG TABLET    Take 20 mg by mouth daily.   SOLIFENACIN (VESICARE) 5 MG TABLET     Take 5 mg by mouth daily.   TRAMADOL (ULTRAM) 50 MG TABLET    Take by mouth 2 (two) times daily.  Modified Medications   Modified Medication Previous Medication   HYDROCODONE-HOMATROPINE (HYCODAN) 5-1.5 MG/5ML SYRUP HYDROcodone-homatropine (HYCODAN) 5-1.5 MG/5ML syrup      Take 5 mLs by mouth every 6 (six) hours as needed for cough.    Take 5 mLs by mouth every 6 (six) hours as needed for cough.  Discontinued Medications   No medications on file

## 2015-04-03 ENCOUNTER — Encounter: Payer: Self-pay | Admitting: Gastroenterology

## 2015-05-28 ENCOUNTER — Ambulatory Visit: Payer: Medicare Other | Admitting: Pulmonary Disease

## 2015-06-21 ENCOUNTER — Ambulatory Visit (HOSPITAL_COMMUNITY): Payer: Medicare Other | Attending: Orthopedic Surgery | Admitting: Physical Therapy

## 2015-06-21 DIAGNOSIS — M79662 Pain in left lower leg: Secondary | ICD-10-CM

## 2015-06-21 DIAGNOSIS — R262 Difficulty in walking, not elsewhere classified: Secondary | ICD-10-CM

## 2015-06-21 DIAGNOSIS — M25674 Stiffness of right foot, not elsewhere classified: Secondary | ICD-10-CM

## 2015-06-21 DIAGNOSIS — M25675 Stiffness of left foot, not elsewhere classified: Secondary | ICD-10-CM | POA: Diagnosis present

## 2015-06-21 DIAGNOSIS — M79661 Pain in right lower leg: Secondary | ICD-10-CM

## 2015-06-21 NOTE — Therapy (Signed)
[facility] [address] [place], [place], [address] Phone: [phone]   Fax:  [phone]  Physical Therapy Evaluation  Patient Details  Name: Ellen Bowers MRN: [id number] Date of Birth: 01-Mar-1945 Referring Provider: Dr. Donette Larry  Encounter Date: 06/21/2015      PT End of Session - 06/21/15 1425    Visit Number 1   Number of Visits 12   Date for PT Re-Evaluation 07/21/15   Authorization Type Medicare   PT Start Time 1340   PT Stop Time 1425   PT Time Calculation (min) 45 min   Activity Tolerance Patient tolerated treatment well   Behavior During Therapy [facility] for tasks assessed/performed      Past Medical History  Diagnosis Date  . Asthma   . COPD (chronic obstructive pulmonary disease) ([place])   . GERD (gastroesophageal reflux disease)   . Arthritis   . Von Willebrand disease ([place]) 1996  . Helicobacter pylori gastritis 2005    treated    Past Surgical History  Procedure Laterality Date  . Tubal ligation  1984  . Neck surgery  1951    cyst removal  . Tonsillectomy  1951  . Cataract extraction      double  . Colonoscopy N/A 05/30/2012    Dr.Mann:Multiple polyps removed from the right colon-see description above; otherwise normal colonoscopy up to the cecum. 5 polyps, multiple tubular adenomas. next tcs 05/2015  . Neck surgery  1998    growth removed  . Esophagogastroduodenoscopy  2012    Dr. Collene Mares: few erosions in duodenal bulb. BRAVO off PPI ("severe reflux")  . Flexible sigmoidoscopy N/A 02/05/2014    Dr. Oneida Alar: 5, 2-3 mm sessile sigmoid colon polyps removed, small internal hemorrhoids, moderate external hemorrhoids, 2 anal fissures present. Polyps were hyperplastic.   [name] Esophagogastroduodenoscopy N/A 06/12/2014    SLF: 1. The mucosa of the esophagus appeared normal 2. Mild non-erosive gastrtitis.     There were no vitals filed for this visit.       Subjective Assessment - 06/21/15 1341    Subjective [name] states that she has been experiencing heel pain for about a year.  The pain is now constanct wherer it was intermittent.  She has noticed that it the past month the pain has affected her walking.  She states that she has had two injections in her left heel that helped temporarily.  Both benefited her pain for about four months.  Now her right heel is bothering her just as much as her left heel.  She has been given a gastroc stretch as well as plantar fascua stretches which has not seemed to change her pain.  She had an MRI in [place] which showed a partial tear of her plantar fascia.  She is now being referred for skilled physical therapy to try and improve her pain and ability to walk.     Pertinent History COPD, asthma; OA   Limitations Standing;Walking;House hold activities   How long can you sit comfortably? no problem    How long can you stand comfortably? Pain after five minutes:  Pt is unable to do all of the dishes    How long can you walk comfortably? less than five minutes    Patient Stated Goals less pain, to be able to walk longer, stand longer    Currently in Pain? Yes   Pain Score 2   worst pain 6/10; best 1/10 ; 10/10 after being up for 45 minutes.  Pain Location Ankle   Pain Orientation --  left greater than right    Pain Descriptors / Indicators Sharp;Throbbing   Pain Type Chronic pain   Pain Frequency Constant   Aggravating Factors  standing, walking    Pain Relieving Factors nothing             [facility] PT Assessment - 06/21/15 0001    Assessment   Medical Diagnosis Bilateral foot pain   Referring Provider Dr. Donette Larry   Onset Date/Surgical Date 05/13/15   Next MD Visit unknown   Prior Therapy none   Precautions   Precautions None   Restrictions   Weight Bearing Restrictions No   Balance Screen   Has the patient fallen in the past 6 months No   Has the patient had a decrease in activity level because of a fear of falling?  No    Is the patient reluctant to leave their home because of a fear of falling?  No   Home Ec[employer] residence   Prior Function   Level of Independence Independent   Cognition   Overall Cognitive Status Within Functional Limits for tasks assessed   Observation/Other Assessments   Focus on Therapeutic Outcomes (FOTO)  47   Functional Tests   Functional tests Single leg stance   Single Leg Stance   Comments Rt: 10 ; Lt 4    ROM / Strength   AROM / PROM / Strength AROM;Strength   AROM   AROM Assessment Site Ankle;Knee   Right/Left Knee Right;Left   Right Knee Extension -10   Left Knee Extension -7   Right/Left Ankle Right;Left   Right Ankle Dorsiflexion 8   Right Ankle Plantar Flexion 40   Right Ankle Inversion 28   Right Ankle Eversion 20   Left Ankle Dorsiflexion 8   Left Ankle Plantar Flexion 58   Left Ankle Inversion 20   Left Ankle Eversion 15   Strength   Strength Assessment Site Ankle   Right/Left Ankle Right;Left   Right Ankle Dorsiflexion 5/5   Right Ankle Plantar Flexion 4/5   Right Ankle Inversion 4/5   Right Ankle Eversion 5/5   Left Ankle Dorsiflexion 5/5   Left Ankle Plantar Flexion 4/5   Left Ankle Inversion 4/5   Left Ankle Eversion 5/5   Palpation   Palpation comment tight mm in B gastroc                    [facility] Adult PT Treatment/Exercise - 06/21/15 0001    Exercises   Exercises Ankle   Manual Therapy   Manual Therapy Soft tissue mobilization   Soft tissue mobilization Bilateral gastroc/soleus complex    Ankle Exercises: Stretches   Plantar Fascia Stretch 2 reps;30 seconds   Gastroc Stretch 2 reps;30 seconds                PT Education - 06/21/15 1424    Education provided Yes   Education Details HEP   Person(s) Educated Patient   Methods Explanation;Handout;Verbal cues;Demonstration   Comprehension Verbalized understanding;Returned demonstration          PT Short Term Goals - 06/21/15  1617    PT SHORT TERM GOAL #1   Title Pt to be independent in HEP to allow pt to be able to stand for 15 mintues without increased pain in bilateral heels  to be able to complete dishes    Time 3   Period Weeks  Status New   PT SHORT TERM GOAL #2   Title Pt to be able to walk for 15 minutes without increased pain in bilateral heels to be able to complete short shopping trips in comfort   Time 3   Period Weeks   Status New   PT SHORT TERM GOAL #3   Title Pt pain to be no greater than a 5/10 in order to have no difficulty getting to sleep.    Time 3   Period Weeks   Status New           PT Long Term Goals - 06/21/15 1619    PT LONG TERM GOAL #1   Title Pt to be in advance HEP in order to decrease  bilateral heel pain to no greater than a 2/10   Time 6   Period Weeks   Status New   PT LONG TERM GOAL #2   Title Pt to be able to stand for 30 minutes withou increased bilateral heel pain in order to be able to cook a meal.   Time 6   Period Weeks   Status New   PT LONG TERM GOAL #3   Title Pt to be able to walk for 30 minutes without increased heel pain to return to walking for exercise regieme for healthier lifestyle.    Time 6   Period Weeks   Status New   PT LONG TERM GOAL #4   Title Pt to be able to complete her housework without having to rest due to increase heel pain    Time 6   Period Weeks   Status New               Plan - 06/21/15 1430    Clinical Impression Statement Ms. Maya is a 70 yo female who states that she has had heel pain in her Lt heel for about a year and in her Rt heel for about six months.  She has had injections and has been given stretching exercises for her pain with minimal lasting relief.  Her pain is limiting her ability to stand and walk.  She is now being referred to skilled physical therapy to attempt to improve both her pain and her functioning ability.  Examination demonstrates tight mm, decreased ROM, decreased strength ,  decreased activity tolerance and  increased pain.   Ms. Heaps will benefit from skilled physical therapy to address these issues decreased her pain and improve her activity tolerance to improve her quality of life.    Rehab Potential Fair   PT Frequency 2x / week   PT Duration 6 weeks   PT Treatment/Interventions Ultrasound;ADLs/Self Care Home Management;Iontophoresis '4mg'$ /ml Dexamethasone;Moist Heat;Gait training;Functional mobility training;Therapeutic activities;Therapeutic exercise;Patient/family education;Manual techniques   PT Next Visit Plan Begin ultrasound to B heel/ distal gastrc tendon, add hamstring stretch, slant board stretch continue with manual    PT Home Exercise Plan given    Consulted and Agree with Plan of Care Patient      Patient will benefit from skilled therapeutic intervention in order to improve the following deficits and impairments:  Decreased activity tolerance, Decreased balance, Decreased strength, Decreased range of motion, Difficulty walking, Pain  Visit Diagnosis: Pain in right lower leg - Plan: PT plan of care cert/re-cert  Pain in left lower leg - Plan: PT plan of care cert/re-cert  Difficulty in walking, not elsewhere classified - Plan: PT plan of care cert/re-cert  Stiffness of right foot, not elsewhere classified - Plan:  PT plan of care cert/re-cert  Stiffness of left foot, not elsewhere classified - Plan: PT plan of care cert/re-cert      G-Codes - 16/83/72 1627    Functional Limitation Mobility: Walking and moving around   Mobility: Walking and Moving Around Current Status [phone]) At least 40 percent but less than 60 percent impaired, limited or restricted   Mobility: Walking and Moving Around Goal Status [phone]) At least 40 percent but less than 60 percent impaired, limited or restricted       Problem List Patient Active Problem List   Diagnosis Date Noted  . Anorectal fissure 10/24/2014  . FH: colon cancer 10/24/2014  . Hx of  adenomatous colonic polyps 10/24/2014  . History of pneumonia 06/07/2014  . COPD mixed type ([place]) 06/07/2014  . Pulmonary nodule 06/07/2014  . Abdominal pain, epigastric 05/07/2014  . Hemorrhoids 05/07/2014  . Rectal bleeding   . Rectal pain   . Rectal pain, chronic 12/28/2013  . GERD (gastroesophageal reflux disease) 09/06/2013  . Chronic cough 09/06/2013    Rayetta Humphrey, PT CLT [phone] 06/21/2015, 4:32 PM  [facility] [address] [place], [place], [address] Phone: [phone]   Fax:  [phone]  Name: HOLLYNN GARNO MRN: [id number] Date of Birth: December 06, 1945

## 2015-06-21 NOTE — Patient Instructions (Signed)
Stretching: Hamstring (Supine)    Supporting right thigh behind knee, slowly straighten knee until stretch is felt in back of thigh. Hold __30__ seconds. Repeat ___3_ times per set. Do _1___ sets per session. Do ___2_ sessions per day.  http://orth.exer.us/656   Copyright  VHI. All rights reserved.

## 2015-06-24 ENCOUNTER — Ambulatory Visit (HOSPITAL_COMMUNITY): Payer: Medicare Other | Admitting: Physical Therapy

## 2015-06-26 ENCOUNTER — Encounter (HOSPITAL_COMMUNITY): Payer: Medicare Other | Admitting: Physical Therapy

## 2015-06-26 ENCOUNTER — Ambulatory Visit (HOSPITAL_COMMUNITY): Payer: Medicare Other | Admitting: Physical Therapy

## 2015-06-26 DIAGNOSIS — M25674 Stiffness of right foot, not elsewhere classified: Secondary | ICD-10-CM

## 2015-06-26 DIAGNOSIS — M79662 Pain in left lower leg: Secondary | ICD-10-CM

## 2015-06-26 DIAGNOSIS — M79661 Pain in right lower leg: Secondary | ICD-10-CM

## 2015-06-26 DIAGNOSIS — R262 Difficulty in walking, not elsewhere classified: Secondary | ICD-10-CM

## 2015-06-26 DIAGNOSIS — M25675 Stiffness of left foot, not elsewhere classified: Secondary | ICD-10-CM

## 2015-06-26 NOTE — Patient Instructions (Signed)
Hamstring Stretch (Standing)    Standing, place one heel on chair or bench. Use one or both hands on thigh for support. Keeping torso straight, lean forward slowly until a stretch is felt in back of same thigh. Hold __30__ seconds. Repeat with other leg.   HIP: Hamstrings - Long Sitting    Place one leg on surface with knee straight. Lean forward keeping back straight. Hold _30__ seconds.

## 2015-06-26 NOTE — Therapy (Signed)
[facility] [place], [place], [address] Phone: [phone]   Fax:  [phone]  Physical Therapy Treatment  Patient Details  Name: Ellen Bowers MRN: [id number] Date of Birth: Jun 10, 1945 Referring Provider: Dr. Donette Larry  Encounter Date: 06/26/2015      PT End of Session - 06/26/15 1442    Visit Number 2   Number of Visits 12   Date for PT Re-Evaluation 07/21/15   Authorization Type Medicare   Authorization - Visit Number 2   Authorization - Number of Visits 10   PT Start Time 1350   PT Stop Time 1436   PT Time Calculation (min) 46 min   Activity Tolerance Patient tolerated treatment well   Behavior During Therapy [facility] for tasks assessed/performed      Past Medical History  Diagnosis Date  . Asthma   . COPD (chronic obstructive pulmonary disease) ([place])   . GERD (gastroesophageal reflux disease)   . Arthritis   . Von Willebrand disease ([place]) 1996  . Helicobacter pylori gastritis 2005    treated    Past Surgical History  Procedure Laterality Date  . Tubal ligation  1984  . Neck surgery  1951    cyst removal  . Tonsillectomy  1951  . Cataract extraction      double  . Colonoscopy N/A 05/30/2012    Dr.Mann:Multiple polyps removed from the right colon-see description above; otherwise normal colonoscopy up to the cecum. 5 polyps, multiple tubular adenomas. next tcs 05/2015  . Neck surgery  1998    growth removed  . Esophagogastroduodenoscopy  2012    Dr. Collene Mares: few erosions in duodenal bulb. BRAVO off PPI ("severe reflux")  . Flexible sigmoidoscopy N/A 02/05/2014    Dr. Oneida Alar: 5, 2-3 mm sessile sigmoid colon polyps removed, small internal hemorrhoids, moderate external hemorrhoids, 2 anal fissures present. Polyps were hyperplastic.   [name] Esophagogastroduodenoscopy N/A 06/12/2014    SLF: 1. The mucosa of the esophagus appeared normal 2. Mild non-erosive gastrtitis.     There were no vitals filed for  this visit.      Subjective Assessment - 06/26/15 1750    Subjective Pt states her feet continue to hurt, however can tell the exercises will help them.  Pt states one exercise she was given for HEP hurts her back to complete (HS stretch: Pt was shown alternative ways to complete that did not hurt back).     Currently in Pain? Yes   Pain Score 2    Pain Location Ankle   Pain Orientation Right;Left   Pain Descriptors / Indicators Throbbing   Pain Type Chronic pain                                 PT Education - 06/26/15 1751    Education provided Yes   Education Details reviewed HEP and goals, given copy of initial evaluation   Person(s) Educated Patient   Methods Explanation;Tactile cues;Demonstration;Verbal cues;Handout   Comprehension Verbalized understanding;Returned demonstration;Verbal cues required;Tactile cues required          PT Short Term Goals - 06/26/15 1752    PT SHORT TERM GOAL #1   Title Pt to be independent in HEP to allow pt to be able to stand for 15 mintues without increased pain in bilateral heels  to be able to complete dishes    Time 3   Period Weeks  Status On-going   PT SHORT TERM GOAL #2   Title Pt to be able to walk for 15 minutes without increased pain in bilateral heels to be able to complete short shopping trips in comfort   Time 3   Period Weeks   Status On-going   PT SHORT TERM GOAL #3   Title Pt pain to be no greater than a 5/10 in order to have no difficulty getting to sleep.    Time 3   Period Weeks   Status On-going           PT Long Term Goals - 06/26/15 1752    PT LONG TERM GOAL #1   Title Pt to be in advance HEP in order to decrease  bilateral heel pain to no greater than a 2/10   Time 6   Period Weeks   Status On-going   PT LONG TERM GOAL #2   Title Pt to be able to stand for 30 minutes withou increased bilateral heel pain in order to be able to cook a meal.   Time 6   Period Weeks   Status  On-going   PT LONG TERM GOAL #3   Title Pt to be able to walk for 30 minutes without increased heel pain to return to walking for exercise regieme for healthier lifestyle.    Time 6   Period Weeks   Status On-going   PT LONG TERM GOAL #4   Title Pt to be able to complete her housework without having to rest due to increase heel pain    Time 6   Period Weeks   Status On-going               Plan - 06/26/15 1443    Clinical Impression Statement Reviewed eval and HEP.  Added slant board stretch and instructed in alternate ways to complete hamstring stretch due to lumbar pain in supine.  Pt given written illustrations for exercises as well.  Began [place] for bilateral heels and distal gastroc tendons and continued with manual to these areas as well.  Pt reported overall improvement at end of session .   Rehab Potential Fair   PT Frequency 2x / week   PT Duration 6 weeks   PT Treatment/Interventions Ultrasound;ADLs/Self Care Home Management;Iontophoresis '4mg'$ /ml Dexamethasone;Moist Heat;Gait training;Functional mobility training;Therapeutic activities;Therapeutic exercise;Patient/family education;Manual techniques   PT Next Visit Plan continue with  ultrasound to B heel/ distal gastrc tendon and manual to improve mobiltiy .   PT Home Exercise Plan given    Consulted and Agree with Plan of Care Patient      Patient will benefit from skilled therapeutic intervention in order to improve the following deficits and impairments:  Decreased activity tolerance, Decreased balance, Decreased strength, Decreased range of motion, Difficulty walking, Pain  Visit Diagnosis: Pain in right lower leg  Pain in left lower leg  Difficulty in walking, not elsewhere classified  Stiffness of right foot, not elsewhere classified  Stiffness of left foot, not elsewhere classified     Problem List Patient Active Problem List   Diagnosis Date Noted  . Anorectal fissure 10/24/2014  . FH: colon cancer  10/24/2014  . Hx of adenomatous colonic polyps 10/24/2014  . History of pneumonia 06/07/2014  . COPD mixed type ([place]) 06/07/2014  . Pulmonary nodule 06/07/2014  . Abdominal pain, epigastric 05/07/2014  . Hemorrhoids 05/07/2014  . Rectal bleeding   . Rectal pain   . Rectal pain, chronic 12/28/2013  . GERD (gastroesophageal  reflux disease) 09/06/2013  . Chronic cough 09/06/2013    Teena Irani, PTA/CLT [phone]  06/26/2015, 5:53 PM  [facility] [address] [place], [place], [address] Phone: [phone]   Fax:  [phone]  Name: Ellen Bowers MRN: [id number] Date of Birth: 1945/08/13

## 2015-06-27 ENCOUNTER — Encounter: Payer: Self-pay | Admitting: Pulmonary Disease

## 2015-06-27 ENCOUNTER — Ambulatory Visit (HOSPITAL_COMMUNITY): Payer: Medicare Other

## 2015-06-27 ENCOUNTER — Ambulatory Visit (INDEPENDENT_AMBULATORY_CARE_PROVIDER_SITE_OTHER): Payer: Medicare Other | Admitting: Pulmonary Disease

## 2015-06-27 VITALS — BP 144/86 | HR 82 | Temp 98.2°F | Ht 69.0 in | Wt 200.8 lb

## 2015-06-27 DIAGNOSIS — R053 Chronic cough: Secondary | ICD-10-CM

## 2015-06-27 DIAGNOSIS — J449 Chronic obstructive pulmonary disease, unspecified: Secondary | ICD-10-CM | POA: Diagnosis not present

## 2015-06-27 DIAGNOSIS — K219 Gastro-esophageal reflux disease without esophagitis: Secondary | ICD-10-CM

## 2015-06-27 DIAGNOSIS — R05 Cough: Secondary | ICD-10-CM | POA: Diagnosis not present

## 2015-06-27 DIAGNOSIS — R911 Solitary pulmonary nodule: Secondary | ICD-10-CM | POA: Diagnosis not present

## 2015-06-27 NOTE — Patient Instructions (Signed)
Today we updated your med list in our EPIC system...    Continue your current medications the same...  Call for any questions...  Let's plan a follow up visit in 7mo sooner if needed for problems..Marland KitchenMarland Kitchen

## 2015-07-01 ENCOUNTER — Encounter (HOSPITAL_COMMUNITY): Payer: Medicare Other | Admitting: Physical Therapy

## 2015-07-02 ENCOUNTER — Ambulatory Visit (HOSPITAL_COMMUNITY): Payer: Medicare Other

## 2015-07-02 DIAGNOSIS — M79661 Pain in right lower leg: Secondary | ICD-10-CM | POA: Diagnosis not present

## 2015-07-02 DIAGNOSIS — R262 Difficulty in walking, not elsewhere classified: Secondary | ICD-10-CM

## 2015-07-02 DIAGNOSIS — M25674 Stiffness of right foot, not elsewhere classified: Secondary | ICD-10-CM

## 2015-07-02 DIAGNOSIS — M79662 Pain in left lower leg: Secondary | ICD-10-CM

## 2015-07-02 DIAGNOSIS — M25675 Stiffness of left foot, not elsewhere classified: Secondary | ICD-10-CM

## 2015-07-02 NOTE — Therapy (Signed)
[place], [place], [address] Phone: [phone]   Fax:  [phone]  Physical Therapy Treatment  Patient Details  Name: Ellen Bowers MRN: [id number] Date of Birth: 1945-03-12 Referring Provider: Dr. Donette Larry  Encounter Date: 07/02/2015      PT End of Session - 07/02/15 1525    Visit Number 3   Number of Visits 12   Date for PT Re-Evaluation 07/21/15   Authorization Type Medicare   Authorization - Visit Number 3   Authorization - Number of Visits 10   PT Start Time 1519   PT Stop Time 1602   PT Time Calculation (min) 43 min   Activity Tolerance Patient tolerated treatment well   Behavior During Therapy [facility] for tasks assessed/performed      Past Medical History  Diagnosis Date  . Asthma   . COPD (chronic obstructive pulmonary disease) ([place])   . GERD (gastroesophageal reflux disease)   . Arthritis   . Von Willebrand disease ([place]) 1996  . Helicobacter pylori gastritis 2005    treated    Past Surgical History  Procedure Laterality Date  . Tubal ligation  1984  . Neck surgery  1951    cyst removal  . Tonsillectomy  1951  . Cataract extraction      double  . Colonoscopy N/A 05/30/2012    Dr.Mann:Multiple polyps removed from the right colon-see description above; otherwise normal colonoscopy up to the cecum. 5 polyps, multiple tubular adenomas. next tcs 05/2015  . Neck surgery  1998    growth removed  . Esophagogastroduodenoscopy  2012    Dr. Collene Mares: few erosions in duodenal bulb. BRAVO off PPI ("severe reflux")  . Flexible sigmoidoscopy N/A 02/05/2014    Dr. Oneida Alar: 5, 2-3 mm sessile sigmoid colon polyps removed, small internal hemorrhoids, moderate external hemorrhoids, 2 anal fissures present. Polyps were hyperplastic.   [name] Esophagogastroduodenoscopy N/A 06/12/2014    SLF: 1. The mucosa of the esophagus appeared normal 2. Mild non-erosive gastrtitis.     There were no vitals filed for  this visit.      Subjective Assessment - 07/02/15 1522    Subjective Pt stated compliance with HEP daily.  Reports more exercises increase heel pain.  Current pain scale 3-4/10 Lt heel.     Pertinent History COPD, asthma; OA   Patient Stated Goals less pain, to be able to walk longer, stand longer    Currently in Pain? Yes   Pain Score 4    Pain Location Heel   Pain Orientation Left   Pain Descriptors / Indicators Aching   Pain Type Chronic pain   Pain Onset More than a month ago   Pain Frequency Constant   Aggravating Factors  standing, walking   Pain Relieving Factors nothing   Effect of Pain on Daily Activities cannot walk long distance, decreased ADLs                [facility] Adult PT Treatment/Exercise - 07/02/15 0001    Modalities   Modalities Ultrasound   Ultrasound   Ultrasound Location Bil heel    Ultrasound Parameters 1.2 MHz continous   Ultrasound Goals Pain   Manual Therapy   Manual Therapy Soft tissue mobilization   Soft tissue mobilization Bilateral gastroc/soleus complex    Ankle Exercises: Stretches   Plantar Fascia Stretch 2 reps;30 seconds   Gastroc Stretch 30 seconds;3 reps  long sit st with rope; supine x 1, standing against wall  PT Short Term Goals - 06/26/15 1752    PT SHORT TERM GOAL #1   Title Pt to be independent in HEP to allow pt to be able to stand for 15 mintues without increased pain in bilateral heels  to be able to complete dishes    Time 3   Period Weeks   Status On-going   PT SHORT TERM GOAL #2   Title Pt to be able to walk for 15 minutes without increased pain in bilateral heels to be able to complete short shopping trips in comfort   Time 3   Period Weeks   Status On-going   PT SHORT TERM GOAL #3   Title Pt pain to be no greater than a 5/10 in order to have no difficulty getting to sleep.    Time 3   Period Weeks   Status On-going           PT Long Term Goals - 06/26/15 1752    PT LONG TERM GOAL #1    Title Pt to be in advance HEP in order to decrease  bilateral heel pain to no greater than a 2/10   Time 6   Period Weeks   Status On-going   PT LONG TERM GOAL #2   Title Pt to be able to stand for 30 minutes withou increased bilateral heel pain in order to be able to cook a meal.   Time 6   Period Weeks   Status On-going   PT LONG TERM GOAL #3   Title Pt to be able to walk for 30 minutes without increased heel pain to return to walking for exercise regieme for healthier lifestyle.    Time 6   Period Weeks   Status On-going   PT LONG TERM GOAL #4   Title Pt to be able to complete her housework without having to rest due to increase heel pain    Time 6   Period Weeks   Status On-going               Plan - 07/02/15 1615    Clinical Impression Statement Session focus on pain control and improving foot mobilty with manual technqiues.  Began session with continuous [place] to warm musculature prior manual soft tissue mobilization to reudce overall gastroc tightness for pain control.  Reviewed and instructed gastroc stretches to add to HEP as pt stated she did not feel a stretch with the standard gastroc stretch against wall.  EOS pt reports Bil feet pain free.     Rehab Potential Fair   PT Frequency 2x / week   PT Duration 6 weeks   PT Treatment/Interventions Ultrasound;ADLs/Self Care Home Management;Iontophoresis '4mg'$ /ml Dexamethasone;Moist Heat;Gait training;Functional mobility training;Therapeutic activities;Therapeutic exercise;Patient/family education;Manual techniques   PT Next Visit Plan continue with  ultrasound to B heel/ distal gastrc tendon and manual to improve mobiltiy .      Patient will benefit from skilled therapeutic intervention in order to improve the following deficits and impairments:  Decreased activity tolerance, Decreased balance, Decreased strength, Decreased range of motion, Difficulty walking, Pain  Visit Diagnosis: Pain in right lower leg  Pain in left  lower leg  Difficulty in walking, not elsewhere classified  Stiffness of right foot, not elsewhere classified  Stiffness of left foot, not elsewhere classified     Problem List Patient Active Problem List   Diagnosis Date Noted  . Anorectal fissure 10/24/2014  . FH: colon cancer 10/24/2014  . Hx of adenomatous colonic polyps  10/24/2014  . History of pneumonia 06/07/2014  . COPD mixed type ([place]) 06/07/2014  . Pulmonary nodule 06/07/2014  . Abdominal pain, epigastric 05/07/2014  . Hemorrhoids 05/07/2014  . Rectal bleeding   . Rectal pain   . Rectal pain, chronic 12/28/2013  . GERD (gastroesophageal reflux disease) 09/06/2013  . Chronic cough 09/06/2013   Ihor Austin, LPTA; CBIS [phone]  Aldona Lento 07/02/2015, 5:58 PM  [facility] [address] [place], [place], [address] Phone: [phone]   Fax:  [phone]  Name: Ellen Bowers MRN: [id number] Date of Birth: Oct 16, 1945

## 2015-07-03 ENCOUNTER — Encounter (HOSPITAL_COMMUNITY): Payer: Medicare Other | Admitting: Physical Therapy

## 2015-07-04 ENCOUNTER — Ambulatory Visit (HOSPITAL_COMMUNITY): Payer: Medicare Other

## 2015-07-04 DIAGNOSIS — M25675 Stiffness of left foot, not elsewhere classified: Secondary | ICD-10-CM

## 2015-07-04 DIAGNOSIS — M79662 Pain in left lower leg: Secondary | ICD-10-CM

## 2015-07-04 DIAGNOSIS — M79661 Pain in right lower leg: Secondary | ICD-10-CM | POA: Diagnosis not present

## 2015-07-04 DIAGNOSIS — R262 Difficulty in walking, not elsewhere classified: Secondary | ICD-10-CM

## 2015-07-04 DIAGNOSIS — M25674 Stiffness of right foot, not elsewhere classified: Secondary | ICD-10-CM

## 2015-07-04 NOTE — Therapy (Signed)
[place], [place], [address] Phone: [phone]   Fax:  [phone]  Physical Therapy Treatment  Patient Details  Name: Ellen Bowers MRN: [id number] Date of Birth: Jul 22, 1945 Referring Provider: Dr. Donette Larry  Encounter Date: 07/04/2015      PT End of Session - 07/04/15 1617    Visit Number 4   Number of Visits 12   Date for PT Re-Evaluation 07/21/15   Authorization Type Medicare   Authorization - Visit Number 4   Authorization - Number of Visits 10   PT Start Time 6144   PT Stop Time 1649   PT Time Calculation (min) 43 min   Activity Tolerance Patient tolerated treatment well   Behavior During Therapy [facility] for tasks assessed/performed      Past Medical History  Diagnosis Date  . Asthma   . COPD (chronic obstructive pulmonary disease) ([place])   . GERD (gastroesophageal reflux disease)   . Arthritis   . Von Willebrand disease ([place]) 1996  . Helicobacter pylori gastritis 2005    treated    Past Surgical History  Procedure Laterality Date  . Tubal ligation  1984  . Neck surgery  1951    cyst removal  . Tonsillectomy  1951  . Cataract extraction      double  . Colonoscopy N/A 05/30/2012    Dr.Mann:Multiple polyps removed from the right colon-see description above; otherwise normal colonoscopy up to the cecum. 5 polyps, multiple tubular adenomas. next tcs 05/2015  . Neck surgery  1998    growth removed  . Esophagogastroduodenoscopy  2012    Dr. Collene Mares: few erosions in duodenal bulb. BRAVO off PPI ("severe reflux")  . Flexible sigmoidoscopy N/A 02/05/2014    Dr. Oneida Alar: 5, 2-3 mm sessile sigmoid colon polyps removed, small internal hemorrhoids, moderate external hemorrhoids, 2 anal fissures present. Polyps were hyperplastic.   [name] Esophagogastroduodenoscopy N/A 06/12/2014    SLF: 1. The mucosa of the esophagus appeared normal 2. Mild non-erosive gastrtitis.     There were no vitals filed for  this visit.      Subjective Assessment - 07/04/15 1607    Subjective Pt stated she was pain free upon exit last session with increased pain following less than an hour with gait.  Current pain scale 2/10 Lt heel.  Reports increased pain following plantar fascia stretch, wondering if correct form.     Pertinent History COPD, asthma; OA   Patient Stated Goals less pain, to be able to walk longer, stand longer    Currently in Pain? Yes   Pain Score 2   pain scale 1/10 Rt heel, 2/10 Lt heel   Pain Location Heel   Pain Orientation Left   Pain Descriptors / Indicators Sore   Pain Type Chronic pain   Pain Onset More than a month ago   Pain Frequency Constant   Aggravating Factors  standing, walking   Pain Relieving Factors nothing   Effect of Pain on Daily Activities cannot walk long distance, decreased ADLs           [facility] Adult PT Treatment/Exercise - 07/04/15 0001    Ultrasound   Ultrasound Location Bil heel   Ultrasound Parameters 1.2 3 MHz continuous   Ultrasound Goals Pain   Manual Therapy   Manual Therapy Soft tissue mobilization   Manual therapy comments Manual complete separate rest of treatment   Soft tissue mobilization Bilateral gastroc/soleus complex    Ankle Exercises: Stretches  Plantar Fascia Stretch 2 reps;30 seconds   Gastroc Stretch 30 seconds;3 reps  supine position with rope 3x; standard against wall 2x 30"   Ankle Exercises: Standing   SLS Lt 16". Rt 14" max of 3             PT Short Term Goals - 06/26/15 1752    PT SHORT TERM GOAL #1   Title Pt to be independent in HEP to allow pt to be able to stand for 15 mintues without increased pain in bilateral heels  to be able to complete dishes    Time 3   Period Weeks   Status On-going   PT SHORT TERM GOAL #2   Title Pt to be able to walk for 15 minutes without increased pain in bilateral heels to be able to complete short shopping trips in comfort   Time 3   Period Weeks   Status On-going   PT  SHORT TERM GOAL #3   Title Pt pain to be no greater than a 5/10 in order to have no difficulty getting to sleep.    Time 3   Period Weeks   Status On-going           PT Long Term Goals - 06/26/15 1752    PT LONG TERM GOAL #1   Title Pt to be in advance HEP in order to decrease  bilateral heel pain to no greater than a 2/10   Time 6   Period Weeks   Status On-going   PT LONG TERM GOAL #2   Title Pt to be able to stand for 30 minutes withou increased bilateral heel pain in order to be able to cook a meal.   Time 6   Period Weeks   Status On-going   PT LONG TERM GOAL #3   Title Pt to be able to walk for 30 minutes without increased heel pain to return to walking for exercise regieme for healthier lifestyle.    Time 6   Period Weeks   Status On-going   PT LONG TERM GOAL #4   Title Pt to be able to complete her housework without having to rest due to increase heel pain    Time 6   Period Weeks   Status On-going               Plan - 07/04/15 1652    Clinical Impression Statement Session focus on pain control and improving foot mobility wtih manual and reviewed stretches.  Pt continues to state she does not feel stretch with standing gastroc stretch against wall, reviewed form and instructed alternate stretch that she reports she could feel better.  Continued with [place] followed by manual techniques to reduce tightness gastroc/soleus complex, minimal fascial restrictions noted on plantar fascia areas.  Ended session reviewing strethces and SLS, pt reports pain reduced in general though does have some ache in ankles/     Rehab Potential Fair   PT Frequency 2x / week   PT Duration 6 weeks   PT Treatment/Interventions Ultrasound;ADLs/Self Care Home Management;Iontophoresis '4mg'$ /ml Dexamethasone;Moist Heat;Gait training;Functional mobility training;Therapeutic activities;Therapeutic exercise;Patient/family education;Manual techniques   PT Next Visit Plan continue with  ultrasound to  B heel/ distal gastrc tendon and manual to improve mobiltiy .      Patient will benefit from skilled therapeutic intervention in order to improve the following deficits and impairments:  Decreased activity tolerance, Decreased balance, Decreased strength, Decreased range of motion, Difficulty walking, Pain  Visit Diagnosis:  Pain in right lower leg  Pain in left lower leg  Difficulty in walking, not elsewhere classified  Stiffness of right foot, not elsewhere classified  Stiffness of left foot, not elsewhere classified     Problem List Patient Active Problem List   Diagnosis Date Noted  . Anorectal fissure 10/24/2014  . FH: colon cancer 10/24/2014  . Hx of adenomatous colonic polyps 10/24/2014  . History of pneumonia 06/07/2014  . COPD mixed type ([place]) 06/07/2014  . Pulmonary nodule 06/07/2014  . Abdominal pain, epigastric 05/07/2014  . Hemorrhoids 05/07/2014  . Rectal bleeding   . Rectal pain   . Rectal pain, chronic 12/28/2013  . GERD (gastroesophageal reflux disease) 09/06/2013  . Chronic cough 09/06/2013   Ihor Austin, LPTA; CBIS [phone]  Aldona Lento 07/04/2015, 5:09 PM  [place], [place], [address] Phone: [phone]   Fax:  [phone]  Name: LILIAHNA CUDD MRN: [id number] Date of Birth: 11/05/1945

## 2015-07-08 ENCOUNTER — Encounter (HOSPITAL_COMMUNITY): Payer: Medicare Other | Admitting: Physical Therapy

## 2015-07-09 ENCOUNTER — Ambulatory Visit (HOSPITAL_COMMUNITY): Payer: Medicare Other

## 2015-07-09 DIAGNOSIS — M25674 Stiffness of right foot, not elsewhere classified: Secondary | ICD-10-CM

## 2015-07-09 DIAGNOSIS — M79661 Pain in right lower leg: Secondary | ICD-10-CM | POA: Diagnosis not present

## 2015-07-09 DIAGNOSIS — M79662 Pain in left lower leg: Secondary | ICD-10-CM

## 2015-07-09 DIAGNOSIS — R262 Difficulty in walking, not elsewhere classified: Secondary | ICD-10-CM

## 2015-07-09 DIAGNOSIS — M25675 Stiffness of left foot, not elsewhere classified: Secondary | ICD-10-CM

## 2015-07-09 NOTE — Therapy (Signed)
[place], [place], [address] Phone: [phone]   Fax:  [phone]  Physical Therapy Treatment  Patient Details  Name: Ellen Bowers MRN: [id number] Date of Birth: June 27, 1945 Referring Provider: Dr. Donette Larry  Encounter Date: 07/09/2015      PT End of Session - 07/09/15 1425    Visit Number 5   Number of Visits 12   Date for PT Re-Evaluation 07/21/15   Authorization Type Medicare   Authorization - Visit Number 5   Authorization - Number of Visits 10   PT Start Time 8032   PT Stop Time 1435   PT Time Calculation (min) 46 min   Equipment Utilized During Treatment Gait belt   Activity Tolerance Patient tolerated treatment well   Behavior During Therapy [facility] for tasks assessed/performed      Past Medical History  Diagnosis Date  . Asthma   . COPD (chronic obstructive pulmonary disease) ([place])   . GERD (gastroesophageal reflux disease)   . Arthritis   . Von Willebrand disease ([place]) 1996  . Helicobacter pylori gastritis 2005    treated    Past Surgical History  Procedure Laterality Date  . Tubal ligation  1984  . Neck surgery  1951    cyst removal  . Tonsillectomy  1951  . Cataract extraction      double  . Colonoscopy N/A 05/30/2012    Dr.Mann:Multiple polyps removed from the right colon-see description above; otherwise normal colonoscopy up to the cecum. 5 polyps, multiple tubular adenomas. next tcs 05/2015  . Neck surgery  1998    growth removed  . Esophagogastroduodenoscopy  2012    Dr. Collene Mares: few erosions in duodenal bulb. BRAVO off PPI ("severe reflux")  . Flexible sigmoidoscopy N/A 02/05/2014    Dr. Oneida Alar: 5, 2-3 mm sessile sigmoid colon polyps removed, small internal hemorrhoids, moderate external hemorrhoids, 2 anal fissures present. Polyps were hyperplastic.   [name] Esophagogastroduodenoscopy N/A 06/12/2014    SLF: 1. The mucosa of the esophagus appeared normal 2. Mild non-erosive  gastrtitis.     There were no vitals filed for this visit.      Subjective Assessment - 07/09/15 1355    Subjective Pt reports she is doing ok, but may have over-don it gardening last week, with worse back pain which limited her HEP performance.    Pertinent History COPD, asthma; OA   Currently in Pain? Yes   Pain Score 3    Pain Location Heel   Pain Orientation Left             [facility] Adult PT Treatment/Exercise - 07/09/15 0001    Ambulation/Gait   Ambulation/Gait Yes   Ambulation Distance (Feet) 30 Feet  103fx8 with VC for modification as tolerated.    Assistive device None   Gait Comments VC for narrow stance, + toe-in, +heel to toe.    Ankle Exercises: Stretches   Gastroc Stretch 30 seconds;3 reps  standing, wall-push, HEAVY emphasis on toe-in   Ankle Exercises: Standing   Heel Raises 15 reps  2x15; bilat simultaneous (narrow stance + toe-in)   Toe Raise 15 reps  2x15; bilat simultaneous (narrow stance + toe-in)   Ankle Exercises: Supine   Other Supine Ankle Exercises Supine Clam (BlueTB) + bridge  yellow ball between feet; 2x10   Other Supine Ankle Exercises LLE SLR c mild Toe-inn +DF  2x10  PT Short Term Goals - 06/26/15 1752    PT SHORT TERM GOAL #1   Title Pt to be independent in HEP to allow pt to be able to stand for 15 mintues without increased pain in bilateral heels  to be able to complete dishes    Time 3   Period Weeks   Status On-going   PT SHORT TERM GOAL #2   Title Pt to be able to walk for 15 minutes without increased pain in bilateral heels to be able to complete short shopping trips in comfort   Time 3   Period Weeks   Status On-going   PT SHORT TERM GOAL #3   Title Pt pain to be no greater than a 5/10 in order to have no difficulty getting to sleep.    Time 3   Period Weeks   Status On-going           PT Long Term Goals - 06/26/15 1752    PT LONG TERM GOAL #1   Title Pt to be in advance HEP in order to  decrease  bilateral heel pain to no greater than a 2/10   Time 6   Period Weeks   Status On-going   PT LONG TERM GOAL #2   Title Pt to be able to stand for 30 minutes withou increased bilateral heel pain in order to be able to cook a meal.   Time 6   Period Weeks   Status On-going   PT LONG TERM GOAL #3   Title Pt to be able to walk for 30 minutes without increased heel pain to return to walking for exercise regieme for healthier lifestyle.    Time 6   Period Weeks   Status On-going   PT LONG TERM GOAL #4   Title Pt to be able to complete her housework without having to rest due to increase heel pain    Time 6   Period Weeks   Status On-going               Plan - 07/09/15 1426    Clinical Impression Statement Pt making progress toward all goals today. Aggressive strengthening toward corecting LLE alignment during gait, with some gait training as well. Pt is a fast learner and responds quickly to visual and verbal cues for correction of gait in situ.     Rehab Potential Fair   PT Frequency 2x / week   PT Duration 6 weeks   PT Treatment/Interventions Ultrasound;ADLs/Self Care Home Management;Iontophoresis '4mg'$ /ml Dexamethasone;Moist Heat;Gait training;Functional mobility training;Therapeutic activities;Therapeutic exercise;Patient/family education;Manual techniques   PT Next Visit Plan More gait training with cues for toe in, narrow stance, normal;ized heel-to-toe pronation, etc.    PT Home Exercise Plan updated SLS to 10x10s alteranting bilat.    Consulted and Agree with Plan of Care Patient      Patient will benefit from skilled therapeutic intervention in order to improve the following deficits and impairments:  Decreased activity tolerance, Decreased balance, Decreased strength, Decreased range of motion, Difficulty walking, Pain  Visit Diagnosis: Pain in right lower leg  Pain in left lower leg  Difficulty in walking, not elsewhere classified  Stiffness of right  foot, not elsewhere classified  Stiffness of left foot, not elsewhere classified     Problem List Patient Active Problem List   Diagnosis Date Noted  . Anorectal fissure 10/24/2014  . FH: colon cancer 10/24/2014  . Hx of adenomatous colonic polyps 10/24/2014  . History of pneumonia  06/07/2014  . COPD mixed type ([place]) 06/07/2014  . Pulmonary nodule 06/07/2014  . Abdominal pain, epigastric 05/07/2014  . Hemorrhoids 05/07/2014  . Rectal bleeding   . Rectal pain   . Rectal pain, chronic 12/28/2013  . GERD (gastroesophageal reflux disease) 09/06/2013  . Chronic cough 09/06/2013   4:26 PM, 07/09/2015 Etta Grandchild, PT, DPT Physical Therapist at [place] [phone] (office)     [place] [address] [place], [place], [address] Phone: [phone]   Fax:  [phone]  Name: LEISEL PINETTE MRN: [id number] Date of Birth: [birth year]

## 2015-07-10 ENCOUNTER — Encounter (HOSPITAL_COMMUNITY): Payer: Medicare Other | Admitting: Physical Therapy

## 2015-07-11 ENCOUNTER — Ambulatory Visit (HOSPITAL_COMMUNITY): Payer: Medicare Other | Admitting: Physical Therapy

## 2015-07-11 DIAGNOSIS — M25674 Stiffness of right foot, not elsewhere classified: Secondary | ICD-10-CM

## 2015-07-11 DIAGNOSIS — M79661 Pain in right lower leg: Secondary | ICD-10-CM

## 2015-07-11 DIAGNOSIS — R262 Difficulty in walking, not elsewhere classified: Secondary | ICD-10-CM

## 2015-07-11 DIAGNOSIS — M25675 Stiffness of left foot, not elsewhere classified: Secondary | ICD-10-CM

## 2015-07-11 DIAGNOSIS — M79662 Pain in left lower leg: Secondary | ICD-10-CM

## 2015-07-11 NOTE — Therapy (Signed)
[place], [place], [address] Phone: [phone]   Fax:  [phone]  Physical Therapy Treatment  Patient Details  Name: Ellen Bowers MRN: [id number] Date of Birth: 12/30/1945 Referring Provider: Dr. Donette Larry  Encounter Date: 07/11/2015      PT End of Session - 07/11/15 1210    Visit Number 6   Number of Visits 12   Date for PT Re-Evaluation 07/21/15   Authorization Type Medicare   Authorization - Visit Number 6   Authorization - Number of Visits 10   PT Start Time 9470   PT Stop Time 1116   PT Time Calculation (min) 41 min   Equipment Utilized During Treatment Gait belt   Activity Tolerance Patient tolerated treatment well   Behavior During Therapy [facility] for tasks assessed/performed      Past Medical History  Diagnosis Date  . Asthma   . COPD (chronic obstructive pulmonary disease) ([place])   . GERD (gastroesophageal reflux disease)   . Arthritis   . Von Willebrand disease ([place]) 1996  . Helicobacter pylori gastritis 2005    treated    Past Surgical History  Procedure Laterality Date  . Tubal ligation  1984  . Neck surgery  1951    cyst removal  . Tonsillectomy  1951  . Cataract extraction      double  . Colonoscopy N/A 05/30/2012    Dr.Mann:Multiple polyps removed from the right colon-see description above; otherwise normal colonoscopy up to the cecum. 5 polyps, multiple tubular adenomas. next tcs 05/2015  . Neck surgery  1998    growth removed  . Esophagogastroduodenoscopy  2012    Dr. Collene Mares: few erosions in duodenal bulb. BRAVO off PPI ("severe reflux")  . Flexible sigmoidoscopy N/A 02/05/2014    Dr. Oneida Alar: 5, 2-3 mm sessile sigmoid colon polyps removed, small internal hemorrhoids, moderate external hemorrhoids, 2 anal fissures present. Polyps were hyperplastic.   [name] Esophagogastroduodenoscopy N/A 06/12/2014    SLF: 1. The mucosa of the esophagus appeared normal 2. Mild non-erosive  gastrtitis.     There were no vitals filed for this visit.      Subjective Assessment - 07/11/15 1038    Subjective Pt states that she is doing better.   Pertinent History COPD, asthma; OA   Currently in Pain? Yes   Pain Score 2    Pain Location Ankle   Pain Orientation Right   Pain Descriptors / Indicators Aching   Pain Onset More than a month ago   Pain Frequency Intermittent   Aggravating Factors  being on feet for prolong period of time    Pain Relieving Factors stretching    Effect of Pain on Daily Activities increases                         [facility] Adult PT Treatment/Exercise - 07/11/15 0001    Ultrasound   Ultrasound Goals Pain   Manual Therapy   Manual Therapy Soft tissue mobilization   Manual therapy comments Manual complete separate rest of treatment   Soft tissue mobilization Bilateral gastroc/soleus complex    Ankle Exercises: Sidelying   Ankle Eversion Both;10 reps;Weights  4#    Ankle Exercises: Stretches   Gastroc Stretch 30 seconds;3 reps  supine position with rope 3x; standard against wall 2x 30"   Ankle Exercises: Standing   BAPS Standing;Level 2;10 reps   SLS Lt 16". Rt 14" max of 3  Heel Raises 15 reps   Toe Raise 15 reps   Other Standing Ankle Exercises SLS x 3 bilaterally                   PT Short Term Goals - 07/11/15 1213    PT SHORT TERM GOAL #1   Title Pt to be independent in HEP to allow pt to be able to stand for 15 mintues without increased pain in bilateral heels  to be able to complete dishes    Time 3   Period Weeks   Status On-going   PT SHORT TERM GOAL #2   Title Pt to be able to walk for 15 minutes without increased pain in bilateral heels to be able to complete short shopping trips in comfort   Time 3   Period Weeks   Status On-going   PT SHORT TERM GOAL #3   Title Pt pain to be no greater than a 5/10 in order to have no difficulty getting to sleep.    Time 3   Period Weeks   Status On-going            PT Long Term Goals - 07/11/15 1213    PT LONG TERM GOAL #1   Title Pt to be in advance HEP in order to decrease  bilateral heel pain to no greater than a 2/10   Time 6   Period Weeks   Status On-going   PT LONG TERM GOAL #2   Title Pt to be able to stand for 30 minutes withou increased bilateral heel pain in order to be able to cook a meal.   Time 6   Period Weeks   Status On-going   PT LONG TERM GOAL #3   Title Pt to be able to walk for 30 minutes without increased heel pain to return to walking for exercise regieme for healthier lifestyle.    Time 6   Period Weeks   Status On-going   PT LONG TERM GOAL #4   Title Pt to be able to complete her housework without having to rest due to increase heel pain    Time 6   Period Weeks   Status On-going               Plan - 07/11/15 1210    Clinical Impression Statement Therapist added baps and sidelying eversion with weight to progress strength as well as coordination.  Pt needed significant faciliation to complete Baps activity using LE only and not her whole body.  Only mild tightness detected in manual soft tissue technique but pt is quite sensitive in medial aspect of gastrocnemius mm bilaterally.     Rehab Potential Fair   PT Frequency 2x / week   PT Duration 6 weeks   PT Treatment/Interventions Ultrasound;ADLs/Self Care Home Management;Iontophoresis '4mg'$ /ml Dexamethasone;Moist Heat;Gait training;Functional mobility training;Therapeutic activities;Therapeutic exercise;Patient/family education;Manual techniques   PT Next Visit Plan Gt heel toe instruction and progress with ankle strengthening    PT Home Exercise Plan updated SLS to 10x10s alteranting bilat.    Consulted and Agree with Plan of Care Patient      Patient will benefit from skilled therapeutic intervention in order to improve the following deficits and impairments:  Decreased activity tolerance, Decreased balance, Decreased strength, Decreased range of  motion, Difficulty walking, Pain  Visit Diagnosis: Pain in right lower leg  Pain in left lower leg  Difficulty in walking, not elsewhere classified  Stiffness of right foot, not elsewhere classified  Stiffness of left foot, not elsewhere classified     Problem List Patient Active Problem List   Diagnosis Date Noted  . Anorectal fissure 10/24/2014  . FH: colon cancer 10/24/2014  . Hx of adenomatous colonic polyps 10/24/2014  . History of pneumonia 06/07/2014  . COPD mixed type ([place]) 06/07/2014  . Pulmonary nodule 06/07/2014  . Abdominal pain, epigastric 05/07/2014  . Hemorrhoids 05/07/2014  . Rectal bleeding   . Rectal pain   . Rectal pain, chronic 12/28/2013  . GERD (gastroesophageal reflux disease) 09/06/2013  . Chronic cough 09/06/2013   Rayetta Humphrey, PT CLT [phone] 07/11/2015, 12:15 PM  [facility] [address] [place], [place], [address] Phone: [phone]   Fax:  [phone]  Name: Ellen Bowers MRN: [id number] Date of Birth: 1945/12/04

## 2015-07-18 ENCOUNTER — Ambulatory Visit (HOSPITAL_COMMUNITY): Payer: Medicare Other | Attending: Orthopedic Surgery | Admitting: Physical Therapy

## 2015-07-18 DIAGNOSIS — M79661 Pain in right lower leg: Secondary | ICD-10-CM

## 2015-07-18 DIAGNOSIS — M25674 Stiffness of right foot, not elsewhere classified: Secondary | ICD-10-CM | POA: Diagnosis present

## 2015-07-18 DIAGNOSIS — M79662 Pain in left lower leg: Secondary | ICD-10-CM | POA: Insufficient documentation

## 2015-07-18 DIAGNOSIS — R1314 Dysphagia, pharyngoesophageal phase: Secondary | ICD-10-CM | POA: Insufficient documentation

## 2015-07-18 DIAGNOSIS — M25675 Stiffness of left foot, not elsewhere classified: Secondary | ICD-10-CM | POA: Diagnosis present

## 2015-07-18 DIAGNOSIS — R262 Difficulty in walking, not elsewhere classified: Secondary | ICD-10-CM | POA: Insufficient documentation

## 2015-07-18 NOTE — Therapy (Signed)
[place], [place], [address] Phone: [phone]   Fax:  [phone]  Physical Therapy Treatment  Patient Details  Name: Ellen Bowers MRN: [id number] Date of Birth: 06-03-1945 Referring Provider: Dr. Donette Larry  Encounter Date: 07/18/2015      PT End of Session - 07/18/15 1614    Visit Number 7   Number of Visits 12   Date for PT Re-Evaluation 07/21/15   Authorization Type Medicare   Authorization - Visit Number 7   Authorization - Number of Visits 10   PT Start Time 4481   PT Stop Time 1611   PT Time Calculation (min) 46 min   Activity Tolerance Patient tolerated treatment well   Behavior During Therapy [facility] for tasks assessed/performed      Past Medical History  Diagnosis Date  . Asthma   . COPD (chronic obstructive pulmonary disease) ([place])   . GERD (gastroesophageal reflux disease)   . Arthritis   . Von Willebrand disease ([place]) 1996  . Helicobacter pylori gastritis 2005    treated    Past Surgical History  Procedure Laterality Date  . Tubal ligation  1984  . Neck surgery  1951    cyst removal  . Tonsillectomy  1951  . Cataract extraction      double  . Colonoscopy N/A 05/30/2012    Dr.Mann:Multiple polyps removed from the right colon-see description above; otherwise normal colonoscopy up to the cecum. 5 polyps, multiple tubular adenomas. next tcs 05/2015  . Neck surgery  1998    growth removed  . Esophagogastroduodenoscopy  2012    Dr. Collene Mares: few erosions in duodenal bulb. BRAVO off PPI ("severe reflux")  . Flexible sigmoidoscopy N/A 02/05/2014    Dr. Oneida Alar: 5, 2-3 mm sessile sigmoid colon polyps removed, small internal hemorrhoids, moderate external hemorrhoids, 2 anal fissures present. Polyps were hyperplastic.   [name] Esophagogastroduodenoscopy N/A 06/12/2014    SLF: 1. The mucosa of the esophagus appeared normal 2. Mild non-erosive gastrtitis.     There were no vitals filed for  this visit.      Subjective Assessment - 07/18/15 1532    Subjective Pt states that both of her arches are bothering her.   Pertinent History COPD, asthma; OA   Currently in Pain? Yes   Pain Score 7    Pain Location Foot   Pain Orientation Right;Left   Pain Descriptors / Indicators Aching;Throbbing   Pain Type Acute pain   Pain Onset More than a month ago                         [facility] Adult PT Treatment/Exercise - 07/18/15 0001    Ultrasound   Ultrasound Goals Pain   Manual Therapy   Manual Therapy Soft tissue mobilization   Manual therapy comments Manual complete separate rest of treatment   Soft tissue mobilization Bilateral gastroc/soleus complex    Ankle Exercises: Stretches   Plantar Fascia Stretch 60 seconds   Gastroc Stretch --     Ankle Exercises: Standing   BAPS Standing;Level 2;10 reps B   SLS Lt 16". Rt 14" max of 3   Heel Raises 15 reps   Toe Raise 15 reps   Other Standing Ankle Exercises SLS x 3 bilaterally    Ankle Exercises: Sidelying   Ankle Eversion Both;10 reps;Weights  4#    Ankle Exercises: Machines for Strengthening   Cybex Leg Press 3 PLx 10 with  slow return                   PT Short Term Goals - 07/18/15 1617    PT SHORT TERM GOAL #1   Title Pt to be independent in HEP to allow pt to be able to stand for 15 mintues without increased pain in bilateral heels  to be able to complete dishes    Time 3   Period Weeks   Status On-going   PT SHORT TERM GOAL #2   Title Pt to be able to walk for 15 minutes without increased pain in bilateral heels to be able to complete short shopping trips in comfort   Time 3   Period Weeks   Status On-going   PT SHORT TERM GOAL #3   Title Pt pain to be no greater than a 5/10 in order to have no difficulty getting to sleep.    Time 3   Period Weeks   Status On-going           PT Long Term Goals - 07/18/15 1617    PT LONG TERM GOAL #1   Title Pt to be in advance HEP in order to  decrease  bilateral heel pain to no greater than a 2/10   Time 6   Period Weeks   Status On-going   PT LONG TERM GOAL #2   Title Pt to be able to stand for 30 minutes withou increased bilateral heel pain in order to be able to cook a meal.   Time 6   Period Weeks   Status On-going   PT LONG TERM GOAL #3   Title Pt to be able to walk for 30 minutes without increased heel pain to return to walking for exercise regieme for healthier lifestyle.    Time 6   Period Weeks   Status On-going   PT LONG TERM GOAL #4   Title Pt to be able to complete her housework without having to rest due to increase heel pain    Time 6   Period Weeks   Status On-going               Plan - 07/18/15 1615    Clinical Impression Statement Manual concentrated on plantar aspect of foot due to pt complaint of arch pain with noted tightness of  plantar fascia; Rt greater than left.  Pt demonstarted improved technique with Baps exercises.  Added cybex foot press to improve eccentric strength.    Rehab Potential Fair   PT Frequency 2x / week   PT Duration 6 weeks   PT Treatment/Interventions Ultrasound;ADLs/Self Care Home Management;Iontophoresis '4mg'$ /ml Dexamethasone;Moist Heat;Gait training;Functional mobility training;Therapeutic activities;Therapeutic exercise;Patient/family education;Manual techniques   PT Next Visit Plan piriformis stretch B,( especially for Rt as pt ambulates with leg ER)   PT Home Exercise Plan .    Consulted and Agree with Plan of Care Patient      Patient will benefit from skilled therapeutic intervention in order to improve the following deficits and impairments:  Decreased activity tolerance, Decreased balance, Decreased strength, Decreased range of motion, Difficulty walking, Pain  Visit Diagnosis: Pain in right lower leg  Pain in left lower leg  Difficulty in walking, not elsewhere classified     Problem List Patient Active Problem List   Diagnosis Date Noted  .  Anorectal fissure 10/24/2014  . FH: colon cancer 10/24/2014  . Hx of adenomatous colonic polyps 10/24/2014  . History of pneumonia 06/07/2014  .  COPD mixed type ([place]) 06/07/2014  . Pulmonary nodule 06/07/2014  . Abdominal pain, epigastric 05/07/2014  . Hemorrhoids 05/07/2014  . Rectal bleeding   . Rectal pain   . Rectal pain, chronic 12/28/2013  . GERD (gastroesophageal reflux disease) 09/06/2013  . Chronic cough 09/06/2013   Rayetta Humphrey, PT CLT [phone] 07/18/2015, 4:18 PM  [facility] [address] [place], [place], [address] Phone: [phone]   Fax:  [phone]  Name: Ellen Bowers MRN: [id number] Date of Birth: 07-21-1945

## 2015-07-23 ENCOUNTER — Ambulatory Visit (HOSPITAL_COMMUNITY): Payer: Medicare Other

## 2015-07-23 DIAGNOSIS — M25675 Stiffness of left foot, not elsewhere classified: Secondary | ICD-10-CM

## 2015-07-23 DIAGNOSIS — M25674 Stiffness of right foot, not elsewhere classified: Secondary | ICD-10-CM

## 2015-07-23 DIAGNOSIS — M79661 Pain in right lower leg: Secondary | ICD-10-CM

## 2015-07-23 DIAGNOSIS — R1314 Dysphagia, pharyngoesophageal phase: Secondary | ICD-10-CM

## 2015-07-23 DIAGNOSIS — M79662 Pain in left lower leg: Secondary | ICD-10-CM

## 2015-07-23 DIAGNOSIS — R262 Difficulty in walking, not elsewhere classified: Secondary | ICD-10-CM

## 2015-07-23 NOTE — Therapy (Signed)
[facility] [address] [place], [place], [address] Phone: [phone]   Fax:  [phone]  Physical Therapy Treatment  Patient Details  Name: Ellen Bowers MRN: [id number] Date of Birth: 06/07/1945 Referring Provider: Dr. Donette Larry  Encounter Date: 07/23/2015    Past Medical History  Diagnosis Date  . Asthma   . COPD (chronic obstructive pulmonary disease) ([place])   . GERD (gastroesophageal reflux disease)   . Arthritis   . Von Willebrand disease ([place]) 1996  . Helicobacter pylori gastritis 2005    treated    Past Surgical History  Procedure Laterality Date  . Tubal ligation  1984  . Neck surgery  1951    cyst removal  . Tonsillectomy  1951  . Cataract extraction      double  . Colonoscopy N/A 05/30/2012    Dr.Mann:Multiple polyps removed from the right colon-see description above; otherwise normal colonoscopy up to the cecum. 5 polyps, multiple tubular adenomas. next tcs 05/2015  . Neck surgery  1998    growth removed  . Esophagogastroduodenoscopy  2012    Dr. Collene Mares: few erosions in duodenal bulb. BRAVO off PPI ("severe reflux")  . Flexible sigmoidoscopy N/A 02/05/2014    Dr. Oneida Alar: 5, 2-3 mm sessile sigmoid colon polyps removed, small internal hemorrhoids, moderate external hemorrhoids, 2 anal fissures present. Polyps were hyperplastic.   [name] Esophagogastroduodenoscopy N/A 06/12/2014    SLF: 1. The mucosa of the esophagus appeared normal 2. Mild non-erosive gastrtitis.     There were no vitals filed for this visit.      Subjective Assessment - 07/23/15 1524    Subjective Pt reports that she is having some difficulty with her knees today, worse than typical due to CRP training and excessive kneeling.She says that using her straussberg sock had made mornings feel much better. Patient has been working on gait correction exercises, correcting the LLE toe in,, but remains somewhat sore in the calves.     Pertinent  History COPD, asthma; OA   Currently in Pain? Yes   Pain Score 2    Pain Location Leg  lower legs   Pain Orientation Right;Left   Pain Descriptors / Indicators Sore   Pain Type Acute pain           [facility] Adult PT Treatment/Exercise - 07/23/15 0001    Ankle Exercises: Standing   SLS --  did not get to today.    Heel Raises 15 reps   Toe Raise --  ankle dorsiflexion: 3x8, heels on 2" step    Other Standing Ankle Exercises narrow stance toe extension   15x5sec hold (HEP addition)           Balance Exercises - 07/23/15 1541    Balance Exercises: Standing   Standing Eyes Opened Foam/compliant surface  narrow stance; 15x trunk rotation bilat   Partial Tandem Stance 5 reps;15 secs             PT Short Term Goals - 07/18/15 1617    PT SHORT TERM GOAL #1   Title Pt to be independent in HEP to allow pt to be able to stand for 15 mintues without increased pain in bilateral heels  to be able to complete dishes    Time 3   Period Weeks   Status On-going   PT SHORT TERM GOAL #2   Title Pt to be able to walk for 15 minutes without increased pain in bilateral heels to be able to  complete short shopping trips in comfort   Time 3   Period Weeks   Status On-going   PT SHORT TERM GOAL #3   Title Pt pain to be no greater than a 5/10 in order to have no difficulty getting to sleep.    Time 3   Period Weeks   Status On-going           PT Long Term Goals - 07/18/15 1617    PT LONG TERM GOAL #1   Title Pt to be in advance HEP in order to decrease  bilateral heel pain to no greater than a 2/10   Time 6   Period Weeks   Status On-going   PT LONG TERM GOAL #2   Title Pt to be able to stand for 30 minutes withou increased bilateral heel pain in order to be able to cook a meal.   Time 6   Period Weeks   Status On-going   PT LONG TERM GOAL #3   Title Pt to be able to walk for 30 minutes without increased heel pain to return to walking for exercise regieme for healthier  lifestyle.    Time 6   Period Weeks   Status On-going   PT LONG TERM GOAL #4   Title Pt to be able to complete her housework without having to rest due to increase heel pain    Time 6   Period Weeks   Status On-going               Plan - 07/23/15 1649    Clinical Impression Statement Pt tolerating treatment well today, able to perform all activities without aggravation of heel or arch pain. Continued to focus on correction of gait and stance during ADL/IADL, to decrease medial foot loading and pt receives education well. Her toe-in on the Left foot is improved since 2 sessions ago.  Added in balance activities this session with toes elevated for arch strengthening and decreased contribution from flexor hallicus.    Rehab Potential Fair   PT Frequency 2x / week   PT Duration 6 weeks   PT Treatment/Interventions Ultrasound;ADLs/Self Care Home Management;Iontophoresis '4mg'$ /ml Dexamethasone;Moist Heat;Gait training;Functional mobility training;Therapeutic activities;Therapeutic exercise;Patient/family education;Manual techniques   PT Next Visit Plan Pt would like to be able to improve ability to STS without use of hands.    PT Home Exercise Plan Printed details for gait training instructions at home.    Consulted and Agree with Plan of Care Patient      Patient will benefit from skilled therapeutic intervention in order to improve the following deficits and impairments:  Decreased activity tolerance, Decreased balance, Decreased strength, Decreased range of motion, Difficulty walking, Pain  Visit Diagnosis: Pain in right lower leg  Pain in left lower leg  Difficulty in walking, not elsewhere classified  Stiffness of right foot, not elsewhere classified  Stiffness of left foot, not elsewhere classified  Dysphagia, pharyngoesophageal phase     Problem List Patient Active Problem List   Diagnosis Date Noted  . Anorectal fissure 10/24/2014  . FH: colon cancer 10/24/2014   . Hx of adenomatous colonic polyps 10/24/2014  . History of pneumonia 06/07/2014  . COPD mixed type ([place]) 06/07/2014  . Pulmonary nodule 06/07/2014  . Abdominal pain, epigastric 05/07/2014  . Hemorrhoids 05/07/2014  . Rectal bleeding   . Rectal pain   . Rectal pain, chronic 12/28/2013  . GERD (gastroesophageal reflux disease) 09/06/2013  . Chronic cough 09/06/2013   4:57 PM,  07/23/2015 Etta Grandchild, PT, DPT Physical Therapist at [facility] [phone] (office)    [facility] [address] [place], [place], [address] Phone: [phone]   Fax:  [phone]  Name: Ellen Bowers MRN: [id number] Date of Birth: 09-Oct-1945

## 2015-07-25 ENCOUNTER — Ambulatory Visit (HOSPITAL_COMMUNITY): Payer: Medicare Other | Admitting: Physical Therapy

## 2015-07-25 DIAGNOSIS — R262 Difficulty in walking, not elsewhere classified: Secondary | ICD-10-CM

## 2015-07-25 DIAGNOSIS — M25674 Stiffness of right foot, not elsewhere classified: Secondary | ICD-10-CM

## 2015-07-25 DIAGNOSIS — M79661 Pain in right lower leg: Secondary | ICD-10-CM | POA: Diagnosis not present

## 2015-07-25 DIAGNOSIS — M25675 Stiffness of left foot, not elsewhere classified: Secondary | ICD-10-CM

## 2015-07-25 DIAGNOSIS — M79662 Pain in left lower leg: Secondary | ICD-10-CM

## 2015-07-25 NOTE — Therapy (Signed)
[place], [place], [address] Phone: [phone]   Fax:  [phone]  Physical Therapy Treatment  Patient Details  Name: Ellen Bowers MRN: [id number] Date of Birth: 07-09-1945 Referring Provider: Dr. Donette Larry  Encounter Date: 07/25/2015      PT End of Session - 07/25/15 1604    Visit Number 9   Number of Visits 12   Date for PT Re-Evaluation 07/21/15   Authorization Type Medicare   Authorization - Visit Number 9   Authorization - Number of Visits 10   PT Start Time 0932   PT Stop Time 1530   PT Time Calculation (min) 52 min   Activity Tolerance Patient tolerated treatment well   Behavior During Therapy [facility] for tasks assessed/performed      Past Medical History  Diagnosis Date  . Asthma   . COPD (chronic obstructive pulmonary disease) ([place])   . GERD (gastroesophageal reflux disease)   . Arthritis   . Von Willebrand disease ([place]) 1996  . Helicobacter pylori gastritis 2005    treated    Past Surgical History  Procedure Laterality Date  . Tubal ligation  1984  . Neck surgery  1951    cyst removal  . Tonsillectomy  1951  . Cataract extraction      double  . Colonoscopy N/A 05/30/2012    Dr.Mann:Multiple polyps removed from the right colon-see description above; otherwise normal colonoscopy up to the cecum. 5 polyps, multiple tubular adenomas. next tcs 05/2015  . Neck surgery  1998    growth removed  . Esophagogastroduodenoscopy  2012    Dr. Collene Mares: few erosions in duodenal bulb. BRAVO off PPI ("severe reflux")  . Flexible sigmoidoscopy N/A 02/05/2014    Dr. Oneida Alar: 5, 2-3 mm sessile sigmoid colon polyps removed, small internal hemorrhoids, moderate external hemorrhoids, 2 anal fissures present. Polyps were hyperplastic.   [name] Esophagogastroduodenoscopy N/A 06/12/2014    SLF: 1. The mucosa of the esophagus appeared normal 2. Mild non-erosive gastrtitis.     There were no vitals filed for  this visit.      Subjective Assessment - 07/25/15 1614    Subjective Pt states she is stil having problems with her knees and feet.  Paying more attention to her walking ans trying to keep her Lt foot into neutral.   Currently in Pain? Yes   Pain Score 2    Pain Location Leg   Pain Orientation Right;Left   Pain Descriptors / Indicators Sore                         [facility] Adult PT Treatment/Exercise - 07/25/15 0001    Manual Therapy   Manual Therapy Soft tissue mobilization   Manual therapy comments Manual complete separate rest of treatment   Soft tissue mobilization Bilateral plantar gastroc/soleus complex  in supine with LE's elevated   Ankle Exercises: Standing   BAPS Standing;Level 2;10 reps   SLS Lt 15". Rt 23" max of 3   Heel Raises 15 reps   Ankle Exercises: Sidelying   Ankle Eversion Both;Weights;15 reps  4# each   Ankle Exercises: Machines for Strengthening   Cybex Leg Press 3 PLx 10 with slow return                   PT Short Term Goals - 07/18/15 1617    PT SHORT TERM GOAL #1   Title Pt to be independent in  HEP to allow pt to be able to stand for 15 mintues without increased pain in bilateral heels  to be able to complete dishes    Time 3   Period Weeks   Status On-going   PT SHORT TERM GOAL #2   Title Pt to be able to walk for 15 minutes without increased pain in bilateral heels to be able to complete short shopping trips in comfort   Time 3   Period Weeks   Status On-going   PT SHORT TERM GOAL #3   Title Pt pain to be no greater than a 5/10 in order to have no difficulty getting to sleep.    Time 3   Period Weeks   Status On-going           PT Long Term Goals - 07/18/15 1617    PT LONG TERM GOAL #1   Title Pt to be in advance HEP in order to decrease  bilateral heel pain to no greater than a 2/10   Time 6   Period Weeks   Status On-going   PT LONG TERM GOAL #2   Title Pt to be able to stand for 30 minutes withou  increased bilateral heel pain in order to be able to cook a meal.   Time 6   Period Weeks   Status On-going   PT LONG TERM GOAL #3   Title Pt to be able to walk for 30 minutes without increased heel pain to return to walking for exercise regieme for healthier lifestyle.    Time 6   Period Weeks   Status On-going   PT LONG TERM GOAL #4   Title Pt to be able to complete her housework without having to rest due to increase heel pain    Time 6   Period Weeks   Status On-going               Plan - 07/25/15 1605    Clinical Impression Statement Continued strengthening and stretching progression this session. BAPS most difficult going into eversion due to weakness.   Neutral gait is improving with patient increased awareness of dysfunction.  Pt states her lateral Lt LE eventually gets fatigued and has to give into her gait with Lt LE ER.  Completed manual with focus on plantar arch and achilles region.  special attention to neuroma on Rt central plantar fascia on.  Pt reported overall improvement at EOS>     Rehab Potential Fair   PT Frequency 2x / week   PT Duration 6 weeks   PT Treatment/Interventions Ultrasound;ADLs/Self Care Home Management;Iontophoresis '4mg'$ /ml Dexamethasone;Moist Heat;Gait training;Functional mobility training;Therapeutic activities;Therapeutic exercise;Patient/family education;Manual techniques   PT Next Visit Plan Continue to progress towards goals.  Gcodes due next session.    PT Home Exercise Plan Printed details for gait training instructions at home.    Consulted and Agree with Plan of Care Patient      Patient will benefit from skilled therapeutic intervention in order to improve the following deficits and impairments:  Decreased activity tolerance, Decreased balance, Decreased strength, Decreased range of motion, Difficulty walking, Pain  Visit Diagnosis: Pain in right lower leg  Pain in left lower leg  Difficulty in walking, not elsewhere  classified  Stiffness of right foot, not elsewhere classified  Stiffness of left foot, not elsewhere classified     Problem List Patient Active Problem List   Diagnosis Date Noted  . Anorectal fissure 10/24/2014  . FH: colon cancer 10/24/2014  .  Hx of adenomatous colonic polyps 10/24/2014  . History of pneumonia 06/07/2014  . COPD mixed type ([place]) 06/07/2014  . Pulmonary nodule 06/07/2014  . Abdominal pain, epigastric 05/07/2014  . Hemorrhoids 05/07/2014  . Rectal bleeding   . Rectal pain   . Rectal pain, chronic 12/28/2013  . GERD (gastroesophageal reflux disease) 09/06/2013  . Chronic cough 09/06/2013    Teena Irani, PTA/CLT [phone]  07/25/2015, 4:15 PM  [place] [address] [place], [place], [address] Phone: [phone]   Fax:  [phone]  Name: VINETTE CRITES MRN: [id number] Date of Birth: 06-09-1945

## 2015-07-30 ENCOUNTER — Ambulatory Visit (HOSPITAL_COMMUNITY): Payer: Medicare Other | Admitting: Physical Therapy

## 2015-08-01 ENCOUNTER — Ambulatory Visit (HOSPITAL_COMMUNITY): Payer: Medicare Other | Admitting: Physical Therapy

## 2015-08-01 DIAGNOSIS — M79662 Pain in left lower leg: Secondary | ICD-10-CM

## 2015-08-01 DIAGNOSIS — M25674 Stiffness of right foot, not elsewhere classified: Secondary | ICD-10-CM

## 2015-08-01 DIAGNOSIS — M79661 Pain in right lower leg: Secondary | ICD-10-CM | POA: Diagnosis not present

## 2015-08-01 DIAGNOSIS — M25675 Stiffness of left foot, not elsewhere classified: Secondary | ICD-10-CM

## 2015-08-01 DIAGNOSIS — R262 Difficulty in walking, not elsewhere classified: Secondary | ICD-10-CM

## 2015-08-01 NOTE — Therapy (Signed)
[place], [place], [address] Phone: [phone]   Fax:  [phone]  Physical Therapy Treatment  Patient Details  Name: Ellen Bowers MRN: [id number] Date of Birth: [birth year] Referring Provider: Dr. Donette Larry  Encounter Date: 08/01/2015      PT End of Session - 08/01/15 1502    Visit Number 10   Number of Visits 12   Date for PT Re-Evaluation 08/07/15   Authorization Type Medicare   Authorization - Visit Number 10   Authorization - Number of Visits 10   PT Start Time 1306   PT Stop Time 1350   PT Time Calculation (min) 44 min   Activity Tolerance Patient tolerated treatment well   Behavior During Therapy [facility] for tasks assessed/performed      Past Medical History  Diagnosis Date  . Asthma   . COPD (chronic obstructive pulmonary disease) ([place])   . GERD (gastroesophageal reflux disease)   . Arthritis   . Von Willebrand disease ([place]) 1996  . Helicobacter pylori gastritis 2005    treated    Past Surgical History  Procedure Laterality Date  . Tubal ligation  1984  . Neck surgery  1951    cyst removal  . Tonsillectomy  1951  . Cataract extraction      double  . Colonoscopy N/A 05/30/2012    Dr.Mann:Multiple polyps removed from the right colon-see description above; otherwise normal colonoscopy up to the cecum. 5 polyps, multiple tubular adenomas. next tcs 05/2015  . Neck surgery  1998    growth removed  . Esophagogastroduodenoscopy  2012    Dr. Collene Mares: few erosions in duodenal bulb. BRAVO off PPI ("severe reflux")  . Flexible sigmoidoscopy N/A 02/05/2014    Dr. Oneida Alar: 5, 2-3 mm sessile sigmoid colon polyps removed, small internal hemorrhoids, moderate external hemorrhoids, 2 anal fissures present. Polyps were hyperplastic.   [name] Esophagogastroduodenoscopy N/A 06/12/2014    SLF: 1. The mucosa of the esophagus appeared normal 2. Mild non-erosive gastrtitis.     There were no vitals filed  for this visit.      Subjective Assessment - 08/01/15 1506    Subjective Pt reports her symptoms have decreased and improved, however still has a constant nagging pain in her feet that lets her know it is still there.    Currently in Pain? Yes   Pain Score 1    Pain Location Foot   Pain Orientation Right;Left   Pain Descriptors / Indicators Aching;Nagging            [facility] PT Assessment - 08/01/15 0001    Assessment   Medical Diagnosis Bilateral foot pain   Observation/Other Assessments   Focus on Therapeutic Outcomes (FOTO)  51% functional status (was 47%)                     [facility] Adult PT Treatment/Exercise - 08/01/15 0001    Manual Therapy   Manual Therapy Soft tissue mobilization   Manual therapy comments Manual complete separate rest of treatment   Soft tissue mobilization Bilateral plantar gastroc/soleus complex  in supine with LE's elevated   Ankle Exercises: Standing   BAPS Standing;Level 2;10 reps   SLS Lt 15". Rt 23" max of 3   Heel Raises 15 reps                  PT Short Term Goals - 07/18/15 1617    PT SHORT TERM GOAL #1  Title Pt to be independent in HEP to allow pt to be able to stand for 15 mintues without increased pain in bilateral heels  to be able to complete dishes    Time 3   Period Weeks   Status On-going   PT SHORT TERM GOAL #2   Title Pt to be able to walk for 15 minutes without increased pain in bilateral heels to be able to complete short shopping trips in comfort   Time 3   Period Weeks   Status On-going   PT SHORT TERM GOAL #3   Title Pt pain to be no greater than a 5/10 in order to have no difficulty getting to sleep.    Time 3   Period Weeks   Status On-going           PT Long Term Goals - 07/18/15 1617    PT LONG TERM GOAL #1   Title Pt to be in advance HEP in order to decrease  bilateral heel pain to no greater than a 2/10   Time 6   Period Weeks   Status On-going   PT LONG TERM GOAL #2   Title  Pt to be able to stand for 30 minutes withou increased bilateral heel pain in order to be able to cook a meal.   Time 6   Period Weeks   Status On-going   PT LONG TERM GOAL #3   Title Pt to be able to walk for 30 minutes without increased heel pain to return to walking for exercise regieme for healthier lifestyle.    Time 6   Period Weeks   Status On-going   PT LONG TERM GOAL #4   Title Pt to be able to complete her housework without having to rest due to increase heel pain    Time 6   Period Weeks   Status On-going               Plan - 08/01/15 1306    Clinical Impression Statement Pt completed FOTO with 4 point improvement since initial evaluation.  Pt does report les pain, however still with the "annoying" constant awareness in her feet.  Continued with manual this session with much more tightness noted in Rt compared to Lt.  Pt reported being painfree at end of session.   Rehab Potential Fair   PT Frequency 2x / week   PT Duration 6 weeks   PT Treatment/Interventions Ultrasound;ADLs/Self Care Home Management;Iontophoresis '4mg'$ /ml Dexamethasone;Moist Heat;Gait training;Functional mobility training;Therapeutic activities;Therapeutic exercise;Patient/family education;Manual techniques   PT Next Visit Plan Re-evaluate next session.    PT Home Exercise Plan --   Consulted and Agree with Plan of Care Patient      Patient will benefit from skilled therapeutic intervention in order to improve the following deficits and impairments:  Decreased activity tolerance, Decreased balance, Decreased strength, Decreased range of motion, Difficulty walking, Pain  Visit Diagnosis: Pain in right lower leg  Pain in left lower leg  Difficulty in walking, not elsewhere classified  Stiffness of right foot, not elsewhere classified  Stiffness of left foot, not elsewhere classified  G8978 CK G8979 CK   Problem List Patient Active Problem List   Diagnosis Date Noted  . Anorectal  fissure 10/24/2014  . FH: colon cancer 10/24/2014  . Hx of adenomatous colonic polyps 10/24/2014  . History of pneumonia 06/07/2014  . COPD mixed type ([place]) 06/07/2014  . Pulmonary nodule 06/07/2014  . Abdominal pain, epigastric 05/07/2014  . Hemorrhoids  05/07/2014  . Rectal bleeding   . Rectal pain   . Rectal pain, chronic 12/28/2013  . GERD (gastroesophageal reflux disease) 09/06/2013  . Chronic cough 09/06/2013    Teena Irani, PTA/CLT [place], PT CLT [phone] 08/01/2015, 3:08 PM Rayetta Humphrey, PT CLT [place] [address] [place], [place], [address] Phone: [phone]   Fax:  [phone]  Name: Ellen Bowers MRN: [id number] Date of Birth: 1945/08/13

## 2015-08-07 ENCOUNTER — Ambulatory Visit (HOSPITAL_COMMUNITY): Payer: Medicare Other | Admitting: Physical Therapy

## 2015-08-07 DIAGNOSIS — M79661 Pain in right lower leg: Secondary | ICD-10-CM

## 2015-08-07 DIAGNOSIS — M79662 Pain in left lower leg: Secondary | ICD-10-CM

## 2015-08-07 DIAGNOSIS — M25674 Stiffness of right foot, not elsewhere classified: Secondary | ICD-10-CM

## 2015-08-07 DIAGNOSIS — R262 Difficulty in walking, not elsewhere classified: Secondary | ICD-10-CM

## 2015-08-07 NOTE — Therapy (Signed)
[place], [place], [address] Phone: [phone]   Fax:  [phone]  Physical Therapy Treatment  Patient Details  Name: CRYSTIE YANKO MRN: [id number] Date of Birth: [birth year] Referring Provider: Dr. Donette Larry  Encounter Date: 08/07/2015      PT End of Session - 08/07/15 1210    Visit Number 11   Number of Visits 11   Date for PT Re-Evaluation 08/07/15   Authorization Type Medicare   Authorization - Visit Number 11   Authorization - Number of Visits 11   PT Start Time 1129   PT Stop Time 1207   PT Time Calculation (min) 38 min   Activity Tolerance Patient tolerated treatment well   Behavior During Therapy [facility] for tasks assessed/performed      Past Medical History:  Diagnosis Date  . Arthritis   . Asthma   . COPD (chronic obstructive pulmonary disease) ([place])   . GERD (gastroesophageal reflux disease)   . Helicobacter pylori gastritis 2005   treated  . Von Willebrand disease ([place]) 1996    Past Surgical History:  Procedure Laterality Date  . CATARACT EXTRACTION     double  . COLONOSCOPY N/A 05/30/2012   Dr.Mann:Multiple polyps removed from the right colon-see description above; otherwise normal colonoscopy up to the cecum. 5 polyps, multiple tubular adenomas. next tcs 05/2015  . ESOPHAGOGASTRODUODENOSCOPY  2012   Dr. Collene Mares: few erosions in duodenal bulb. BRAVO off PPI ("severe reflux")  . ESOPHAGOGASTRODUODENOSCOPY N/A 06/12/2014   SLF: 1. The mucosa of the esophagus appeared normal 2. Mild non-erosive gastrtitis.   [name] FLEXIBLE SIGMOIDOSCOPY N/A 02/05/2014   Dr. Oneida Alar: 5, 2-3 mm sessile sigmoid colon polyps removed, small internal hemorrhoids, moderate external hemorrhoids, 2 anal fissures present. Polyps were hyperplastic.   [name] NECK SURGERY  1951   cyst removal  . NECK SURGERY  1998   growth removed  . TONSILLECTOMY  1951  . TUBAL LIGATION  1984    There were no vitals filed for this  visit.          [facility] PT Assessment - 08/07/15 0001      Assessment   Medical Diagnosis Bilateral foot pain   Onset Date/Surgical Date 05/13/15   Next MD Visit unknown   Prior Therapy none     Precautions   Precautions None     Restrictions   Weight Bearing Restrictions No     Home Environment   Living Environment Private residence     Prior Function   Level of Independence Independent     Cognition   Overall Cognitive Status Within Functional Limits for tasks assessed     Observation/Other Assessments   Focus on Therapeutic Outcomes (FOTO)  51  was 47     Functional Tests   Functional tests Single leg stance     Single Leg Stance   Comments Rt: 44 ; Lt 60  was Rt 10; Lt 4     AROM   Right Knee Extension -5  was -10   Left Knee Extension 0  was -7   Right Ankle Dorsiflexion 10   Right Ankle Plantar Flexion 70  was 40   Right Ankle Inversion 30  was 28   Right Ankle Eversion 25  was 20   Left Ankle Dorsiflexion 12  was 8   Left Ankle Plantar Flexion 65  was 58   Left Ankle Inversion 30  was 20   Left Ankle Eversion 18  15     Strength   Right Ankle Dorsiflexion 5/5   Right Ankle Plantar Flexion 4+/5  was 4/5    Right Ankle Inversion 5/5  was 4/5   Right Ankle Eversion 5/5   Left Ankle Dorsiflexion 5/5   Left Ankle Plantar Flexion 4+/5  was 4/5    Left Ankle Inversion 5/5  was 4/5   Left Ankle Eversion 5/5     Palpation   Palpation comment tightnes in mm in B gastroc is gone                     [facility] Adult PT Treatment/Exercise - 08/07/15 0001      Manual Therapy   Manual Therapy Soft tissue mobilization   Manual therapy comments Manual complete separate rest of treatment   Soft tissue mobilization Bilateral plantar gastroc/soleus complex  in supine with LE's elevated                  PT Short Term Goals - 08/07/15 1143      PT SHORT TERM GOAL #1   Title Pt to be independent in HEP to allow pt to be able  to stand for 15 mintues without increased pain in bilateral heels  to be able to complete dishes    Time 3   Period Weeks   Status Achieved     PT SHORT TERM GOAL #2   Title Pt to be able to walk for 15 minutes without increased pain in bilateral heels to be able to complete short shopping trips in comfort   Time 3   Period Weeks   Status Achieved     PT SHORT TERM GOAL #3   Title Pt pain to be no greater than a 5/10 in order to have no difficulty getting to sleep.    Time 3   Period Weeks   Status Achieved           PT Long Term Goals - 08/07/15 1145      PT LONG TERM GOAL #1   Title Pt to be in advance HEP in order to decrease  bilateral heel pain to no greater than a 2/10   Baseline pain is up due to increased gardening and activity this week    Time 6   Period Weeks   Status Partially Met     PT LONG TERM GOAL #2   Title Pt to be able to stand for 30 minutes withou increased bilateral heel pain in order to be able to cook a meal.   Baseline 20-30 minutes    Time 6   Period Weeks   Status Partially Met     PT LONG TERM GOAL #3   Title Pt to be able to walk for 30 minutes without increased heel pain to return to walking for exercise regieme for healthier lifestyle.    Baseline not achieved mainly due to the weather.    Time 6   Period Weeks   Status On-going     PT LONG TERM GOAL #4   Title Pt to be able to complete her housework without having to rest due to increase heel pain    Time 6   Period Weeks   Status Achieved               Plan - 08/07/15 1214    Clinical Impression Statement Ms. Krajewski was reassessed with ROM and mm strength wfl at this time.  All Short  term goals were met.  Long term goals have been partially met.  Pt feels that she is ready for discharge and will continue her HEP  on her own.  Reviewed HEP with pt with all questions answered .    Rehab Potential Fair   PT Frequency 2x / week   PT Duration 6 weeks   PT  Treatment/Interventions Ultrasound;ADLs/Self Care Home Management;Iontophoresis 69m/ml Dexamethasone;Moist Heat;Gait training;Functional mobility training;Therapeutic activities;Therapeutic exercise;Patient/family education;Manual techniques   PT Next Visit Plan Discharge pt from therapy at this time.    Consulted and Agree with Plan of Care Patient      Patient will benefit from skilled therapeutic intervention in order to improve the following deficits and impairments:  Decreased activity tolerance, Decreased balance, Decreased strength, Decreased range of motion, Difficulty walking, Pain  Visit Diagnosis: Pain in right lower leg - Plan: PT plan of care cert/re-cert  Pain in left lower leg - Plan: PT plan of care cert/re-cert  Difficulty in walking, not elsewhere classified - Plan: PT plan of care cert/re-cert  Stiffness of right foot, not elsewhere classified - Plan: PT plan of care cert/re-cert       G-Codes - [id number]/10/1215   Functional Limitation Mobility: Walking and moving around   Mobility: Walking and Moving Around Goal Status [phone] At least 40 percent but less than 60 percent impaired, limited or restricted   Mobility: Walking and Moving Around Discharge Status [phone] At least 40 percent but less than 60 percent impaired, limited or restricted      Problem List Patient Active Problem List   Diagnosis Date Noted  . Anorectal fissure 10/24/2014  . FH: colon cancer 10/24/2014  . Hx of adenomatous colonic polyps 10/24/2014  . History of pneumonia 06/07/2014  . COPD mixed type ([place] 06/07/2014  . Pulmonary nodule 06/07/2014  . Abdominal pain, epigastric 05/07/2014  . Hemorrhoids 05/07/2014  . Rectal bleeding   . Rectal pain   . Rectal pain, chronic 12/28/2013  . GERD (gastroesophageal reflux disease) 09/06/2013  . Chronic cough 09/06/2013    CRayetta Humphrey PT CLT [phone]) [phone] 12:21 PM  [place] [address]Phone: [phone]  Fax:  [phone] Name: RALLYSSIA SKLUZACEKMRN: [id number] of Birth: [birth year] PHYSICAL THERAPY DISCHARGE SUMMARY  Visits from Start of Care: 11  Current functional level related to goals / functional outcomes: See above   Remaining deficits: See above   Education / Equipment: HEP Plan: Patient agrees to discharge.  Patient goals were partially met. Patient is being discharged due to being pleased with the current functional level.  ?????       CRayetta Humphrey PLorainCLT [phone]

## 2015-08-07 NOTE — Patient Instructions (Addendum)
Gastroc Stretch    Stand with right foot back, leg straight, forward leg bent. Keeping heel on floor, turned slightly out, lean into wall until stretch is felt in calf. Hold _20___ seconds. Repeat _3___ times per set. Do ___1_ sets per session. Do __2__ sessions per day.  http://orth.exer.us/26   Copyright  VHI. All rights reserved.  Plantar Fascia Stretch    Standing with only ball of left foot on stair, push heel down until stretch is felt through arch of foot. Hold __20__ seconds. Relax. Repeat _3___ times per set. Do __1__ sets per session. Do ____2 sessions per day.  http://orth.exer.us/22   Copyright  VHI. All rights reserved.  Balance: Unilateral    Attempt to balance on left leg, eyes open. Hold 60____ seconds. Repeat _1___ times per set. Do 1____ sets per session. Do ___1_ sessions per day. Perform exercise with eyes closed.  http://orth.exer.us/28   Copyright  VHI. All rights reserved.  Heel Raise: Unilateral (Standing)   While holding onto a counter Balance on left foot, then rise on ball of foot. Repeat _10___ times per set. Do __1__ sets per session. Do __2  __ sessions per day.  http://orth.exer.us/40   Copyright  VHI. All rights reserved.

## 2015-09-12 ENCOUNTER — Encounter: Payer: Self-pay | Admitting: Gastroenterology

## 2015-11-13 ENCOUNTER — Encounter: Payer: Self-pay | Admitting: Gastroenterology

## 2015-11-13 ENCOUNTER — Ambulatory Visit (INDEPENDENT_AMBULATORY_CARE_PROVIDER_SITE_OTHER): Payer: Medicare Other | Admitting: Gastroenterology

## 2015-11-13 ENCOUNTER — Other Ambulatory Visit: Payer: Self-pay

## 2015-11-13 VITALS — BP 131/75 | HR 87 | Temp 98.0°F | Ht 69.0 in | Wt 199.0 lb

## 2015-11-13 DIAGNOSIS — Z800 Family history of malignant neoplasm of digestive organs: Secondary | ICD-10-CM | POA: Diagnosis not present

## 2015-11-13 DIAGNOSIS — K6289 Other specified diseases of anus and rectum: Secondary | ICD-10-CM | POA: Diagnosis not present

## 2015-11-13 DIAGNOSIS — G8929 Other chronic pain: Secondary | ICD-10-CM | POA: Diagnosis not present

## 2015-11-13 DIAGNOSIS — Z86010 Personal history of colonic polyps: Secondary | ICD-10-CM

## 2015-11-13 MED ORDER — NA SULFATE-K SULFATE-MG SULF 17.5-3.13-1.6 GM/177ML PO SOLN: 1.0000 | ORAL | 0 refills | Status: DC

## 2015-11-13 NOTE — Patient Instructions (Signed)
1. Colonoscopy in the near future with Dr. Oneida Alar. See separate instructions.

## 2015-11-13 NOTE — Progress Notes (Addendum)
REVIEWED. OBTAIN AND DOCUMENT REC'S FROM HEMATOLOGY.  Primary Care Physician:  [name], MD  Primary Gastroenterologist:  Barney Drain, MD   Chief Complaint  Patient presents with  . Colonoscopy    HPI:  Ellen Bowers is a 70 y.o. female here to schedule surveillance colonoscopy. Both parents had colon cancer, mother before the age of 6, father succumbed to metastatic colon cancer at age 46.[name] She has a personal history of adenomatous colon polyps. Four tubular adenomas removed from her colon in May 2014. Patient is due for surveillance exam at this time. She has chronic intermittent rectal pain with previous history of internal/external hemorrhoids and 2 anal fissures on flexible sigmoidoscopy in January 2016. Patient uses nitroglycerin ointment or Preparation H as needed. Bowel movements are regular. No melena. Occasional bright red blood per rectum. Denies abdominal pain. Heartburn well-controlled on Dexilant once daily. Denies dysphagia, odynophagia. Patient previously did not tolerate golytely due to vomiting therefore will plan on short volume prep.   PATIENT HAS H/O VONWILLEBRAND'S DISEASE  Current Outpatient Prescriptions  Medication Sig Dispense Refill  . albuterol (PROVENTIL HFA;VENTOLIN HFA) 108 (90 BASE) MCG/ACT inhaler Inhale 2 puffs into the lungs every 6 (six) hours as needed for wheezing or shortness of breath.    [name] alum & mag hydroxide-simeth (MAALOX/MYLANTA) 200-200-20 MG/5ML suspension Take 15 mLs by mouth as needed for indigestion or heartburn.    . Ascorbic Acid (VITAMIN C) 1000 MG tablet Take 1,000 mg by mouth daily.    [name] aspirin 81 MG tablet Take 81 mg by mouth daily.    . budesonide (PULMICORT) 0.5 MG/2ML nebulizer solution Take 0.5 mg by nebulization 2 (two) times daily.    [name] buPROPion (WELLBUTRIN SR) 150 MG 12 hr tablet Take 150 mg by mouth 2 (two) times daily.    . celecoxib (CELEBREX) 200 MG capsule Take 200 mg by mouth daily.     . cetirizine (ZYRTEC)  10 MG tablet Take 10 mg by mouth daily.    [name] dexlansoprazole (DEXILANT) 60 MG capsule Take 1 capsule (60 mg total) by mouth 2 (two) times daily. (Patient taking differently: Take 60 mg by mouth daily. ) 180 capsule 3  . docusate sodium (COLACE) 100 MG capsule Take 100 mg by mouth 2 (two) times daily.    . Fluticasone-Salmeterol (ADVAIR) 250-50 MCG/DOSE AEPB Inhale 1 puff into the lungs every 12 (twelve) hours.    [name] ipratropium-albuterol (DUONEB) 0.5-2.5 (3) MG/3ML SOLN Take 3 mLs by nebulization every 6 (six) hours as needed (shortness of breath).    . montelukast (SINGULAIR) 10 MG tablet Take 10 mg by mouth daily.     . Nitroglycerin 0.4 % OINT Place 1 drop rectally 3 (three) times daily. FOR 21 DAYS (Patient taking differently: Place 1 drop rectally 3 (three) times daily as needed. FOR 21 DAYS) 15 g 1  . pravastatin (PRAVACHOL) 20 MG tablet Take 20 mg by mouth daily.    . solifenacin (VESICARE) 5 MG tablet Take 5 mg by mouth daily.     No current facility-administered medications for this visit.     Allergies as of 11/13/2015 - Review Complete 11/13/2015  Allergen Reaction Noted  . Demerol [meperidine] Nausea And Vomiting 06/12/2014  . Betadine [povidone iodine] Rash 05/30/2012    Past Medical History:  Diagnosis Date  . Arthritis   . Asthma   . COPD (chronic obstructive pulmonary disease) ([place])   . GERD (gastroesophageal reflux disease)   . Helicobacter pylori gastritis 2005   treated  .  Von Willebrand disease ([place]) 1996    Past Surgical History:  Procedure Laterality Date  . CATARACT EXTRACTION     double  . COLONOSCOPY N/A 05/30/2012   Dr.Mann:Multiple polyps removed from the right colon-see description above; otherwise normal colonoscopy up to the cecum. 5 polyps, multiple tubular adenomas. next tcs 05/2015  . ESOPHAGOGASTRODUODENOSCOPY  2012   Dr. Collene Mares: few erosions in duodenal bulb. BRAVO off PPI ("severe reflux")  . ESOPHAGOGASTRODUODENOSCOPY N/A 06/12/2014   [facility]: 1. The  mucosa of the esophagus appeared normal 2. Mild non-erosive gastrtitis.   [name] FLEXIBLE SIGMOIDOSCOPY N/A 02/05/2014   Dr. Oneida Alar: 5, 2-3 mm sessile sigmoid colon polyps removed, small internal hemorrhoids, moderate external hemorrhoids, 2 anal fissures present. Polyps were hyperplastic.   [name] NECK SURGERY  1951   cyst removal  . NECK SURGERY  1998   growth removed  . TONSILLECTOMY  1951  . TUBAL LIGATION  1984    Family History  Problem Relation Age of Onset  . Colon cancer Mother     less than age 30  . Colon cancer Father     more than age 92, died from metastatic disease at age 36  . Liver disease Neg Hx     Social History   Social History  . Marital status: Married    Spouse name: N/A  . Number of children: N/A  . Years of education: N/A   Occupational History  . Not on file.   Social History Main Topics  . Smoking status: Former Smoker    Packs/day: 1.00    Years: 50.00    Types: Cigarettes    Quit date: 05/30/2009  . Smokeless tobacco: Not on file     Comment: Quit smoking x 5 years  . Alcohol use Yes     Comment: occasionally    . Drug use: No  . Sexual activity: Not on file   Other Topics Concern  . Not on file   Social History Narrative  . No narrative on file      ROS:  General: Negative for anorexia, weight loss, fever, chills, fatigue, weakness. Eyes: Negative for vision changes.  ENT: Negative for hoarseness, difficulty swallowing , nasal congestion. CV: Negative for chest pain, angina, palpitations, dyspnea on exertion, peripheral edema.  Respiratory: Negative for dyspnea at rest, dyspnea on exertion, cough, sputum, wheezing.  GI: See history of present illness. GU:  Negative for dysuria, hematuria, urinary incontinence, urinary frequency, nocturnal urination.  MS: Negative for joint pain, low back pain.  Derm: Negative for rash or itching.  Neuro: Negative for weakness, abnormal sensation, seizure, frequent headaches, memory loss, confusion.   Psych: Negative for anxiety, depression, suicidal ideation, hallucinations.  Endo: Negative for unusual weight change.  Heme: Negative for bruising or bleeding. Allergy: Negative for rash or hives.    Physical Examination:  BP 131/75   Pulse 87   Temp 98 F (36.7 C) (Oral)   Ht '5\' 9"'$  (1.753 m)   Wt 199 lb (90.3 kg)   BMI 29.39 kg/m    General: Well-nourished, well-developed in no acute distress.  Head: Normocephalic, atraumatic.   Eyes: Conjunctiva pink, no icterus. Mouth: Oropharyngeal mucosa moist and pink , no lesions erythema or exudate. Neck: Supple without thyromegaly, masses, or lymphadenopathy.  Lungs: Clear to auscultation bilaterally.  Heart: Regular rate and rhythm, no murmurs rubs or gallops.  Abdomen: Bowel sounds are normal, nontender, nondistended, no hepatosplenomegaly or masses, no abdominal bruits or    hernia , no  rebound or guarding.   Rectal: not performed Extremities: No lower extremity edema. No clubbing or deformities.  Neuro: Alert and oriented x 4 , grossly normal neurologically.  Skin: Warm and dry, no rash or jaundice.   Psych: Alert and cooperative, normal mood and affect.

## 2015-11-13 NOTE — Assessment & Plan Note (Signed)
Personal history of adenomatous colon polyps, both parents had colon cancer as well. She is due for surveillance colonoscopy.  I have discussed the risks, alternatives, benefits with regards to but not limited to the risk of reaction to medication, bleeding, infection, perforation and the patient is agreeable to proceed. Written consent to be obtained.  Patient has a history of von Willebrand's disease. States she has done well with endoscopic evaluation without any intervention. She also develops nausea and vomiting related to Demerol and reports in adequate sedation with it. She does well with fentanyl and Versed sedation however.

## 2015-11-14 NOTE — Progress Notes (Signed)
Please find out if patient actively sees a hematologist. Dr. Oneida Alar wants [place] to contact hematology for recommendations regarding h/o von Willebrand's.

## 2015-11-14 NOTE — Progress Notes (Signed)
Pt said she is not currently seeing hematologist and hasn't for about 16 years.

## 2015-11-14 NOTE — Patient Instructions (Signed)
PA# for TCS: [id number]

## 2015-11-14 NOTE — Progress Notes (Signed)
cc'ed to pcp °

## 2015-11-21 NOTE — Progress Notes (Signed)
OK. I have sent staff message to hematology at [facility] for input.

## 2015-11-25 ENCOUNTER — Telehealth: Payer: Self-pay | Admitting: Gastroenterology

## 2015-11-27 NOTE — Progress Notes (Signed)
I spoke with patient. She states she has had multiple surgeries and endoscopies without any complications of bleeding and has never had any type of medications provided perioperatively for vonWillebrands. She wants to pursue colonoscopy as planned and will assume risk that she may be at increased risk of bleeding.   She declines hematology referral.

## 2015-11-27 NOTE — Telephone Encounter (Signed)
Opened in error

## 2015-11-27 NOTE — Progress Notes (Signed)
SPOKE WITH TOM KEFALAS, PA-C. HE IS SKEPTICAL THAT PT HAS VWD.   PLEASE CALL PT. SHE HAS TWO OPTIONS: 1. SHE CAN SEE HEMATOLOGY PRIOR TO  NEXT TCS OR 2. PROCEED WITH TCS UNDERSTANDING THAT IF SHE HAS TYPE 2 OR TYPE 3 VWD SHE HAS AN INCREASED RISK OF BLEEDING WITH BIOPSIES/POLYP REMOVAL.

## 2015-12-13 ENCOUNTER — Ambulatory Visit (HOSPITAL_COMMUNITY)
Admission: RE | Admit: 2015-12-13 | Discharge: 2015-12-13 | Disposition: A | Discharge status: Home / Self Care | Payer: Medicare Other | Source: Ambulatory Visit | Attending: Gastroenterology | Admitting: Gastroenterology

## 2015-12-13 ENCOUNTER — Encounter (HOSPITAL_COMMUNITY)
Admission: RE | Disposition: A | Discharge status: Home / Self Care | Payer: Self-pay | Source: Ambulatory Visit | Attending: Gastroenterology

## 2015-12-13 ENCOUNTER — Encounter (HOSPITAL_COMMUNITY): Payer: Self-pay | Admitting: *Deleted

## 2015-12-13 DIAGNOSIS — D124 Benign neoplasm of descending colon: Secondary | ICD-10-CM | POA: Diagnosis not present

## 2015-12-13 DIAGNOSIS — K621 Rectal polyp: Secondary | ICD-10-CM | POA: Diagnosis not present

## 2015-12-13 DIAGNOSIS — K648 Other hemorrhoids: Secondary | ICD-10-CM | POA: Insufficient documentation

## 2015-12-13 DIAGNOSIS — K644 Residual hemorrhoidal skin tags: Secondary | ICD-10-CM | POA: Diagnosis not present

## 2015-12-13 DIAGNOSIS — D680 Von Willebrand's disease: Secondary | ICD-10-CM | POA: Diagnosis not present

## 2015-12-13 DIAGNOSIS — M1990 Unspecified osteoarthritis, unspecified site: Secondary | ICD-10-CM | POA: Diagnosis not present

## 2015-12-13 DIAGNOSIS — Z87891 Personal history of nicotine dependence: Secondary | ICD-10-CM | POA: Diagnosis not present

## 2015-12-13 DIAGNOSIS — Z86010 Personal history of colonic polyps: Secondary | ICD-10-CM

## 2015-12-13 DIAGNOSIS — Z79899 Other long term (current) drug therapy: Secondary | ICD-10-CM | POA: Diagnosis not present

## 2015-12-13 DIAGNOSIS — Z800 Family history of malignant neoplasm of digestive organs: Secondary | ICD-10-CM | POA: Diagnosis not present

## 2015-12-13 DIAGNOSIS — J449 Chronic obstructive pulmonary disease, unspecified: Secondary | ICD-10-CM | POA: Insufficient documentation

## 2015-12-13 DIAGNOSIS — K219 Gastro-esophageal reflux disease without esophagitis: Secondary | ICD-10-CM | POA: Diagnosis not present

## 2015-12-13 DIAGNOSIS — Z1211 Encounter for screening for malignant neoplasm of colon: Secondary | ICD-10-CM | POA: Diagnosis present

## 2015-12-13 HISTORY — PX: COLONOSCOPY: SHX5424

## 2015-12-13 SURGERY — COLONOSCOPY
Anesthesia: Moderate Sedation

## 2015-12-13 MED ORDER — PROMETHAZINE HCL 25 MG/ML IJ SOLN
INTRAMUSCULAR | Status: DC | PRN
  Administered 2015-12-13: 12.5 mg via INTRAVENOUS

## 2015-12-13 MED ORDER — HYDROCORTISONE ACE-PRAMOXINE 1-1 % RE CREA: TOPICAL_CREAM | RECTAL | 0 refills | Status: DC

## 2015-12-13 MED ORDER — STERILE WATER FOR IRRIGATION IR SOLN
Status: DC | PRN
  Administered 2015-12-13: 2.5 mL

## 2015-12-13 MED ORDER — MIDAZOLAM HCL 5 MG/5ML IJ SOLN
INTRAMUSCULAR | Status: DC | PRN
Start: 2015-12-13 — End: 2015-12-13
  Administered 2015-12-13: 1 mg via INTRAVENOUS
  Administered 2015-12-13: 2 mg via INTRAVENOUS
  Administered 2015-12-13: 1 mg via INTRAVENOUS
  Administered 2015-12-13 (×2): 2 mg via INTRAVENOUS
  Administered 2015-12-13: 1 mg via INTRAVENOUS

## 2015-12-13 MED ORDER — MEPERIDINE HCL 100 MG/ML IJ SOLN
INTRAMUSCULAR | Status: DC
Start: 2015-12-13 — End: 2015-12-13
  Filled 2015-12-13: qty 2

## 2015-12-13 MED ORDER — FENTANYL CITRATE (PF) 100 MCG/2ML IJ SOLN
INTRAMUSCULAR | Status: AC
  Filled 2015-12-13: qty 2

## 2015-12-13 MED ORDER — SIMETHICONE 40 MG/0.6ML PO SUSP
ORAL | Status: AC
  Filled 2015-12-13: qty 30

## 2015-12-13 MED ORDER — FENTANYL CITRATE (PF) 100 MCG/2ML IJ SOLN
INTRAMUSCULAR | Status: DC | PRN
  Administered 2015-12-13 (×4): 25 ug via INTRAVENOUS
  Administered 2015-12-13: 50 ug via INTRAVENOUS

## 2015-12-13 MED ORDER — MIDAZOLAM HCL 5 MG/5ML IJ SOLN
INTRAMUSCULAR | Status: AC
  Filled 2015-12-13: qty 10

## 2015-12-13 MED ORDER — LIDOCAINE VISCOUS 2 % MT SOLN
OROMUCOSAL | Status: AC
  Filled 2015-12-13: qty 15

## 2015-12-13 MED ORDER — PROMETHAZINE HCL 25 MG/ML IJ SOLN
INTRAMUSCULAR | Status: AC
  Filled 2015-12-13: qty 1

## 2015-12-13 MED ORDER — SODIUM CHLORIDE 0.9% FLUSH
INTRAVENOUS | Status: AC
  Filled 2015-12-13: qty 10

## 2015-12-13 MED ORDER — SODIUM CHLORIDE 0.9 % IV SOLN
INTRAVENOUS | Status: DC
  Administered 2015-12-13: 08:00:00 via INTRAVENOUS

## 2015-12-13 NOTE — Discharge Instructions (Signed)
You had 5 polyps removed. You have internal hemorrhoids.   CONTINUE YOUR WEIGHT LOSS EFFORTS. LOSE TEN POUNDS.  DRINK WATER TO KEEP YOUR URINE LIGHT YELLOW.  FOLLOW A HIGH FIBER DIET. AVOID ITEMS THAT CAUSE BLOATING & GAS. SEE INFO BELOW.  YOUR BIOPSY RESULTS WILL BE AVAILABLE IN MY CHART AFTER DEC 4 AND MY OFFICE WILL CONTACT YOU IN 10-14 DAYS WITH YOUR RESULTS.   Next colonoscopy in 3-5 years.    Colonoscopy Care After Read the instructions outlined below and refer to this sheet in the next week. These discharge instructions provide you with general information on caring for yourself after you leave the hospital. While your treatment has been planned according to the most current medical practices available, unavoidable complications occasionally occur. If you have any problems or questions after discharge, call DR. Laylamarie Meuser, [phone].  ACTIVITY  You may resume your regular activity, but move at a slower pace for the next 24 hours.   Take frequent rest periods for the next 24 hours.   Walking will help get rid of the air and reduce the bloated feeling in your belly (abdomen).   No driving for 24 hours (because of the medicine (anesthesia) used during the test).   You may shower.   Do not sign any important legal documents or operate any machinery for 24 hours (because of the anesthesia used during the test).    NUTRITION  Drink plenty of fluids.   You may resume your normal diet as instructed by your doctor.   Begin with a light meal and progress to your normal diet. Heavy or fried foods are harder to digest and may make you feel sick to your stomach (nauseated).   Avoid alcoholic beverages for 24 hours or as instructed.    MEDICATIONS  You may resume your normal medications.   WHAT YOU CAN EXPECT TODAY  Some feelings of bloating in the abdomen.   Passage of more gas than usual.   Spotting of blood in your stool or on the toilet paper  .  IF YOU HAD  POLYPS REMOVED DURING THE COLONOSCOPY:  Eat a soft diet IF YOU HAVE NAUSEA, BLOATING, ABDOMINAL PAIN, OR VOMITING.    FINDING OUT THE RESULTS OF YOUR TEST Not all test results are available during your visit. DR. Oneida Alar WILL CALL YOU WITHIN 14 DAYS OF YOUR PROCEDUE WITH YOUR RESULTS. Do not assume everything is normal if you have not heard from DR. Ashtian Villacis, CALL HER OFFICE AT [phone].  SEEK IMMEDIATE MEDICAL ATTENTION AND CALL THE OFFICE: [phone] IF:  You have more than a spotting of blood in your stool.   Your belly is swollen (abdominal distention).   You are nauseated or vomiting.   You have a temperature over 101F.   You have abdominal pain or discomfort that is severe or gets worse throughout the day.   High-Fiber Diet A high-fiber diet changes your normal diet to include more whole grains, legumes, fruits, and vegetables. Changes in the diet involve replacing refined carbohydrates with unrefined foods. The calorie level of the diet is essentially unchanged. The Dietary Reference Intake (recommended amount) for adult males is 38 grams per day. For adult females, it is 25 grams per day. Pregnant and lactating women should consume 28 grams of fiber per day. Fiber is the intact part of a plant that is not broken down during digestion. Functional fiber is fiber that has been isolated from the plant to provide a beneficial effect in the body.  PURPOSE  Increase stool bulk.   Ease and regulate bowel movements.   Lower cholesterol.   REDUCE RISK OF COLON CANCER  INDICATIONS THAT YOU NEED MORE FIBER  Constipation and hemorrhoids.   Uncomplicated diverticulosis (intestine condition) and irritable bowel syndrome.   Weight management.   As a protective measure against hardening of the arteries (atherosclerosis), diabetes, and cancer.   GUIDELINES FOR INCREASING FIBER IN THE DIET  Start adding fiber to the diet slowly. A gradual increase of about 5 more grams (2 slices  of whole-wheat bread, 2 servings of most fruits or vegetables, or 1 bowl of high-fiber cereal) per day is best. Too rapid an increase in fiber may result in constipation, flatulence, and bloating.   Drink enough water and fluids to keep your urine clear or pale yellow. Water, juice, or caffeine-free drinks are recommended. Not drinking enough fluid may cause constipation.   Eat a variety of high-fiber foods rather than one type of fiber.   Try to increase your intake of fiber through using high-fiber foods rather than fiber pills or supplements that contain small amounts of fiber.   The goal is to change the types of food eaten. Do not supplement your present diet with high-fiber foods, but replace foods in your present diet.   INCLUDE A VARIETY OF FIBER SOURCES  Replace refined and processed grains with whole grains, canned fruits with fresh fruits, and incorporate other fiber sources. White rice, white breads, and most bakery goods contain little or no fiber.   Brown whole-grain rice, buckwheat oats, and many fruits and vegetables are all good sources of fiber. These include: broccoli, Brussels sprouts, cabbage, cauliflower, beets, sweet potatoes, white potatoes (skin on), carrots, tomatoes, eggplant, squash, berries, fresh fruits, and dried fruits.   Cereals appear to be the richest source of fiber. Cereal fiber is found in whole grains and bran. Bran is the fiber-rich outer coat of cereal grain, which is largely removed in refining. In whole-grain cereals, the bran remains. In breakfast cereals, the largest amount of fiber is found in those with "bran" in their names. The fiber content is sometimes indicated on the label.   You may need to include additional fruits and vegetables each day.   In baking, for 1 cup white flour, you may use the following substitutions:   1 cup whole-wheat flour minus 2 tablespoons.   1/2 cup white flour plus 1/2 cup whole-wheat flour.   Polyps, Colon  A  polyp is extra tissue that grows inside your body. Colon polyps grow in the large intestine. The large intestine, also called the colon, is part of your digestive system. It is a long, hollow tube at the end of your digestive tract where your body makes and stores stool. Most polyps are not dangerous. They are benign. This means they are not cancerous. But over time, some types of polyps can turn into cancer. Polyps that are smaller than a pea are usually not harmful. But larger polyps could someday become or may already be cancerous. To be safe, doctors remove all polyps and test them.   PREVENTION There is not one sure way to prevent polyps. You might be able to lower your risk of getting them if you:  Eat more fruits and vegetables and less fatty food.   Do not smoke.   Avoid alcohol.   Exercise every day.   Lose weight if you are overweight.   Eating more calcium and folate can also lower your risk of  getting polyps. Some foods that are rich in calcium are milk, cheese, and broccoli. Some foods that are rich in folate are chickpeas, kidney beans, and spinach.   Hemorrhoids Hemorrhoids are dilated (enlarged) veins around the rectum. Sometimes clots will form in the veins. This makes them swollen and painful. These are called thrombosed hemorrhoids. Causes of hemorrhoids include:  Constipation.   Straining to have a bowel movement.   HEAVY LIFTING  HOME CARE INSTRUCTIONS  Eat a well balanced diet and drink 6 to 8 glasses of water every day to avoid constipation. You may also use a bulk laxative.   Avoid straining to have bowel movements.   Keep anal area dry and clean.   Do not use a donut shaped pillow or sit on the toilet for long periods. This increases blood pooling and pain.   Move your bowels when your body has the urge; this will require less straining and will decrease pain and pressure.

## 2015-12-13 NOTE — Op Note (Addendum)
[facility] Patient Name: Ellen Bowers Procedure Date: 12/13/2015 8:45 AM MRN: [id number] Date of Birth: Jan 30, 1945 Attending MD: Ellen Bowers , MD CSN: [id number] Age: 70 Admit Type: Outpatient Procedure:                Colonoscopy WITH COLD FORCEPS/SNARE POLYPECTOMY Indications:              Screening in patient at increased risk: Family                            history of 1st-degree relative with colorectal                            cancer, High risk Bowers cancer surveillance:                            Personal history of colonic polyps. SUPREP CAUSED                            NAUSEA/VOMITING AND CHILLS. Providers:                Ellen Drain, MD, Ellen Del, RN, Ellen Bowers,                            Technician Referring MD:             Ellen Canada Manuella Ghazi, MD Medicines:                Fentanyl 150 micrograms IV, Midazolam 9 mg IV,                            Promethazine 33.5 mg IV Complications:            No immediate complications. Estimated Blood Loss:     Estimated blood loss was minimal. Procedure:                Pre-Anesthesia Assessment:                           - Prior to the procedure, a History and Physical                            was performed, and patient medications and                            allergies were reviewed. The patient's tolerance of                            previous anesthesia was also reviewed. The risks                            and benefits of the procedure and the sedation                            options and risks were discussed with the patient.  All questions were answered, and informed consent                            was obtained. Prior Anticoagulants: The patient has                            taken aspirin. ASA Grade Assessment: II - A patient                            with mild systemic disease. After reviewing the                            risks and benefits, the patient was deemed in                             satisfactory condition to undergo the procedure.                            After obtaining informed consent, the colonoscope                            was passed under direct vision. Throughout the                            procedure, the patient's blood pressure, pulse, and                            oxygen saturations were monitored continuously. The                            EC-[id number] ([id number]) scope was introduced through                            the anus and advanced to the the cecum, identified                            by appendiceal orifice and ileocecal valve. The                            ileocecal valve, appendiceal orifice, and rectum                            were photographed. The colonoscopy was extremely                            difficult due to significant looping. Successful                            completion of the procedure was aided by increasing                            the dose of sedation medication, changing the  patient to a supine position, using manual                            pressure, straightening and shortening the scope to                            obtain bowel loop reduction and COLOWRAP. The                            patient tolerated the procedure fairly well. The                            quality of the bowel preparation was excellent. Scope In: 9:08:05 AM Scope Out: 9:42:45 AM Scope Withdrawal Time: 0 hours 23 minutes 33 seconds  Total Procedure Duration: 0 hours 34 minutes 40 seconds  Findings:      The digital rectal exam findings include rectal tenderness and       non-thrombosed external hemorrhoids.      Two sessile polyps were found in the descending Bowers. The polyps were 4       to 5 mm in size. These polyps were removed with a hot snare. Resection       and retrieval were complete.      Three sessile polyps were found in the rectum and descending Bowers. The       polyps  were 2 to 3 mm in size. These polyps were removed with a cold       biopsy forceps. Resection and retrieval were complete.      Internal hemorrhoids were found. The hemorrhoids were moderate. Impression:               - Rectal tenderness and non-thrombosed external                            hemorrhoids found on digital rectal exam.                           - FIVE COLORECTAL POLYPS REMOVED                           - Internal hemorrhoids. Moderate Sedation:      Moderate (conscious) sedation was administered by the endoscopy nurse       and supervised by the endoscopist. The following parameters were       monitored: oxygen saturation, heart rate, blood pressure, and response       to care. Total physician intraservice time was 51 minutes. Recommendation:           - High fiber diet.                           - Continue present medications.                           - Await pathology results.                           - Repeat colonoscopy in 3 - 5 years for  surveillance WITH PROPOFOL AND COLOWRAP and                            Ellen Bowers.                           - Patient has a contact number available for                            emergencies. The signs and symptoms of potential                            delayed complications were discussed with the                            patient. Return to normal activities tomorrow.                            Written discharge instructions were provided to the                            patient. Procedure Code(s):        --- Professional ---                           [id number], Colonoscopy, flexible; with removal of                            tumor(s), polyp(s), or other lesion(s) by snare                            technique                           45380, 59, Colonoscopy, flexible; with biopsy,                            single or multiple                           99152, Moderate sedation services provided by the                             same physician or other qualified health care                            professional performing the diagnostic or                            therapeutic service that the sedation supports,                            requiring the presence of an independent trained                            observer to assist in the monitoring of the  patient's level of consciousness and physiological                            status; initial 15 minutes of intraservice time,                            patient age 73 years or older                           [phone], Moderate sedation services; each additional                            15 minutes intraservice time                           99153, Moderate sedation services; each additional                            15 minutes intraservice time Diagnosis Code(s):        --- Professional ---                           Z80.0, Family history of malignant neoplasm of                            digestive organs                           Z86.010, Personal history of colonic polyps                           D12.4, Benign neoplasm of descending Bowers                           K62.89, Other specified diseases of anus and rectum                           K64.4, Residual hemorrhoidal skin tags                           K64.8, Other hemorrhoids CPT copyright 2016 [employer]. All rights reserved. The codes documented in this report are preliminary and upon coder review may  be revised to meet current compliance requirements. Ellen Drain, MD Ellen Drain, MD 12/13/2015 10:04:34 AM This report has been signed electronically. Number of Addenda: 0

## 2015-12-13 NOTE — H&P (Addendum)
Primary Care Physician:  [name], MD Primary Gastroenterologist:  Dr. Oneida Alar  Pre-Procedure History & Physical: HPI:  Ellen Bowers is a 70 y.o. female here for  PERSONAL HISTORY OF POLYPS/FAMILY HISTORY: COLON CANCER. SUPREP CAUSED NV AND CHILLS.  Past Medical History:  Diagnosis Date  . Arthritis   . Asthma   . COPD (chronic obstructive pulmonary disease) ([place])   . GERD (gastroesophageal reflux disease)   . Helicobacter pylori gastritis 2005   treated  . Von Willebrand disease ([place]) 1996    Past Surgical History:  Procedure Laterality Date  . CATARACT EXTRACTION     double  . COLONOSCOPY N/A 05/30/2012   Dr.Mann:Multiple polyps removed from the right colon-see description above; otherwise normal colonoscopy up to the cecum. 5 polyps, multiple tubular adenomas. next tcs 05/2015  . ESOPHAGOGASTRODUODENOSCOPY  2012   Dr. Collene Mares: few erosions in duodenal bulb. BRAVO off PPI ("severe reflux")  . ESOPHAGOGASTRODUODENOSCOPY N/A 06/12/2014   SLF: 1. The mucosa of the esophagus appeared normal 2. Mild non-erosive gastrtitis.   [name] FLEXIBLE SIGMOIDOSCOPY N/A 02/05/2014   Dr. Oneida Alar: 5, 2-3 mm sessile sigmoid colon polyps removed, small internal hemorrhoids, moderate external hemorrhoids, 2 anal fissures present. Polyps were hyperplastic.   [name] NECK SURGERY  1951   cyst removal  . NECK SURGERY  1998   growth removed  . TONSILLECTOMY  1951  . TUBAL LIGATION  1984    Prior to Admission medications   Medication Sig Start Date End Date Taking? Authorizing Provider  acetaminophen (TYLENOL) 500 MG tablet Take 1,000 mg by mouth 2 (two) times daily as needed for moderate pain or headache.   Yes Historical Provider, MD  albuterol (PROVENTIL HFA;VENTOLIN HFA) 108 (90 BASE) MCG/ACT inhaler Inhale 2 puffs into the lungs every 6 (six) hours as needed for wheezing or shortness of breath.   Yes Historical Provider, MD  Ascorbic Acid (VITAMIN C) 1000 MG tablet Take 1,000 mg by mouth daily.   Yes  Historical Provider, MD  aspirin 81 MG tablet Take 81 mg by mouth daily.   Yes Historical Provider, MD  azelastine (ASTELIN) 0.1 % nasal spray Place 2 sprays into both nostrils daily as needed for rhinitis. Use in each nostril as directed   Yes Historical Provider, MD  b complex vitamins tablet Take 1 tablet by mouth daily.   Yes Historical Provider, MD  budesonide (PULMICORT) 0.5 MG/2ML nebulizer solution Take 0.5 mg by nebulization at bedtime.    Yes Historical Provider, MD  buPROPion (WELLBUTRIN SR) 200 MG 12 hr tablet Take 200 mg by mouth 2 (two) times daily. 09/02/15  Yes Historical Provider, MD  CALCIUM-MAGNESIUM PO Take 1 tablet by mouth every evening.   Yes Historical Provider, MD  celecoxib (CELEBREX) 200 MG capsule Take 200 mg by mouth at bedtime.    Yes Historical Provider, MD  cetirizine (ZYRTEC) 10 MG tablet Take 10 mg by mouth daily.   Yes Historical Provider, MD  Cholecalciferol (VITAMIN D3) 5000 units CAPS Take 5,000 Units by mouth daily.   Yes Historical Provider, MD  dexlansoprazole (DEXILANT) 60 MG capsule Take 1 capsule (60 mg total) by mouth 2 (two) times daily. Patient taking differently: Take 60 mg by mouth daily.  12/28/13  Yes Danie Binder, MD  diclofenac sodium (VOLTAREN) 1 % GEL Apply 1 application topically daily as needed for muscle pain. 09/19/15  Yes Historical Provider, MD  docusate sodium (COLACE) 100 MG capsule Take 100 mg by mouth 2 (two) times daily.  Yes Historical Provider, MD  doxylamine, Sleep, (UNISOM) 25 MG tablet Take 12.5 mg by mouth at bedtime.   Yes Historical Provider, MD  fluticasone (FLONASE) 50 MCG/ACT nasal spray Place 2 sprays into both nostrils daily as needed for allergies or rhinitis.   Yes Historical Provider, MD  Fluticasone-Salmeterol (ADVAIR) 500-50 MCG/DOSE AEPB Inhale 1 puff into the lungs daily.   Yes Historical Provider, MD  HYDROcodone-homatropine (HYCODAN) 5-1.5 MG/5ML syrup Take 5 mLs by mouth every 6 (six) hours as needed for cough.    Yes Historical Provider, MD  ipratropium-albuterol (DUONEB) 0.5-2.5 (3) MG/3ML SOLN Take 3 mLs by nebulization every 6 (six) hours as needed (shortness of breath).   Yes Historical Provider, MD  montelukast (SINGULAIR) 10 MG tablet Take 10 mg by mouth daily.    Yes Historical Provider, MD  Na Sulfate-K Sulfate-Mg Sulf (SUPREP BOWEL PREP KIT) 17.5-3.13-1.6 GM/180ML SOLN Take 1 kit by mouth as directed. 11/13/15  Yes Danie Binder, MD  OVER THE COUNTER MEDICATION Take 1 tablet by mouth at bedtime. "midnight" dietary supplement   Yes Historical Provider, MD  pravastatin (PRAVACHOL) 20 MG tablet Take 20 mg by mouth at bedtime.    Yes Historical Provider, MD  solifenacin (VESICARE) 5 MG tablet Take 5 mg by mouth every evening.    Yes Historical Provider, MD  Tetrahydrozoline-Zn Sulfate (ALLERGY RELIEF EYE DROPS OP) Apply 1 drop to eye daily as needed (allergies).   Yes Historical Provider, MD  alum & mag hydroxide-simeth (MAALOX/MYLANTA) 200-200-20 MG/5ML suspension Take 15 mLs by mouth as needed for indigestion or heartburn.    Historical Provider, MD    Allergies as of 11/13/2015 - Review Complete 11/13/2015  Allergen Reaction Noted  . Demerol [meperidine] Nausea And Vomiting 06/12/2014  . Betadine [povidone iodine] Rash 05/30/2012    Family History  Problem Relation Age of Onset  . Colon cancer Mother     less than age 105  . Colon cancer Father     more than age 45, died from metastatic disease at age 47  . Liver disease Neg Hx     Social History   Social History  . Marital status: Married    Spouse name: N/A  . Number of children: N/A  . Years of education: N/A   Occupational History  . Not on file.   Social History Main Topics  . Smoking status: Former Smoker    Packs/day: 1.00    Years: 50.00    Types: Cigarettes    Quit date: 05/30/2009  . Smokeless tobacco: Never Used     Comment: Quit smoking x 5 years  . Alcohol use Yes     Comment: occasionally    . Drug use: No   . Sexual activity: Not on file   Other Topics Concern  . Not on file   Social History Narrative  . No narrative on file    Review of Systems: See HPI, otherwise negative ROS   Physical Exam: BP (!) 114/99   Pulse 80   Temp 98.5 F (36.9 C) (Oral)   Resp 19   Ht _0  (1.753 m)   Wt 200 lb (90.7 kg)   SpO2 100%   BMI 29.53 kg/m  General:   Alert,  pleasant and cooperative in NAD Head:  Normocephalic and atraumatic. Neck:  Supple; Lungs:  Clear throughout to auscultation.    Heart:  Regular rate and rhythm. Abdomen:  Soft, nontender and nondistended. Normal bowel sounds, without guarding, and without rebound.  Neurologic:  Alert and  oriented x4;  grossly normal neurologically.  Impression/Plan:     PERSONAL HISTORY OF POLYPS.  PLAN: 1. TCS TODAY. DISCUSSED PROCEDURE, BENEFITS, & RISKS: < 1% chance of medication reaction, bleeding, perforation, or rupture of spleen/liver.

## 2015-12-17 ENCOUNTER — Telehealth: Payer: Self-pay

## 2015-12-17 NOTE — Telephone Encounter (Signed)
Received a fax from the pharmacy. [employer] needed a PA. I did the PA for it and it was denied by the insurance. The Insurance said it was not a covered medication and it would either need to be changed or an appeal letter can be done.

## 2015-12-18 NOTE — Telephone Encounter (Signed)
PLEASE CALL insurance and ask what is covered.

## 2015-12-20 ENCOUNTER — Encounter (HOSPITAL_COMMUNITY): Payer: Self-pay | Admitting: Gastroenterology

## 2015-12-23 NOTE — Telephone Encounter (Signed)
Pt does not have part D- (pharmacy coverage)

## 2015-12-24 NOTE — Telephone Encounter (Signed)
Pt is aware. She said she has an Rx card just for prescriptions. She is going to take it to the pharmacy and make sure they have the correct information and will call back and let us know.

## 2015-12-24 NOTE — Telephone Encounter (Signed)
REVIEWED. PLEASE CALL PT AND LET HER KNOW.

## 2015-12-31 ENCOUNTER — Telehealth: Payer: Self-pay | Admitting: Gastroenterology

## 2015-12-31 ENCOUNTER — Encounter: Payer: Self-pay | Admitting: Pulmonary Disease

## 2015-12-31 ENCOUNTER — Ambulatory Visit (INDEPENDENT_AMBULATORY_CARE_PROVIDER_SITE_OTHER): Payer: Medicare Other | Admitting: Pulmonary Disease

## 2015-12-31 ENCOUNTER — Ambulatory Visit (INDEPENDENT_AMBULATORY_CARE_PROVIDER_SITE_OTHER)
Admission: RE | Admit: 2015-12-31 | Discharge: 2015-12-31 | Disposition: A | Discharge status: Home / Self Care | Payer: Medicare Other | Source: Ambulatory Visit | Attending: Pulmonary Disease | Admitting: Pulmonary Disease

## 2015-12-31 VITALS — BP 122/80 | HR 85 | Temp 98.0°F | Ht 69.0 in | Wt 200.5 lb

## 2015-12-31 DIAGNOSIS — K219 Gastro-esophageal reflux disease without esophagitis: Secondary | ICD-10-CM

## 2015-12-31 DIAGNOSIS — R911 Solitary pulmonary nodule: Secondary | ICD-10-CM

## 2015-12-31 DIAGNOSIS — J449 Chronic obstructive pulmonary disease, unspecified: Secondary | ICD-10-CM | POA: Diagnosis not present

## 2015-12-31 DIAGNOSIS — R05 Cough: Secondary | ICD-10-CM

## 2015-12-31 DIAGNOSIS — R053 Chronic cough: Secondary | ICD-10-CM

## 2015-12-31 DIAGNOSIS — Z8701 Personal history of pneumonia (recurrent): Secondary | ICD-10-CM

## 2015-12-31 MED ORDER — AZITHROMYCIN 250 MG PO TABS: ORAL_TABLET | ORAL | 2 refills | Status: DC

## 2015-12-31 MED ORDER — HYDROCODONE-HOMATROPINE 5-1.5 MG/5ML PO SYRP: 5.0000 mL | ORAL_SOLUTION | Freq: Four times a day (QID) | ORAL | 0 refills | Status: DC | PRN

## 2015-12-31 MED ORDER — METHYLPREDNISOLONE 4 MG PO TBPK: ORAL_TABLET | ORAL | 0 refills | Status: DC

## 2015-12-31 NOTE — Patient Instructions (Signed)
Today we updated your med list in our EPIC system...    Continue your current medications the same...  For your COPD & upper resp infection>>    We have prescribed a ZPak and a MEDROL Dosepak for the infection & inflammation       (this is our best option for your up-coming travel plans)  We refilled your HYCODAN cough syrup as well...  Today we checked a CXR.[name]    We will contact you w/ the results when available...   Call for any questions...  Let's plan a follow up visit in 72mo sooner if needed for acute problems..Marland KitchenMarland Kitchen

## 2015-12-31 NOTE — Progress Notes (Signed)
Subjective:     Patient ID: Ellen Bowers, female   DOB: Feb 19, 1945, 70 y.o.   MRN: [id number]  HPI ~  Initial consult 06/07/14 by SN >>       52 y/o WF, retired Merchandiser, retail, referred by [facility] in [place] for pulmonary evaluation> She presented to her PCP w/ 3wk hx productive cough on 05/08/14 assoc w/ PND, hoarseness, HA & some wheezing;  She is an ex-smoker w/ a hx COPD- centribolular emphysema and scattered sub-centimeter pulm nodules seen on CT Jan2015;  Prev PFTs revealed mod airflow obstruction w/ FEV1=2.10 (78%) and %1sec=63;  She has been on treatment from [place] w/ ADVAIR250-Bid, Albut rescue inhaler prn, NEB w/ Duoneb prn, Singulair10...       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, retail and an ex-smoker- having started smoking in her teens and smoked for ~59yr up to 2ppd transiently (1ppd was ave), and quit in 2011 w/ the help of [employer];  She denies resp symptoms while she was smoking- no cough/ sput/ hemoptysis or SOB; she notes that she'd get bronchitis once or twice a yr, and she was told about "asthma" many yrs ago;  When pressed she notes current symptoms had their onset ~164yrgo w/ progressive cough & says it's been worse ever since [facility] (treated on return w/ shot, Antibiotic & Prednisone; she is really in denial about her COPD- wondering if her symptoms are due to allergies (but present all yr), and wonders if it's reflux related (but not worse qhs), and says she wants to be sure it's not something else... Current symptoms include> SOB/DOE w/ mild activities like stairs, walking on incline, or if rushed but denies prob w/ ADLs etc;  She notes cough, now mostly dry, occas clear sput, no hempotysis;  She has no known occupational exposures and NEG FamHx for lung dis...       EXAM reveals Afeb, VSS, O2sat=94% on RA;  HEENT- neg, mallampati2;  Chest- decrBS bilat, few scat rhonchi at bases, no w/r/ consolidation;  Heart- RR w/o m/r/g;  Abd- soft/ neg;  Ext- w/o c/c/e;   Neuro- intact w/o focal abn...  [place], great vessels, & coronaries, mild diffuse bronchial wall thickening & centrilobular emphysema, several scattered nonspecific subcentimeter pulm nodules (largest=20m42mn medial RLL), no adenopathy; (Note> f/u CT was rec in 6-36m8morecheck these nodules)  CT Chest [place]/ contrast [place], centrilobular emphysema in upper lobes bilat, stable 20mm 1m nodule, no pathologically enlarged LNs...   CXR 01[place]21/14 [place] norm heart size, COPD & patchy interstitial prominence on right esp RUL concerning for multilobar pneumonia  PFTs [place]20/14 [place] FVC=3.30 (96%), FEV1=2.10 (78%), %1sec=63, mid-flows reduced at 31% predicted; Lung Volumes showed TLC=5.81 (100%), RV=2.51 (108%), RV/TLC=43%; DLCO=63% predicted but corrected to 109% with alveolar ventilation correction... This is c/w moderate airflow obstruction and GOLD Stage 2 COPD...   [place]104 [place]- wnl (4%eos-300absolute);  Chems- wnl;  TSH=4.58  She is due for f/u CXR & CT Chest 6/16> [facility]> ordered & pending ==>              CXR 6/16 showed norm heart size, underlying COPD/hyperinflation, scarring in apicies, otherw clear/ wnl/ NAD...   Marland KitchenMarland Kitchen        CT Chest 6/16 showed norm heart size, arteriosclerotic changes  in Ao, branch vessels, & coronaries; lungs show emphysematous changes & prev 48m nodule in RLL is smaller & in an area of scarring, no adenopathy...   Additional LABS- IgE, RAST panel, A1AT level> 05/2014==> IgE=19, RAST panel all neg x +Candida;  A1AT=141 w/ phenotype MM.                      CXR 06/13/14                                           CT Chest 06/13/14     IMP/PLAN>>  RHumnais a recent ex-smoker w/ a signif past smoking hx; she has underlying COPD/ Emphysema and a chronic cough that has been hard to shake since the pneumonia in Jan2016;   I have recommended a tapering course of oral PREDNISONE along w/ her ADVAIR250, DUONEBS Tid, Singulair10, and Hycodan as needed... She has f/u CXR, CT Chest, & some additional labs ordered & pending (see above)...    ~  July 10, 2014:  177moOV w/ SN >> RoAquilatates that she's doing a little bit better, but she attributes the improvement to OTC Tylenol cold & sinus relief "it works better that [employer]  She continues to work as a hoMerchandiser, retail.     COPD/emphysema, ex-smoker, pulm nodule, recurrent bronchitic infections, hx pneumonia> we treated her w/ a Prednisone taper (down to 1/2 tab Qod til gone), Advair250-Bid, Duoneb Tid, Singulair10, Hycodan prn; f/u scan showed the RLL nodule appears smaller, she feels that OTC Tylenol cold&sinus helped more, using Advair250Bid, using the NEB just once/d Qhs, on Singulair10/d & AlbutHFA prn (ave 2-3x per wk)...    Atherosclerosis seen on CT scans> on ASA81 & has NTG for prn use from her PCP...    Hyperchol> on Prav20 from her PCP    GERD, hx HPylori pos gastritis, hx colon polyps> [place] Dexilant[place], Maalox, Carafate,,,    Hx Von Willebrand's dis> details unknown, no data avail in Epic... We reviewed prob list, meds, xrays and labs>   PLAN>>  Ellen Bowers is stable w/ her mod airflow obstruction from mixed COPD/chr bronchitis & emphysema; she is way too sedentary & needs to start a formal exercise program- rec the Y, silver sneakers, etc;  Continue current meds but use the NEBS-Bid (eg- lunch & bedtime), Advair250Bid (eg- breakfast & dinner), plus the singulair & her Tylenol cold & sinus;  She will wean off the Pred & f/u in 4-63m67mo  ~  December 27, 2014:  4-18mo28mo & Ellen Bowers that her breathing is about the same & notes some cough/ white mucous/ SOB & DOE w/o change;  [name] she tells me that he has changed her meds- taking ADVAIR500Bid, NEBULIZER w/ Budes Bid (but only using it at  night) & Duoneb Qid prn, plus Singulair10;  Previously we had recommended Advair250Bid at breakfast & dinner, plus the Duoneb bid at lunch & bedtime; she does not want to take Prednisone...     COPD/emphysema, ex-smoker, pulm nodule, recurrent bronchitic infections, hx pneumonia> we treated her w/ a Prednisone taper (down to 1/2 tab Qod til gone), Advair250-Bid, Duoneb Tid, Singulair10, Hycodan prn; f/u scan showed the RLL nodule appears smaller, she feels that OTC Tylenol cold&sinus helped more; [name]  Advair500Bid + NEBS w/ Budes Bid & Duoneb Qid prn...    Atherosclerosis seen on CT scans> on ASA81 & has NTG for prn use from her PCP...    Hyperchol> on Prav20 from her PCP    GERD, hx HPylori pos gastritis, hx colon polyps> followed by DrSFields in [place] Dexilant60, Maalox, Carafate,,,    Hx Von Willebrand's dis> details unknown, no data avail in Epic... EXAM reveals Afeb, VSS, O2sat=96% on RA;  HEENT- neg, mallampati2;  Chest- decrBS bilat, few scat rhonchi at bases, no w/r/ consolidation;  Heart- RR w/o m/r/g;  Abd- soft/ neg;  Ext- w/o c/c/e;  Neuro- intact w/o focal abn... IMP/PLAN>>  We discussed her meds & REC ADVAIR500Bid, NEBS w/ Duoneb & Budes Bid, Singulair10, Mucinex1200Bid, fluids, etc; we reviewed Pred tapering sched;  ROV in 91mo sooner prn...   ~  June 27, 2015:  689moOV w/ SN>  Ro[place]ola[place] mixed COPD- chr bronchitis & emphysema w/ poor medication compliance- on Advair250 (only using Qam), NEBS w/ Duoneb Q6H prn (ave one per wk) & Budes0.5 (only using this Qhs);  She is off Pred, uses Singulair10/d, Zyrtek10/d, Hycodan prn & ProairHFA prn (ave 1-2x per wk); she uses Tramadol50 prn cough... She has an abn CT Chest w/ 54m70mLL nodule- sl smaller on last scan 06/2014;  She quit smoking in 2011... She notes that the hot humid weather is a prob for her w/ wheezing when outside, otherw she feels that she is doing satis- denies much cough/ sput, no  hemoptysis, no CP, no f/c/s; notes chr stable SOB w/ walking, hills, etc; notes walking is a challenge w/ hx plantar faciitis => [facility]...     EXAM reveals Afeb, VSS, O2sat=96% on RA;  HEENT- neg, mallampati2;  Chest- decrBS bilat, few scat rhonchi at bases, no w/r/ consolidation;  Heart- RR w/o m/r/g;  Abd- soft/ neg;  Ext- w/o c/c/e;  Neuro- intact w/o focal abn... IMP/PLAN>>  Ellen Bowers stable, feels that her breathing is good & improving; she does not want to change her current med regimen => continue same & follow up 54mo1mo    Past Medical History  Diagnosis Date  . Asthma   . COPD (chronic obstructive pulmonary disease) >> she takes DEXILANT60Bid    . GERD (gastroesophageal reflux disease)   . Arthritis >> she says she cannot do w/o CELEBREX200Bid & Tramadol50 prn   . Von Willebrand disease 1996  . Helicobacter pylori gastritis  2005    Dysthymia >> on Wellbutrin150Bid & EffexorER150    Past Surgical History:  Procedure Laterality Date  . CATARACT EXTRACTION     double  . COLONOSCOPY N/A 05/30/2012   Dr.Mann:Multiple polyps removed from the right colon-see description above; otherwise normal colonoscopy up to the cecum. 5 polyps, multiple tubular adenomas. next tcs 05/2015  . COLONOSCOPY N/A 12/13/2015   Procedure: COLONOSCOPY;  Surgeon: SandDanie Binder;  [facility];  Service: Endoscopy;  Laterality: N/A;  8:30 am  . ESOPHAGOGASTRODUODENOSCOPY  2012   Dr. MAnnCollene Maresw erosions in duodenal bulb. BRAVO off PPI ("severe reflux")  . ESOPHAGOGASTRODUODENOSCOPY N/A 06/12/2014   SLF: 1. The mucosa of the esophagus appeared normal 2. Mild non-erosive gastrtitis.   . FLMarland KitchenXIBLE SIGMOIDOSCOPY N/A 02/05/2014   Dr. fielOneida Alar 2-3 mm sessile sigmoid colon polyps removed, small internal hemorrhoids, moderate external hemorrhoids, 2 anal fissures present. Polyps were hyperplastic.   . [place]  cyst removal  . NECK SURGERY  1998    growth removed  . TONSILLECTOMY  1951  . TUBAL LIGATION  1984    Outpatient Encounter Prescriptions as of 12/27/2014  Medication Sig  . albuterol (PROVENTIL HFA;VENTOLIN HFA) 108 (90 BASE) MCG/ACT inhaler Inhale 2 puffs into the lungs every 6 (six) hours as needed for wheezing or shortness of breath.  [name] alum & mag hydroxide-simeth (MAALOX/MYLANTA) 200-200-20 MG/5ML suspension Take 15 mLs by mouth as needed for indigestion or heartburn.  . Ascorbic Acid (VITAMIN C) 1000 MG tablet Take 1,000 mg by mouth daily.  [name] aspirin 81 MG tablet Take 81 mg by mouth daily.  . budesonide (PULMICORT) 0.5 MG/2ML nebulizer solution Take 0.5 mg by nebulization 2 (two) times daily.  [name] buPROPion (WELLBUTRIN SR) 150 MG 12 hr tablet Take 150 mg by mouth 2 (two) times daily.  . celecoxib (CELEBREX) 200 MG capsule Take 200 mg by mouth daily.   . cetirizine (ZYRTEC) 10 MG tablet Take 10 mg by mouth daily.  [name] dexlansoprazole (DEXILANT) 60 MG capsule Take 1 capsule (60 mg total) by mouth 2 (two) times daily. (Patient taking differently: Take 60 mg by mouth daily. )  . docusate sodium (COLACE) 100 MG capsule Take 100 mg by mouth 2 (two) times daily.  . Fluticasone-Salmeterol (ADVAIR) 250-50 MCG/DOSE AEPB Inhale 1 puff into the lungs every 12 (twelve) hours.  [name] HYDROcodone-homatropine (HYCODAN) 5-1.5 MG/5ML syrup Take 5 mLs by mouth every 6 (six) hours as needed for cough.  . hydrocortisone (PROCTOSOL HC) 2.5 % rectal cream Place 1 application rectally 2 (two) times daily. (Patient taking differently: Place 1 application rectally 2 (two) times daily as needed. )  . ipratropium-albuterol (DUONEB) 0.5-2.5 (3) MG/3ML SOLN Take 3 mLs by nebulization every 6 (six) hours as needed (shortness of breath).  . montelukast (SINGULAIR) 10 MG tablet Take 10 mg by mouth daily.   . Nitroglycerin 0.4 % OINT Place 1 drop rectally 3 (three) times daily. FOR 21 DAYS (Patient taking differently: Place 1 drop rectally 3 (three) times daily as  needed. FOR 21 DAYS)  . pravastatin (PRAVACHOL) 20 MG tablet Take 20 mg by mouth daily.  . solifenacin (VESICARE) 5 MG tablet Take 5 mg by mouth daily.  . traMADol (ULTRAM) 50 MG tablet Take by mouth 2 (two) times daily.    Allergies  Allergen Reactions  . Demerol [Meperidine] Nausea And Vomiting  . Betadine [Povidone Iodine] Rash    Topical only    Immunization History  Administered Date(s) Administered  . Influenza,inj,Quad PF,36+ Mos 10/22/2014  . Influenza-Unspecified 11/06/2013  . Pneumococcal Conjugate-13 11/26/2014  . Pneumococcal Polysaccharide-23 10/07/2012    Current Medications, Allergies, Past Medical History, Past Surgical History, Family History, and Social History were reviewed in [employer] record.   Review of Systems        All symptoms NEG except where BOLDED >>  Constitutional:  F/C/S, fatigue, anorexia, unexpected weight change. HEENT:  HA, visual changes, hearing loss, earache, nasal symptoms, sore throat, mouth sores, hoarseness. Resp:  cough, sputum, hemoptysis; SOB, tightness, wheezing. Cardio:  CP, palpit, DOE, orthopnea, edema. GI:  N/V/D/C, blood in stool; reflux, abd pain, distention, gas. GU:  dysuria, freq, urgency, hematuria, flank pain, voiding difficulty. MS:  joint pain, swelling, tenderness, decr ROM; neck pain, back pain, etc. Neuro:  HA, tremors, seizures, dizziness, syncope, weakness, numbness, gait abn. Skin:  suspicious lesions or skin rash. Heme:  adenopathy, bruising, bleeding. Psyche:  confusion, agitation, sleep disturbance,  hallucinations, anxiety, depression suicidal.   Objective:   Physical Exam      Vital Signs:  Reviewed...   General:  WD, overweight, 70 y/o WF in NAD; alert & oriented; pleasant & cooperative... HEENT:  [place]/AT; Conjunctiva- pink, Sclera- nonicteric, EOM-wnl, PERRLA, EACs-clear, TMs-wnl; NOSE-sl red; THROAT-clear & wnl, mallampati2.  Neck:  Supple w/ fairROM; no JVD; normal carotid  impulses w/o bruits; no thyromegaly or nodules palpated; no lymphadenopathy.  Chest:  Sl decr BS at bases, scar rhonchi, w/o w/r/consolidation... Heart:  Regular Rhythm; gr1/6SEM, w/o rubs or gallops detected. Abdomen:  Soft & nontender- no guarding or rebound; normal bowel sounds; no organomegaly or masses palpated. Ext:  Sl decr ROM; without deformities mild arthritic changes; no varicose veins, +venous insuffic, tr edema;  Pulses intact w/o bruits. Neuro:  CNs II-XII intact; motor testing normal; sensory testing normal; gait normal & balance OK. Derm:  No lesions noted; no rash etc. Lymph:  No cervical, supraclavicular, axillary, or inguinal adenopathy palpated.   Assessment:     IMP >>     Persistent cough after episode of pneumonia Jan2016 -- improved w/ OTC Tylenol cold & sinus she says.    COPD/ emphysema/ ex-smoker -- continue to taper off Pred, use NEB w/ Duoneb BID, contin Advair500Bid, continue Singulair10.    Several subcentimeter pulm nodules seen on CT Chest -- f/u scan showed this RLL nodule appears to be improved.    Hx recurrent bronchitic infe3ctions & hx pneumonia> no interval infectious exac reported.  PLAN >>  Zofia is stable w/ her mod airflow obstruction from mixed COPD/chr bronchitis & emphysema; she is way too sedentary & needs to start a formal exercise program- rec the Y, silver sneakers, etc;  Continue current meds but use the NEBS-Bid (eg- lunch & bedtime), Advair500Bid (eg- breakfast & dinner), plus the singulair & her Tylenol cold & sinus;  She will wean off the Pred & f/u in 4-30mo     Plan:     Patient's Medications  New Prescriptions   NA SULFATE-K SULFATE-MG SULF (SUPREP BOWEL PREP KIT) 17.5-3.13-1.6 GM/180ML SOLN    Take 1 kit by mouth as directed.   PRAMOXINE-HYDROCORTISONE (PROCTOCREAM-HC) 1-1 % RECTAL CREAM    USE PR 4 TIMES A DAY FOR 10 DAYS.  Previous Medications   ACETAMINOPHEN (TYLENOL) 500 MG TABLET    Take 1,000 mg by mouth 2 (two) times  daily as needed for moderate pain or headache.   ALBUTEROL (PROVENTIL HFA;VENTOLIN HFA) 108 (90 BASE) MCG/ACT INHALER    Inhale 2 puffs into the lungs every 6 (six) hours as needed for wheezing or shortness of breath.   ALUM & MAG HYDROXIDE-SIMETH (MAALOX/MYLANTA) 200-200-20 MG/5ML SUSPENSION    Take 15 mLs by mouth as needed for indigestion or heartburn.   ASCORBIC ACID (VITAMIN C) 1000 MG TABLET    Take 1,000 mg by mouth daily.   ASPIRIN 81 MG TABLET    Take 81 mg by mouth daily.   AZELASTINE (ASTELIN) 0.1 % NASAL SPRAY    Place 2 sprays into both nostrils daily as needed for rhinitis. Use in each nostril as directed   B COMPLEX VITAMINS TABLET    Take 1 tablet by mouth daily.   BUDESONIDE (PULMICORT) 0.5 MG/2ML NEBULIZER SOLUTION    Take 0.5 mg by nebulization at bedtime.    BUPROPION (WELLBUTRIN SR) 200 MG 12 HR TABLET    Take 200 mg by mouth 2 (two) times daily.   CALCIUM-MAGNESIUM PO  Take 1 tablet by mouth every evening.   CELECOXIB (CELEBREX) 200 MG CAPSULE    Take 200 mg by mouth at bedtime.    CETIRIZINE (ZYRTEC) 10 MG TABLET    Take 10 mg by mouth daily.   CHOLECALCIFEROL (VITAMIN D3) 5000 UNITS CAPS    Take 5,000 Units by mouth daily.   DEXLANSOPRAZOLE (DEXILANT) 60 MG CAPSULE    Take 1 capsule (60 mg total) by mouth 2 (two) times daily.   DICLOFENAC SODIUM (VOLTAREN) 1 % GEL    Apply 1 application topically daily as needed for muscle pain.   DOCUSATE SODIUM (COLACE) 100 MG CAPSULE    Take 100 mg by mouth 2 (two) times daily.   DOXYLAMINE, SLEEP, (UNISOM) 25 MG TABLET    Take 12.5 mg by mouth at bedtime.   FLUTICASONE (FLONASE) 50 MCG/ACT NASAL SPRAY    Place 2 sprays into both nostrils daily as needed for allergies or rhinitis.   FLUTICASONE-SALMETEROL (ADVAIR) 500-50 MCG/DOSE AEPB    Inhale 1 puff into the lungs daily.   HYDROCODONE-HOMATROPINE (HYCODAN) 5-1.5 MG/5ML SYRUP    Take 5 mLs by mouth every 6 (six) hours as needed for cough.   IPRATROPIUM-ALBUTEROL (DUONEB) 0.5-2.5 (3)  MG/3ML SOLN    Take 3 mLs by nebulization every 6 (six) hours as needed (shortness of breath).   OVER THE COUNTER MEDICATION    Take 1 tablet by mouth at bedtime. "midnight" dietary supplement   PRAVASTATIN (PRAVACHOL) 20 MG TABLET    Take 20 mg by mouth at bedtime.    SOLIFENACIN (VESICARE) 5 MG TABLET    Take 5 mg by mouth every evening.    TETRAHYDROZOLINE-ZN SULFATE (ALLERGY RELIEF EYE DROPS OP)    Apply 1 drop to eye daily as needed (allergies).  Modified Medications   No medications on file  Discontinued Medications   BUPROPION (WELLBUTRIN SR) 150 MG 12 HR TABLET    Take 150 mg by mouth 2 (two) times daily.   FLUTICASONE-SALMETEROL (ADVAIR) 250-50 MCG/DOSE AEPB    Inhale 1 puff into the lungs every 12 (twelve) hours.   HYDROCODONE-HOMATROPINE (HYCODAN) 5-1.5 MG/5ML SYRUP    Take 5 mLs by mouth every 6 (six) hours as needed for cough.   HYDROCORTISONE (PROCTOSOL HC) 2.5 % RECTAL CREAM    Place 1 application rectally 2 (two) times daily.   MONTELUKAST (SINGULAIR) 10 MG TABLET    Take 10 mg by mouth daily.    NITROGLYCERIN 0.4 % OINT    Place 1 drop rectally 3 (three) times daily. FOR 21 DAYS   PREDNISONE (DELTASONE) 20 MG TABLET    Take as directed.   TRAMADOL (ULTRAM) 50 MG TABLET    Take by mouth 2 (two) times daily.

## 2015-12-31 NOTE — Telephone Encounter (Addendum)
Please call pt. She had THREE simple adenomas AND TWO HYPERPLASTIC POLYPS removed.    CONTINUE YOUR WEIGHT LOSS EFFORTS. LOSE TEN POUNDS.  DRINK WATER TO KEEP YOUR URINE LIGHT YELLOW.  FOLLOW A HIGH FIBER DIET. AVOID ITEMS THAT CAUSE BLOATING & GAS. SEE INFO BELOW.  Next colonoscopy in 3 years W/ MAC.

## 2015-12-31 NOTE — Progress Notes (Signed)
Subjective:     Patient ID: Ellen Bowers, female   DOB: Feb 19, 1945, 70 y.o.   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, retail and an ex-smoker- having started smoking in Ellen Bowers teens and smoked for ~59yr up to 2ppd transiently (1ppd was ave), and quit in 2011 w/ the help of Chantix;  Ellen Bowers denies resp symptoms while Ellen Bowers was smoking- no cough/ sput/ hemoptysis or SOB; Ellen Bowers notes that Ellen Bowers'd get bronchitis once or twice a yr, and Ellen Bowers was told about "asthma" many yrs ago;  When pressed Ellen Bowers notes current symptoms had their onset ~164yrgo w/ progressive cough & says it's been worse ever since [facility] Bow[facility] (treated on return w/ shot, Antibiotic & Prednisone; Ellen Bowers is really in denial about Ellen Bowers COPD- wondering if Ellen Bowers symptoms are due to allergies (but present all yr), and wonders if it's reflux related (but not worse qhs), and says Ellen Bowers wants to be sure it's not something else... Current symptoms include> SOB/DOE w/ mild activities like stairs, walking on incline, or if rushed but denies prob w/ ADLs etc;  Ellen Bowers notes cough, now mostly dry, occas clear sput, no hempotysis;  Ellen Bowers has no known occupational exposures and NEG FamHx for lung dis...       EXAM reveals Afeb, VSS, O2sat=94% on RA;  HEENT- neg, mallampati2;  Chest- decrBS bilat, few scat rhonchi at bases, no w/r/ consolidation;  Heart- RR w/o m/r/g;  Abd- soft/ neg;  Ext- w/o c/c/e;   Neuro- intact w/o focal abn...  [place] of [place], great vessels, & coronaries, mild diffuse bronchial wall thickening & centrilobular emphysema, several scattered nonspecific subcentimeter pulm nodules (largest=20m42mn medial RLL), no adenopathy; (Note> f/u CT was rec in 6-36m8morecheck these nodules)  CT Chest [place]/ contrast [place], centrilobular emphysema in upper lobes bilat, stable 20mm 1m nodule, no pathologically enlarged LNs...   CXR 01/21/14 [place] norm heart size, COPD & patchy interstitial prominence on right esp RUL concerning for multilobar pneumonia  PFTs 02/20/14 [place] [facility] [place] FVC=3.30 (96%), FEV1=2.10 (78%), %1sec=63, mid-flows reduced at 31% predicted; Lung Volumes showed TLC=5.81 (100%), RV=2.51 (108%), RV/TLC=43%; DLCO=63% predicted but corrected to 109% with alveolar ventilation correction... This is c/w moderate airflow obstruction and GOLD Stage 2 COPD...   [place]104 [place] [facility] [place]- wnl (4%eos-300absolute);  Chems- wnl;  TSH=4.58  Ellen Bowers is due for f/u CXR & CT Chest 6/16> [facility]> ordered & pending ==>              CXR 6/16 showed norm heart size, underlying COPD/hyperinflation, scarring in apicies, otherw clear/ wnl/ NAD...   Marland KitchenMarland Kitchen        CT Chest 6/16 showed norm heart size, arteriosclerotic changes  in Ao, branch vessels, & coronaries; lungs show emphysematous changes & prev 48m nodule in RLL is smaller & in an area of scarring, no adenopathy...   Additional LABS- IgE, RAST panel, A1AT level> 05/2014==> IgE=19, RAST panel all neg x +Candida;  A1AT=141 w/ phenotype MM.                      CXR 06/13/14                                           CT Chest 06/13/14     IMP/PLAN>>  RHumnais a recent ex-smoker w/ a signif past smoking hx; Ellen Bowers has underlying COPD/ Emphysema and a chronic cough that has been hard to shake since the pneumonia in Jan2016;   I have recommended a tapering course of oral PREDNISONE along w/ Ellen Bowers ADVAIR250, DUONEBS Tid, Singulair10, and Hycodan as needed... Ellen Bowers has f/u CXR, CT Chest, & some additional labs ordered & pending (see above)...    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     COPD/emphysema, ex-smoker, pulm nodule, recurrent bronchitic infections, hx pneumonia> we treated Ellen Bowers w/ a Prednisone taper (down to 1/2 tab Qod til gone), Advair250-Bid, Duoneb Tid, Singulair10, Hycodan prn; f/u scan showed the RLL nodule appears smaller, Ellen Bowers feels that OTC Tylenol cold&sinus helped more, using Advair250Bid, using the NEB just once/d Qhs, on Singulair10/d & AlbutHFA prn (ave 2-3x per wk)...    Atherosclerosis seen on CT scans> on ASA81 & has NTG for prn use from Ellen Bowers...    Hyperchol> on Prav20 from Ellen Bowers    GERD, hx HPylori pos gastritis, hx colon polyps> [place] [facility] [place] Dexilant60, Maalox, Carafate,,,    Hx Von Willebrand's dis> details unknown, no data avail in Epic... We reviewed prob list, meds, xrays and labs>   PLAN>>  Ellen Bowers is stable w/ Ellen Bowers mod airflow obstruction from mixed COPD/chr bronchitis & emphysema; Ellen Bowers is way too sedentary & needs to start a formal exercise program- rec the Y, silver sneakers, etc;  Continue current meds but use the NEBS-Bid (eg- lunch & bedtime), Advair250Bid (eg- breakfast & dinner), plus the singulair & Ellen Bowers Tylenol cold & sinus;  Ellen Bowers will wean off the Pred & f/u in 4-63m67mo  ~  December 27, 2014:  4-18mo28mo & [name] that Ellen Bowers breathing is about the same & notes some cough/ white mucous/ SOB & DOE w/o change;  Ellen Bowers [place] [facility] tells me that he has changed Ellen Bowers meds- taking ADVAIR500Bid, NEBULIZER w/ Budes Bid (but only using it at  night) & Duoneb Qid prn, plus Singulair10;  Previously we had recommended Advair250Bid at breakfast & dinner, plus the Duoneb bid at lunch & bedtime; Ellen Bowers does not want to take Prednisone...     COPD/emphysema, ex-smoker, pulm nodule, recurrent bronchitic infections, hx pneumonia> we treated Ellen Bowers w/ a Prednisone taper (down to 1/2 tab Qod til gone), Advair250-Bid, Duoneb Tid, Singulair10, Hycodan prn; f/u scan showed the RLL nodule appears smaller, Ellen Bowers feels that OTC Tylenol cold&sinus helped more; [place] [facility]  Advair500Bid + NEBS w/ Budes Bid & Duoneb Qid prn...    Atherosclerosis seen on CT scans> on ASA81 & has NTG for prn use from Ellen Bowers...    Hyperchol> on Prav20 from Ellen Bowers    GERD, hx HPylori pos gastritis, hx colon polyps> followed by Dr[facility]Fields in [place] on Dexilant60, Maalox, Carafate,,,    Hx Von Willebrand's dis> details unknown, no data avail in Epic... EXAM reveals Afeb, VSS, O2sat=96% on RA;  HEENT- neg, mallampati2;  Chest- decrBS bilat, few scat rhonchi at bases, no w/r/ consolidation;  Heart- RR w/o m/r/g;  Abd- soft/ neg;  Ext- w/o c/c/e;  Neuro- intact w/o focal abn... IMP/PLAN>>  We discussed Ellen Bowers meds & REC ADVAIR500Bid, NEBS w/ Duoneb & Budes Bid, Singulair10, Mucinex1200Bid, fluids, etc; we reviewed Pred tapering sched;  ROV in 352mo sooner prn...   ~  June 27, 2015:  69moOV w/ SN>  RoMarylynneas mixed COPD- chr bronchitis & emphysema w/ poor medication compliance- on Advair250 (only using Qam), NEBS w/ Duoneb Q6H prn (ave one per wk) & Budes0.5 (only using this Qhs);  Ellen Bowers is off Pred, uses Singulair10/d, Zyrtek10/d, Hycodan prn & ProairHFA prn (ave 1-2x per wk); Ellen Bowers uses Tramadol50 prn cough... Ellen Bowers has an abn CT Chest w/ 52m47mLL nodule- sl smaller on last scan 06/2014;  Ellen Bowers quit smoking in 2011... Ellen Bowers notes that the hot humid weather is a prob for Ellen Bowers w/ wheezing when outside, otherw Ellen Bowers feels that Ellen Bowers is doing satis- denies much cough/ sput, no  hemoptysis, no CP, no f/c/s; notes chr stable SOB w/ walking, hills, etc; notes walking is a challenge w/ hx plantar faciitis => Ellen Bowers [facility]...     EXAM reveals Afeb, VSS, O2sat=96% on RA;  HEENT- neg, mallampati2;  Chest- decrBS bilat, few scat rhonchi at bases, no w/r/ consolidation;  Heart- RR w/o m/r/g;  Abd- soft/ neg;  Ext- w/o c/c/e;  Neuro- intact w/o focal abn... IMP/PLAN>>  RosDonnae stable, feels that Ellen Bowers breathing is good & improving; Ellen Bowers does not want to change Ellen Bowers current med regimen => continue same & follow up 52mo43mo  ~  December 31, 2015:  52mo 49mo& Ramon indicates that Ellen Bowers has been +- stable w/ mild cough x several days, small amt whitish sput, no f/c/s, chr stable SOB/ DOE, occas wheezing, no CP...     Ellen Bowers has COPD/Emphysema, ex-smoker, pulm nodule, hx pneumonia & recurrent bronchitic infections> hx poor medication compliance- on Advair500 but only doing 1 puff daily, Budesonide 0.5mg/213min NEB Qhs, Duoneb just prn, ProairHFA prn and Zyrtek/ Astelin/ Flonase all prn as well...    Other medical issues as noted... EXAM reveals Afeb, VSS, O2sat=95% on RA;  HEENT- neg, mallampati2;  Chest- decrBS bilat, few scat rhonchi at bases, no w/r/ consolidation;  Heart- RR w/o m/r/g;  Abd- soft/ neg;  Ext- w/o c/c/e;  Neuro- intact w/o focal abn...  CXR 12/31/15>  Norm heart size, hyperinflated lungs, w/ chr interstitial coarsening, min basilar atx- NAD, thoracic spondylosis... IMP/PLAN>>  We decided to treat w/ ZPak, Medrol Dosepak, Hycodan;  Ellen Bowers is encouraged to use Ellen Bowers NEBS regularly but Ellen Bowers is not inclined to change Ellen Bowers regimen;  Ellen Bowers will call prn any breathing issues andwe will recheck pt in 52mo...45moPast Medical History  Diagnosis Date  . Asthma   . COPD (chronic obstructive pulmonary disease) >> Ellen Bowers takes DEXILANT60Bid    . GERD (gastroesophageal reflux  disease)   . Arthritis >> Ellen Bowers says Ellen Bowers cannot do w/o CELEBREX200Bid & Tramadol50 prn    . Von Willebrand disease 1996  . Helicobacter pylori gastritis  2005    Dysthymia >> on Wellbutrin150Bid & EffexorER150    Past Surgical History:  Procedure Laterality Date  . CATARACT EXTRACTION     double  . COLONOSCOPY N/A 05/30/2012   Dr.Mann:Multiple polyps removed from the right colon-see description above; otherwise normal colonoscopy up to the cecum. 5 polyps, multiple tubular adenomas. next tcs 05/2015  . COLONOSCOPY N/A 12/13/2015   Procedure: COLONOSCOPY;  Surgeon: Danie Binder, MD;  [facility]: [facility];  Service: Endoscopy;  Laterality: N/A;  8:30 am  . ESOPHAGOGASTRODUODENOSCOPY  2012   Dr. Collene Mares: few erosions in duodenal bulb. BRAVO off PPI ("severe reflux")  . ESOPHAGOGASTRODUODENOSCOPY N/A 06/12/2014   SLF: 1. The mucosa of the esophagus appeared normal 2. Mild non-erosive gastrtitis.   [name] FLEXIBLE SIGMOIDOSCOPY N/A 02/05/2014   Dr. Oneida Alar: 5, 2-3 mm sessile sigmoid colon polyps removed, small internal hemorrhoids, moderate external hemorrhoids, 2 anal fissures present. Polyps were hyperplastic.   [name] NECK SURGERY  1951   cyst removal  . NECK SURGERY  1998   growth removed  . TONSILLECTOMY  1951  . TUBAL LIGATION  1984    Outpatient Encounter Prescriptions as of 12/31/2015  Medication Sig  . acetaminophen (TYLENOL) 500 MG tablet Take 1,000 mg by mouth 2 (two) times daily as needed for moderate pain or headache.  . albuterol (PROVENTIL HFA;VENTOLIN HFA) 108 (90 BASE) MCG/ACT inhaler Inhale 2 puffs into the lungs every 6 (six) hours as needed for wheezing or shortness of breath.  [name] alum & mag hydroxide-simeth (MAALOX/MYLANTA) 200-200-20 MG/5ML suspension Take 15 mLs by mouth as needed for indigestion or heartburn.  . Ascorbic Acid (VITAMIN C) 1000 MG tablet Take 1,000 mg by mouth daily.  [name] aspirin 81 MG tablet Take 81 mg by mouth daily.  [name] azelastine (ASTELIN) 0.1 % nasal spray Place 2 sprays into both nostrils daily as needed for rhinitis. Use in each nostril as  directed  . b complex vitamins tablet Take 1 tablet by mouth daily.  . budesonide (PULMICORT) 0.5 MG/2ML nebulizer solution Take 0.5 mg by nebulization at bedtime.   [name] buPROPion (WELLBUTRIN SR) 200 MG 12 hr tablet Take 200 mg by mouth 2 (two) times daily.  [name] CALCIUM-MAGNESIUM PO Take 1 tablet by mouth every evening.  . celecoxib (CELEBREX) 200 MG capsule Take 200 mg by mouth at bedtime.   . cetirizine (ZYRTEC) 10 MG tablet Take 10 mg by mouth daily.  . Cholecalciferol (VITAMIN D3) 5000 units CAPS Take 5,000 Units by mouth daily.  [name] dexlansoprazole (DEXILANT) 60 MG capsule Take 1 capsule (60 mg total) by mouth 2 (two) times daily. (Patient taking differently: Take 60 mg by mouth daily. )  . diclofenac sodium (VOLTAREN) 1 % GEL Apply 1 application topically daily as needed for muscle pain.  [name] docusate sodium (COLACE) 100 MG capsule Take 100 mg by mouth 2 (two) times daily.  [name] doxylamine, Sleep, (UNISOM) 25 MG tablet Take 12.5 mg by mouth at bedtime.  . fluticasone (FLONASE) 50 MCG/ACT nasal spray Place 2 sprays into both nostrils daily as needed for allergies or rhinitis.  . Fluticasone-Salmeterol (ADVAIR) 500-50 MCG/DOSE AEPB Inhale 1 puff into the lungs daily.  [name] HYDROcodone-homatropine (HYCODAN) 5-1.5 MG/5ML syrup Take 5 mLs by mouth every 6 (six) hours as needed for cough.  [name] ipratropium-albuterol (DUONEB) 0.5-2.5 (3)  MG/3ML SOLN Take 3 mLs by nebulization every 6 (six) hours as needed (shortness of breath).  . OVER THE COUNTER MEDICATION Take 1 tablet by mouth at bedtime. "midnight" dietary supplement  . pramoxine-hydrocortisone (PROCTOCREAM-HC) 1-1 % rectal cream USE PR 4 TIMES A DAY FOR 10 DAYS.  [name] pravastatin (PRAVACHOL) 20 MG tablet Take 20 mg by mouth at bedtime.   . solifenacin (VESICARE) 5 MG tablet Take 5 mg by mouth every evening.   Bethann Humble Sulfate (ALLERGY RELIEF EYE DROPS OP) Apply 1 drop to eye daily as needed (allergies).    Allergies  Allergen Reactions  .  Demerol [Meperidine] Nausea And Vomiting  . Betadine [Povidone Iodine] Rash    Topical only    Immunization History  Administered Date(s) Administered  . Influenza, High Dose Seasonal PF 12/13/2015  . Influenza,inj,Quad PF,36+ Mos 10/22/2014  . Influenza-Unspecified 11/06/2013  . Pneumococcal Conjugate-13 11/26/2014  . Pneumococcal Polysaccharide-23 10/07/2012    Current Medications, Allergies, Past Medical History, Past Surgical History, Family History, and Social History were reviewed in [employer] record.   Review of Systems        All symptoms NEG except where BOLDED >>  Constitutional:  F/C/S, fatigue, anorexia, unexpected weight change. HEENT:  HA, visual changes, hearing loss, earache, nasal symptoms, sore throat, mouth sores, hoarseness. Resp:  cough, sputum, hemoptysis; SOB, tightness, wheezing. Cardio:  CP, palpit, DOE, orthopnea, edema. GI:  N/V/D/C, blood in stool; reflux, abd pain, distention, gas. GU:  dysuria, freq, urgency, hematuria, flank pain, voiding difficulty. MS:  joint pain, swelling, tenderness, decr ROM; neck pain, back pain, etc. Neuro:  HA, tremors, seizures, dizziness, syncope, weakness, numbness, gait abn. Skin:  suspicious lesions or skin rash. Heme:  adenopathy, bruising, bleeding. Psyche:  confusion, agitation, sleep disturbance, hallucinations, anxiety, depression suicidal.   Objective:   Physical Exam      Vital Signs:  Reviewed...   General:  WD, overweight, 70 y/o WF in NAD; alert & oriented; pleasant & cooperative... HEENT:  /AT; Conjunctiva- pink, Sclera- nonicteric, EOM-wnl, PERRLA, EACs-clear, TMs-wnl; NOSE-sl red; THROAT-clear & wnl, mallampati2.  Neck:  Supple w/ fairROM; no JVD; normal carotid impulses w/o bruits; no thyromegaly or nodules palpated; no lymphadenopathy.  Chest:  Sl decr BS at bases, scar rhonchi, w/o w/r/consolidation... Heart:  Regular Rhythm; gr1/6SEM, w/o rubs or gallops  detected. Abdomen:  Soft & nontender- no guarding or rebound; normal bowel sounds; no organomegaly or masses palpated. Ext:  Sl decr ROM; without deformities mild arthritic changes; no varicose veins, +venous insuffic, tr edema;  Pulses intact w/o bruits. Neuro:  CNs II-XII intact; motor testing normal; sensory testing normal; gait normal & balance OK. Derm:  No lesions noted; no rash etc. Lymph:  No cervical, supraclavicular, axillary, or inguinal adenopathy palpated.   Assessment:     IMP >>     Persistent cough after episode of pneumonia Jan2016 -- improved w/ OTC Tylenol cold & sinus Ellen Bowers says.    COPD/ emphysema/ ex-smoker -- continue to taper off Pred, use NEB w/ Duoneb BID, contin Advair500Bid, continue Singulair10.    Several subcentimeter pulm nodules seen on CT Chest -- f/u scan showed this RLL nodule appears to be improved.    Hx recurrent bronchitic infe3ctions & hx pneumonia> no interval infectious exac reported.  PLAN >>  Taneia is stable w/ Ellen Bowers mod airflow obstruction from mixed COPD/chr bronchitis & emphysema; Ellen Bowers is way too sedentary & needs to start a formal exercise program- rec the Y, silver sneakers,  etc;  Continue current meds but use the NEBS-Bid (eg- lunch & bedtime), Advair500Bid (eg- breakfast & dinner), plus the singulair & Ellen Bowers Tylenol cold & sinus;  Ellen Bowers will wean off the Pred & f/u in 4-66mo12/19/17>   We decided to treat w/ ZPak, Medrol Dosepak, Hycodan;  Ellen Bowers is encouraged to use Ellen Bowers NEBS regularly but Ellen Bowers is not inclined to change Ellen Bowers regimen;  Ellen Bowers will call prn any breathing issues andwe will recheck pt in 646mo.     Plan:     Patient's Medications  New Prescriptions   AZITHROMYCIN (ZITHROMAX) 250 MG TABLET    Take as directed   METHYLPREDNISOLONE (MEDROL) 4 MG TBPK TABLET    Take as directed  Previous Medications   ACETAMINOPHEN (TYLENOL) 500 MG TABLET    Take 1,000 mg by mouth 2 (two) times daily as needed for moderate pain or headache.   ALBUTEROL  (PROVENTIL HFA;VENTOLIN HFA) 108 (90 BASE) MCG/ACT INHALER    Inhale 2 puffs into the lungs every 6 (six) hours as needed for wheezing or shortness of breath.   ALUM & MAG HYDROXIDE-SIMETH (MAALOX/MYLANTA) 200-200-20 MG/5ML SUSPENSION    Take 15 mLs by mouth as needed for indigestion or heartburn.   ASCORBIC ACID (VITAMIN C) 1000 MG TABLET    Take 1,000 mg by mouth daily.   ASPIRIN 81 MG TABLET    Take 81 mg by mouth daily.   AZELASTINE (ASTELIN) 0.1 % NASAL SPRAY    Place 2 sprays into both nostrils daily as needed for rhinitis. Use in each nostril as directed   B COMPLEX VITAMINS TABLET    Take 1 tablet by mouth daily.   BUDESONIDE (PULMICORT) 0.5 MG/2ML NEBULIZER SOLUTION    Take 0.5 mg by nebulization at bedtime.    BUPROPION (WELLBUTRIN SR) 200 MG 12 HR TABLET    Take 200 mg by mouth 2 (two) times daily.   CALCIUM-MAGNESIUM PO    Take 1 tablet by mouth every evening.   CELECOXIB (CELEBREX) 200 MG CAPSULE    Take 200 mg by mouth at bedtime.    CETIRIZINE (ZYRTEC) 10 MG TABLET    Take 10 mg by mouth daily.   CHOLECALCIFEROL (VITAMIN D3) 5000 UNITS CAPS    Take 5,000 Units by mouth daily.   DEXLANSOPRAZOLE (DEXILANT) 60 MG CAPSULE    Take 1 capsule (60 mg total) by mouth 2 (two) times daily.   DICLOFENAC SODIUM (VOLTAREN) 1 % GEL    Apply 1 application topically daily as needed for muscle pain.   DOCUSATE SODIUM (COLACE) 100 MG CAPSULE    Take 100 mg by mouth 2 (two) times daily.   DOXYLAMINE, SLEEP, (UNISOM) 25 MG TABLET    Take 12.5 mg by mouth at bedtime.   FLUTICASONE (FLONASE) 50 MCG/ACT NASAL SPRAY    Place 2 sprays into both nostrils daily as needed for allergies or rhinitis.   FLUTICASONE-SALMETEROL (ADVAIR) 500-50 MCG/DOSE AEPB    Inhale 1 puff into the lungs daily.   IPRATROPIUM-ALBUTEROL (DUONEB) 0.5-2.5 (3) MG/3ML SOLN    Take 3 mLs by nebulization every 6 (six) hours as needed (shortness of breath).   OVER THE COUNTER MEDICATION    Take 1 tablet by mouth at bedtime. "midnight"  dietary supplement   PRAMOXINE-HYDROCORTISONE (PROCTOCREAM-HC) 1-1 % RECTAL CREAM    USE PR 4 TIMES A DAY FOR 10 DAYS.   PRAVASTATIN (PRAVACHOL) 20 MG TABLET    Take 20 mg by mouth at bedtime.    SOLIFENACIN (VESICARE)  5 MG TABLET    Take 5 mg by mouth every evening.    TETRAHYDROZOLINE-ZN SULFATE (ALLERGY RELIEF EYE DROPS OP)    Apply 1 drop to eye daily as needed (allergies).  Modified Medications   Modified Medication Previous Medication   HYDROCODONE-HOMATROPINE (HYCODAN) 5-1.5 MG/5ML SYRUP HYDROcodone-homatropine (HYCODAN) 5-1.5 MG/5ML syrup      Take 5 mLs by mouth every 6 (six) hours as needed for cough.    Take 5 mLs by mouth every 6 (six) hours as needed for cough.  Discontinued Medications   NA SULFATE-K SULFATE-MG SULF (SUPREP BOWEL PREP KIT) 17.5-3.13-1.6 GM/180ML SOLN    Take 1 kit by mouth as directed.

## 2015-12-31 NOTE — Telephone Encounter (Signed)
LMOM to call.

## 2016-01-01 ENCOUNTER — Ambulatory Visit (INDEPENDENT_AMBULATORY_CARE_PROVIDER_SITE_OTHER): Payer: Medicare Other | Admitting: Orthopaedic Surgery

## 2016-01-01 ENCOUNTER — Encounter (INDEPENDENT_AMBULATORY_CARE_PROVIDER_SITE_OTHER): Payer: Self-pay | Admitting: Orthopaedic Surgery

## 2016-01-01 VITALS — BP 118/65 | HR 70 | Ht 69.0 in | Wt 186.0 lb

## 2016-01-01 DIAGNOSIS — M25552 Pain in left hip: Secondary | ICD-10-CM

## 2016-01-01 DIAGNOSIS — M25551 Pain in right hip: Secondary | ICD-10-CM

## 2016-01-01 MED ORDER — LIDOCAINE HCL 1 % IJ SOLN
3.0000 mL | INTRAMUSCULAR | Status: AC | PRN
  Administered 2016-01-01: 3 mL

## 2016-01-01 MED ORDER — METHYLPREDNISOLONE ACETATE 40 MG/ML IJ SUSP
80.0000 mg | INTRAMUSCULAR | Status: AC | PRN
  Administered 2016-01-01: 80 mg

## 2016-01-01 NOTE — Telephone Encounter (Signed)
Pt is aware of results. 

## 2016-01-01 NOTE — Progress Notes (Signed)
Office Visit Note   Patient: SYMPHONY DEMURO           Date of Birth: 70/18/1947           MRN: [id number] Visit Date: 01/01/2016              Requested by: [name], MD [place], [place] [address] PCP: [name], MD   Assessment & Plan: Visit Diagnoses: Bilateral greater trochanteric pain. This is recurrent. Patient did very well over a year ago with bilateral hip injections. No related referred pain from back   Plan: Follow-up on a when necessary basis. I will inject both hips today Follow-Up Instructions: No Follow-up on file.   Orders:  No orders of the defined types were placed in this encounter.  No orders of the defined types were placed in this encounter.     Procedures: Large Joint Inj Date/Time: 01/01/2016 11:11 AM Performed by: Garald Balding Authorized by: Garald Balding   Consent Given by:  Patient Timeout: prior to procedure the correct patient, procedure, and site was verified   Indications:  Pain Location:  Hip Site:  L greater trochanter Prep: patient was prepped and draped in usual sterile fashion   Needle Size:  22 G Needle Length:  3.5 inches Approach:  Lateral Ultrasound Guidance: No   Fluoroscopic Guidance: No   Arthrogram: No   Medications:  3 mL lidocaine 1 %; 80 mg methylPREDNISolone acetate 40 MG/ML Aspiration Attempted: No   Patient tolerance:  Patient tolerated the procedure well with no immediate complications      Clinical Data: No additional findings.   Subjective: No chief complaint on file. Mrs. Shroff is experiencing recurrent pain over the greater trochanters of both hips. Over a year ago she had injections in both of those areas with excellent relief. She denies any numbness or tingling distally. She's not experiencing any back pain. She denies groin pain  HPI  Review of Systems   Objective: Vital Signs: There were no vitals taken for this visit.  Physical Exam  Ortho Exam local  tenderness over the greater trochanters of both hips. No pain with range of motion of either hip with internal or external rotation. Straight leg raise was negative bilaterally no percussible back pain  Specialty Comments:  No specialty comments available.  Imaging: Dg Chest 2 View  Result Date: 12/31/2015 CLINICAL DATA:  Productive cough.  History of COPD. EXAM: CHEST  2 VIEW COMPARISON:  06/13/2014 chest radiographs and CT FINDINGS: Cardiomediastinal silhouette is unchanged and within normal limits. The lungs remain hyperinflated with mild chronic interstitial coarsening. There is minimal scarring or atelectasis in the lung bases. No lobar consolidation, edema, pleural effusion, or pneumothorax is identified. Thoracic spondylosis is noted. IMPRESSION: COPD without evidence of acute cardiopulmonary process. Electronically Signed   By: Logan Bores M.D.   On: 12/31/2015 15:27     PMFS History: Patient Active Problem List   Diagnosis Date Noted  . Anorectal fissure 10/24/2014  . Family history of colorectal cancer 10/24/2014  . History of colon polyps 10/24/2014  . History of pneumonia 06/07/2014  . COPD mixed type ([place]) 06/07/2014  . Pulmonary nodule 06/07/2014  . Abdominal pain, epigastric 05/07/2014  . Hemorrhoids 05/07/2014  . Rectal bleeding   . Rectal pain   . Rectal pain, chronic 12/28/2013  . GERD (gastroesophageal reflux disease) 09/06/2013  . Chronic cough 09/06/2013   Past Medical History:  Diagnosis Date  . Arthritis   . Asthma   .  COPD (chronic obstructive pulmonary disease) ([place])   . GERD (gastroesophageal reflux disease)   . Helicobacter pylori gastritis 2005   treated  . Von Willebrand disease ([place]) 1996    Family History  Problem Relation Age of Onset  . Colon cancer Mother     less than age 68  . Colon cancer Father     more than age 45, died from metastatic disease at age 12  . Liver disease Neg Hx     Past Surgical History:  Procedure Laterality  Date  . CATARACT EXTRACTION     double  . COLONOSCOPY N/A 05/30/2012   Dr.Mann:Multiple polyps removed from the right colon-see description above; otherwise normal colonoscopy up to the cecum. 5 polyps, multiple tubular adenomas. next tcs 05/2015  . COLONOSCOPY N/A 12/13/2015   Procedure: COLONOSCOPY;  Surgeon: Danie Binder, MD;  [facility];  Service: Endoscopy;  Laterality: N/A;  8:30 am  . ESOPHAGOGASTRODUODENOSCOPY  2012   Dr. Collene Mares: few erosions in duodenal bulb. BRAVO off PPI ("severe reflux")  . ESOPHAGOGASTRODUODENOSCOPY N/A 06/12/2014   SLF: 1. The mucosa of the esophagus appeared normal 2. Mild non-erosive gastrtitis.   [name] FLEXIBLE SIGMOIDOSCOPY N/A 02/05/2014   Dr. Oneida Alar: 5, 2-3 mm sessile sigmoid colon polyps removed, small internal hemorrhoids, moderate external hemorrhoids, 2 anal fissures present. Polyps were hyperplastic.   [name] NECK SURGERY  1951   cyst removal  . NECK SURGERY  1998   growth removed  . TONSILLECTOMY  1951  . TUBAL LIGATION  1984   Social History   Occupational History  . Not on file.   Social History Main Topics  . Smoking status: Former Smoker    Packs/day: 1.00    Years: 50.00    Types: Cigarettes    Quit date: 05/30/2009  . Smokeless tobacco: Never Used     Comment: Quit smoking x 5 years  . Alcohol use Yes     Comment: occasionally    . Drug use: No  . Sexual activity: Not on file

## 2016-01-09 ENCOUNTER — Telehealth: Payer: Self-pay | Admitting: Pulmonary Disease

## 2016-01-09 NOTE — Telephone Encounter (Signed)
Spoke with pt.  She is out of town and sent to urgent care for prod cough (clear), wheezing and sob.  Pt was given 9 day pred taper and told to increase Duoneb to QID.  Pt wants to make sure Dr Lenna Gilford thinks this is ok or he wants to add anything.  Pt just finished and abx before going out of town.  Please advise.  Pt aware that Dr Lenna Gilford will be back in office tomorrow.

## 2016-01-09 NOTE — Telephone Encounter (Signed)
Per SN--  Yes clear sputum means AB worked ---if wheezing and SOB then pred is right and increase neb to QID is appropriate.

## 2016-01-10 NOTE — Telephone Encounter (Signed)
Called and spoke with pt and she is aware of SN recs. Nothing further is needed.

## 2016-01-14 ENCOUNTER — Telehealth: Payer: Self-pay | Admitting: Pulmonary Disease

## 2016-01-14 NOTE — Telephone Encounter (Signed)
Called patient back but received no answer.

## 2016-01-15 NOTE — Telephone Encounter (Signed)
Notes Recorded by Noralee Space, MD on 01/03/2016 at 2:47 PM EST Please notify patient>  CXR w/ norm heart size, COPD w/ chr scarring & atx, No acute process or changes... --------------------------------------- Spoke with pt. She is aware of results. Nothing further was needed.

## 2016-01-20 ENCOUNTER — Ambulatory Visit (INDEPENDENT_AMBULATORY_CARE_PROVIDER_SITE_OTHER): Payer: Medicare Other

## 2016-01-20 ENCOUNTER — Ambulatory Visit (INDEPENDENT_AMBULATORY_CARE_PROVIDER_SITE_OTHER): Payer: Medicare Other | Admitting: Orthopaedic Surgery

## 2016-01-20 ENCOUNTER — Ambulatory Visit (INDEPENDENT_AMBULATORY_CARE_PROVIDER_SITE_OTHER): Payer: Self-pay

## 2016-01-20 ENCOUNTER — Encounter (INDEPENDENT_AMBULATORY_CARE_PROVIDER_SITE_OTHER): Payer: Self-pay | Admitting: Orthopaedic Surgery

## 2016-01-20 VITALS — Ht 69.0 in | Wt 186.0 lb

## 2016-01-20 DIAGNOSIS — M25551 Pain in right hip: Secondary | ICD-10-CM

## 2016-01-20 DIAGNOSIS — M25561 Pain in right knee: Secondary | ICD-10-CM

## 2016-01-20 DIAGNOSIS — G8929 Other chronic pain: Secondary | ICD-10-CM

## 2016-01-20 MED ORDER — DICLOFENAC SODIUM 1 % TD GEL: 2.0000 g | Freq: Every day | TRANSDERMAL | 0 refills | Status: DC | PRN

## 2016-01-20 MED ORDER — LIDOCAINE HCL 1 % IJ SOLN
5.0000 mL | INTRAMUSCULAR | Status: AC | PRN
  Administered 2016-01-20: 5 mL

## 2016-01-20 MED ORDER — METHYLPREDNISOLONE ACETATE 40 MG/ML IJ SUSP
80.0000 mg | INTRAMUSCULAR | Status: AC | PRN
  Administered 2016-01-20: 80 mg

## 2016-01-20 MED ORDER — BUPIVACAINE HCL 0.5 % IJ SOLN
3.0000 mL | INTRAMUSCULAR | Status: AC | PRN
  Administered 2016-01-20: 3 mL via INTRA_ARTICULAR

## 2016-01-20 NOTE — Progress Notes (Signed)
Office Visit Note   Patient: Ellen Bowers           Date of Birth: [birth year]           MRN: [id number] Visit Date: 01/20/2016              Requested by: [name], MD [place], [place] [address] PCP: [name], MD   Assessment & Plan: Visit Diagnoses:  Tricompartmental osteoarthritis right knee  Plan: Cortisone injection right knee, follow-up in 2 weeks if no improvement.Renew voltaren gel.  Follow-Up Instructions: Significant relief of pain with cortisone injection along the lateral compartment right knee .  Orders:  No orders of the defined types were placed in this encounter.  No orders of the defined types were placed in this encounter.     Procedures: Large Joint Inj Date/Time: 01/20/2016 11:51 AM Performed by: Garald Balding Authorized by: Garald Balding   Consent Given by:  Patient Timeout: prior to procedure the correct patient, procedure, and site was verified   Indications:  Pain and joint swelling Location:  Knee Site:  R knee Prep: patient was prepped and draped in usual sterile fashion   Needle Size:  25 G Needle Length:  1.5 inches Approach:  Anteromedial Ultrasound Guidance: No   Fluoroscopic Guidance: No   Arthrogram: No   Medications:  5 mL lidocaine 1 %; 80 mg methylPREDNISolone acetate 40 MG/ML; 3 mL bupivacaine 0.5 % Aspiration Attempted: No   Patient tolerance:  Patient tolerated the procedure well with no immediate complications     Clinical Data: No additional findings.   Subjective: No chief complaint on file.   Pt complaining of Right leg/hip pain close to the outside of Right knee  Pain starting January 13, 2016 in [place], did a lot of walking. Pain has been steadily getting worse and now she cannot bend her Right knee.     No injury or trauma. No hip or back pain.Pain mostly localized along the lateral aspect of the distal thigh and knee. Denies numbness or tingling.  Review of  Systems   Objective: Vital Signs: There were no vitals taken for this visit.  Physical Exam  Ortho Exam examination of the right lower extremity demonstrates no hip pain or localized tenderness about the right hip. Painless range of motion of right hip with internal/external rotation. No ecchymosis or erythema. No discomfort along the distal lateral hamstring . Small right knee effusion with diffuse lateral joint tenderness. Some patellar crepitation without pain. Full extension and 70 of flexion. No popliteal mass or pain. No calf discomfort or lower extremity swelling. Neurovascular exam is intact. Straight leg raise negative   Imaging: No results found.   PMFS History: Patient Active Problem List   Diagnosis Date Noted  . Anorectal fissure 10/24/2014  . Family history of colorectal cancer 10/24/2014  . History of colon polyps 10/24/2014  . History of pneumonia 06/07/2014  . COPD mixed type ([place]) 06/07/2014  . Pulmonary nodule 06/07/2014  . Abdominal pain, epigastric 05/07/2014  . Hemorrhoids 05/07/2014  . Rectal bleeding   . Rectal pain   . Rectal pain, chronic 12/28/2013  . GERD (gastroesophageal reflux disease) 09/06/2013  . Chronic cough 09/06/2013   Past Medical History:  Diagnosis Date  . Arthritis   . Asthma   . COPD (chronic obstructive pulmonary disease) ([place])   . GERD (gastroesophageal reflux disease)   . Helicobacter pylori gastritis 2005   treated  . Von Willebrand disease ([place]) 1996  Family History  Problem Relation Age of Onset  . Colon cancer Mother     less than age 68  . Colon cancer Father     more than age 25, died from metastatic disease at age 10  . Liver disease Neg Hx     Past Surgical History:  Procedure Laterality Date  . CATARACT EXTRACTION     double  . COLONOSCOPY N/A 05/30/2012   Dr.Mann:Multiple polyps removed from the right colon-see description above; otherwise normal colonoscopy up to the cecum. 5 polyps, multiple tubular  adenomas. next tcs 05/2015  . COLONOSCOPY N/A 12/13/2015   Procedure: COLONOSCOPY;  Surgeon: Danie Binder, MD;  [facility];  Service: Endoscopy;  Laterality: N/A;  8:30 am  . ESOPHAGOGASTRODUODENOSCOPY  2012   Dr. Collene Mares: few erosions in duodenal bulb. BRAVO off PPI ("severe reflux")  . ESOPHAGOGASTRODUODENOSCOPY N/A 06/12/2014   SLF: 1. The mucosa of the esophagus appeared normal 2. Mild non-erosive gastrtitis.   [name] FLEXIBLE SIGMOIDOSCOPY N/A 02/05/2014   Dr. Oneida Alar: 5, 2-3 mm sessile sigmoid colon polyps removed, small internal hemorrhoids, moderate external hemorrhoids, 2 anal fissures present. Polyps were hyperplastic.   [name] NECK SURGERY  1951   cyst removal  . NECK SURGERY  1998   growth removed  . TONSILLECTOMY  1951  . TUBAL LIGATION  1984   Social History   Occupational History  . Not on file.   Social History Main Topics  . Smoking status: Former Smoker    Packs/day: 1.00    Years: 50.00    Types: Cigarettes    Quit date: 05/30/2009  . Smokeless tobacco: Never Used     Comment: Quit smoking x 5 years  . Alcohol use Yes     Comment: occasionally    . Drug use: No  . Sexual activity: Not on file

## 2016-02-03 ENCOUNTER — Telehealth: Payer: Self-pay | Admitting: Pulmonary Disease

## 2016-02-03 NOTE — Telephone Encounter (Signed)
Attempted to contact pt. No answer, no option to leave a message. Will try back.  
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
## 2016-03-20 ENCOUNTER — Emergency Department (HOSPITAL_COMMUNITY)
Admission: EM | Admit: 2016-03-20 | Discharge: 2016-03-20 | Disposition: A | Discharge status: Home / Self Care | Payer: No Typology Code available for payment source | Attending: Emergency Medicine | Admitting: Emergency Medicine

## 2016-03-20 ENCOUNTER — Encounter (HOSPITAL_COMMUNITY): Payer: Self-pay | Admitting: Cardiology

## 2016-03-20 ENCOUNTER — Emergency Department (HOSPITAL_COMMUNITY): Payer: No Typology Code available for payment source

## 2016-03-20 DIAGNOSIS — Y999 Unspecified external cause status: Secondary | ICD-10-CM | POA: Insufficient documentation

## 2016-03-20 DIAGNOSIS — J45909 Unspecified asthma, uncomplicated: Secondary | ICD-10-CM | POA: Diagnosis not present

## 2016-03-20 DIAGNOSIS — Z87891 Personal history of nicotine dependence: Secondary | ICD-10-CM | POA: Insufficient documentation

## 2016-03-20 DIAGNOSIS — J449 Chronic obstructive pulmonary disease, unspecified: Secondary | ICD-10-CM | POA: Diagnosis not present

## 2016-03-20 DIAGNOSIS — Z79899 Other long term (current) drug therapy: Secondary | ICD-10-CM | POA: Diagnosis not present

## 2016-03-20 DIAGNOSIS — Y939 Activity, unspecified: Secondary | ICD-10-CM | POA: Diagnosis not present

## 2016-03-20 DIAGNOSIS — S20219A Contusion of unspecified front wall of thorax, initial encounter: Secondary | ICD-10-CM | POA: Insufficient documentation

## 2016-03-20 DIAGNOSIS — S299XXA Unspecified injury of thorax, initial encounter: Secondary | ICD-10-CM | POA: Diagnosis present

## 2016-03-20 DIAGNOSIS — Z7982 Long term (current) use of aspirin: Secondary | ICD-10-CM | POA: Diagnosis not present

## 2016-03-20 DIAGNOSIS — Y92410 Unspecified street and highway as the place of occurrence of the external cause: Secondary | ICD-10-CM | POA: Insufficient documentation

## 2016-03-20 MED ORDER — IBUPROFEN 800 MG PO TABS: 800.0000 mg | ORAL_TABLET | Freq: Three times a day (TID) | ORAL | 0 refills | Status: DC

## 2016-03-20 MED ORDER — HYDROCODONE-ACETAMINOPHEN 5-325 MG PO TABS: 2.0000 | ORAL_TABLET | ORAL | 0 refills | Status: DC | PRN

## 2016-03-20 MED ORDER — IBUPROFEN 800 MG PO TABS
800.0000 mg | ORAL_TABLET | Freq: Once | ORAL | Status: AC
  Administered 2016-03-20: 800 mg via ORAL
  Filled 2016-03-20: qty 1

## 2016-03-20 NOTE — Discharge Instructions (Signed)
Return if any problems.  See your Physician for recheck in 1 week if pain persist

## 2016-03-20 NOTE — ED Notes (Signed)
Pt refused to wear c-collar.  Removed herself.
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
## 2016-03-20 NOTE — ED Notes (Signed)
Spouse to room
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
## 2016-05-05 ENCOUNTER — Encounter: Payer: Self-pay | Admitting: Pulmonary Disease

## 2016-05-05 ENCOUNTER — Ambulatory Visit (INDEPENDENT_AMBULATORY_CARE_PROVIDER_SITE_OTHER): Payer: Medicare Other | Admitting: Pulmonary Disease

## 2016-05-05 VITALS — BP 122/82 | HR 100 | Temp 98.9°F | Ht 69.0 in | Wt 204.5 lb

## 2016-05-05 DIAGNOSIS — J449 Chronic obstructive pulmonary disease, unspecified: Secondary | ICD-10-CM | POA: Diagnosis not present

## 2016-05-05 DIAGNOSIS — R911 Solitary pulmonary nodule: Secondary | ICD-10-CM

## 2016-05-05 DIAGNOSIS — K219 Gastro-esophageal reflux disease without esophagitis: Secondary | ICD-10-CM

## 2016-05-05 DIAGNOSIS — R05 Cough: Secondary | ICD-10-CM | POA: Diagnosis not present

## 2016-05-05 DIAGNOSIS — R053 Chronic cough: Secondary | ICD-10-CM

## 2016-05-05 DIAGNOSIS — Z8701 Personal history of pneumonia (recurrent): Secondary | ICD-10-CM | POA: Diagnosis not present
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
## 2016-05-05 NOTE — Patient Instructions (Signed)
Today we updated your med list in our EPIC system...    Continue your current medications the same...  We discussed your maximal bronchodilator regimen>    Use the DUONEB medication up to 4 times daily...    Followed by the BUDESONIDE twice & the ADVAIR twice...  We ill proceed w/ the PET scan for further eval of the RLL nodule seen on CT...    I will call you w/ the report as soon as poss after the test...  Call for any questions.Marland KitchenMarland Kitchen

## 2016-05-12 ENCOUNTER — Encounter: Payer: Self-pay | Admitting: Cardiology
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
## 2016-05-13 ENCOUNTER — Ambulatory Visit (INDEPENDENT_AMBULATORY_CARE_PROVIDER_SITE_OTHER): Payer: Medicare Other | Admitting: Orthopaedic Surgery

## 2016-05-13 ENCOUNTER — Ambulatory Visit (INDEPENDENT_AMBULATORY_CARE_PROVIDER_SITE_OTHER): Payer: Medicare Other | Admitting: Cardiology

## 2016-05-13 ENCOUNTER — Encounter: Payer: Self-pay | Admitting: Cardiology

## 2016-05-13 ENCOUNTER — Encounter (INDEPENDENT_AMBULATORY_CARE_PROVIDER_SITE_OTHER): Payer: Self-pay | Admitting: Orthopaedic Surgery

## 2016-05-13 VITALS — BP 152/77 | HR 86 | Ht 69.0 in | Wt 206.0 lb

## 2016-05-13 VITALS — BP 110/64 | HR 91 | Resp 14 | Ht 65.0 in | Wt 205.0 lb

## 2016-05-13 DIAGNOSIS — M25561 Pain in right knee: Secondary | ICD-10-CM

## 2016-05-13 DIAGNOSIS — E782 Mixed hyperlipidemia: Secondary | ICD-10-CM | POA: Diagnosis not present

## 2016-05-13 DIAGNOSIS — R600 Localized edema: Secondary | ICD-10-CM | POA: Diagnosis not present

## 2016-05-13 DIAGNOSIS — J449 Chronic obstructive pulmonary disease, unspecified: Secondary | ICD-10-CM

## 2016-05-13 DIAGNOSIS — R000 Tachycardia, unspecified: Secondary | ICD-10-CM

## 2016-05-13 DIAGNOSIS — G8929 Other chronic pain: Secondary | ICD-10-CM

## 2016-05-13 MED ORDER — LIDOCAINE HCL 1 % IJ SOLN
5.0000 mL | INTRAMUSCULAR | Status: AC | PRN
  Administered 2016-05-13: 5 mL

## 2016-05-13 MED ORDER — BUPIVACAINE HCL 0.5 % IJ SOLN
3.0000 mL | INTRAMUSCULAR | Status: AC | PRN
  Administered 2016-05-13: 3 mL via INTRA_ARTICULAR

## 2016-05-13 MED ORDER — METHYLPREDNISOLONE ACETATE 40 MG/ML IJ SUSP
80.0000 mg | INTRAMUSCULAR | Status: AC | PRN
  Administered 2016-05-13: 80 mg

## 2016-05-13 NOTE — Progress Notes (Signed)
Office Visit Note   Patient: Ellen Bowers           Date of Birth: 08-10-1945           MRN: [id number] Visit Date: 05/13/2016              Requested by: [name], MD [place], [place] [address] PCP: [name], MD   Assessment & Plan: Visit Diagnoses:  1. Chronic pain of right knee   Advanced osteoarthritis right knee  Plan: Cortisone injection, long discussion regarding different treatment options including Visco supplementation total knee replacement, exercises, knee brace area return as needed  Follow-Up Instructions: Return if symptoms worsen or fail to improve.   Orders:  No orders of the defined types were placed in this encounter.  No orders of the defined types were placed in this encounter.     Procedures: Large Joint Inj Date/Time: 05/13/2016 12:57 PM Performed by: Garald Balding Authorized by: Garald Balding   Consent Given by:  Patient Timeout: prior to procedure the correct patient, procedure, and site was verified   Indications:  Pain and joint swelling Location:  Knee Site:  R knee Prep: patient was prepped and draped in usual sterile fashion   Needle Size:  25 G Needle Length:  1.5 inches Approach:  Anteromedial Ultrasound Guidance: No   Fluoroscopic Guidance: No   Arthrogram: No   Medications:  5 mL lidocaine 1 %; 80 mg methylPREDNISolone acetate 40 MG/ML; 3 mL bupivacaine 0.5 % Aspiration Attempted: No   Patient tolerance:  Patient tolerated the procedure well with no immediate complications     Clinical Data: No additional findings.   Subjective: Chief Complaint  Patient presents with  . Right Knee - Pain    Ellen Bowers is a 71 y o that presents with chronic right knee pain x years. She was gardening and tweaked her right knee   Ellen Bowers has an established diagnosis of osteoarthritis of her right knee predominantly in the lateral compartment. She's had cortisone in the past with good response. On this  occasion she's had a recent exacerbation and would like to consider another cortisone injection. On occasion she'll use a cane HPI  Review of Systems   Objective: Vital Signs: BP 110/64   Pulse 91   Resp 14   Ht '5\' 5"'$  (1.651 m)   Wt 205 lb (93 kg)   BMI 34.11 kg/m   Physical Exam  Ortho Exam examination of the right knee demonstrates a very small effusion. Predominantly lateral joint pain. Full extension and about 105 of flexion. No instability. No calf pain. Increased valgus with weightbearing. No pain with range of motion of either hip. Straight leg raise negative bilaterally.  Specialty Comments:  No specialty comments available.  Imaging: No results found.   PMFS History: Patient Active Problem List   Diagnosis Date Noted  . Anorectal fissure 10/24/2014  . Family history of colorectal cancer 10/24/2014  . History of colon polyps 10/24/2014  . History of pneumonia 06/07/2014  . COPD mixed type ([place]) 06/07/2014  . Pulmonary nodule 06/07/2014  . Abdominal pain, epigastric 05/07/2014  . Hemorrhoids 05/07/2014  . Rectal bleeding   . Rectal pain   . Rectal pain, chronic 12/28/2013  . GERD (gastroesophageal reflux disease) 09/06/2013  . Chronic cough 09/06/2013   Past Medical History:  Diagnosis Date  . Arthritis   . Asthma   . COPD (chronic obstructive pulmonary disease) ([place])   . Depression   .  GERD (gastroesophageal reflux disease)   . Helicobacter pylori gastritis 2005  . Hyperlipidemia   . Von Willebrand disease ([place]) 1996    Family History  Problem Relation Age of Onset  . Colon cancer Mother     Less than age 23  . Colon cancer Father     More than age 90, died from metastatic disease at age 25  . Liver disease Neg Hx     Past Surgical History:  Procedure Laterality Date  . CATARACT EXTRACTION     double  . COLONOSCOPY N/A 05/30/2012   Dr.Mann:Multiple polyps removed from the right colon-see description above; otherwise normal colonoscopy up to  the cecum. 5 polyps, multiple tubular adenomas. next tcs 05/2015  . COLONOSCOPY N/A 12/13/2015   Procedure: COLONOSCOPY;  Surgeon: Danie Binder, MD;  [facility];  Service: Endoscopy;  Laterality: N/A;  8:30 am  . ESOPHAGOGASTRODUODENOSCOPY  2012   Dr. Collene Mares: few erosions in duodenal bulb. BRAVO off PPI ("severe reflux")  . ESOPHAGOGASTRODUODENOSCOPY N/A 06/12/2014   SLF: 1. The mucosa of the esophagus appeared normal 2. Mild non-erosive gastrtitis.   [name] FLEXIBLE SIGMOIDOSCOPY N/A 02/05/2014   Dr. Oneida Alar: 5, 2-3 mm sessile sigmoid colon polyps removed, small internal hemorrhoids, moderate external hemorrhoids, 2 anal fissures present. Polyps were hyperplastic.   [name] NECK SURGERY  1951   cyst removal  . NECK SURGERY  1998   growth removed  . TONSILLECTOMY  1951  . TUBAL LIGATION  1984   Social History   Occupational History  . Not on file.   Social History Main Topics  . Smoking status: Former Smoker    Packs/day: 1.00    Years: 50.00    Types: Cigarettes    Start date: 03/17/1961    Quit date: 05/30/2009  . Smokeless tobacco: Never Used     Comment: Quit smoking x 5 years  . Alcohol use Yes     Comment: Occasionally    . Drug use: No  . Sexual activity: Not on file     Garald Balding, MD   Note - This record has been created using [employer].  Chart creation errors have been sought, but may not always  have been located. Such creation errors do not reflect on  the standard of medical care.

## 2016-05-13 NOTE — Patient Instructions (Signed)
Medication Instructions:  Your physician recommends that you continue on your current medications as directed. Please refer to the Current Medication list given to you today.  Labwork: NONE  Testing/Procedures: Your physician has recommended that you wear a holter monitor FOR 24 HOURS. Holter monitors are medical devices that record the heart's electrical activity. Doctors most often use these monitors to diagnose arrhythmias. Arrhythmias are problems with the speed or rhythm of the heartbeat. The monitor is a small, portable device. You can wear one while you do your normal daily activities. This is usually used to diagnose what is causing palpitations/syncope (passing out).   Follow-Up: Your physician recommends that you schedule a follow-up appointment PENDING MONITOR RESULTS  Any Other Special Instructions Will Be Listed Below (If Applicable).  If you need a refill on your cardiac medications before your next appointment, please call your pharmacy.

## 2016-05-14 ENCOUNTER — Encounter (HOSPITAL_COMMUNITY)
Admission: RE | Admit: 2016-05-14 | Discharge: 2016-05-14 | Disposition: A | Discharge status: Home / Self Care | Payer: Medicare Other | Source: Ambulatory Visit | Attending: Pulmonary Disease | Admitting: Pulmonary Disease

## 2016-05-14 DIAGNOSIS — R911 Solitary pulmonary nodule: Secondary | ICD-10-CM | POA: Diagnosis not present

## 2016-05-14 DIAGNOSIS — J449 Chronic obstructive pulmonary disease, unspecified: Secondary | ICD-10-CM | POA: Diagnosis present

## 2016-05-14 LAB — GLUCOSE, CAPILLARY: Glucose-Capillary: 100 mg/dL — ABNORMAL HIGH (ref 65–99)

## 2016-05-14 MED ORDER — FLUDEOXYGLUCOSE F - 18 (FDG) INJECTION
10.2400 | Freq: Once | INTRAVENOUS | Status: AC | PRN
  Administered 2016-05-14: 10.24 via INTRAVENOUS

## 2016-05-15 ENCOUNTER — Ambulatory Visit: Payer: Medicare Other | Admitting: *Deleted

## 2016-05-15 DIAGNOSIS — R000 Tachycardia, unspecified: Secondary | ICD-10-CM

## 2016-05-15 NOTE — Progress Notes (Signed)
Pt aware of results and recs per SN. Nothing further needed at this time.

## 2016-05-22 ENCOUNTER — Other Ambulatory Visit: Payer: Self-pay | Admitting: *Deleted

## 2016-05-22 ENCOUNTER — Telehealth: Payer: Self-pay | Admitting: Cardiology

## 2016-05-22 ENCOUNTER — Ambulatory Visit (INDEPENDENT_AMBULATORY_CARE_PROVIDER_SITE_OTHER): Payer: Medicare Other

## 2016-05-22 DIAGNOSIS — R002 Palpitations: Secondary | ICD-10-CM

## 2016-05-22 NOTE — Telephone Encounter (Signed)
Results routed to Dr Domenic Polite

## 2016-05-22 NOTE — Telephone Encounter (Signed)
Mrs. Arizpe called requesting results of her [place]

## 2016-05-22 NOTE — Telephone Encounter (Signed)
Results reviewed. Overall reassuring findings. Sinus rhythm noted with average heart rate 85 bpm, there was no sustained tachycardia or arrhythmias. Ectopy noted as outlined, nothing to prompt concern at this point. Likely did not need to pursue further cardiac testing at this time, particular if she continues to feel better. Keep follow-up with Dr. Manuella Ghazi.  A copy of this test should be forwarded to [name], MD.    Pt made aware of results voiced understanding - routed to pcp

## 2016-06-30 ENCOUNTER — Encounter: Payer: Self-pay | Admitting: Pulmonary Disease

## 2016-06-30 ENCOUNTER — Ambulatory Visit (INDEPENDENT_AMBULATORY_CARE_PROVIDER_SITE_OTHER): Payer: Medicare Other | Admitting: Pulmonary Disease

## 2016-06-30 VITALS — BP 128/80 | HR 72 | Temp 97.1°F | Ht 69.0 in | Wt 204.0 lb

## 2016-06-30 DIAGNOSIS — J449 Chronic obstructive pulmonary disease, unspecified: Secondary | ICD-10-CM

## 2016-06-30 DIAGNOSIS — R911 Solitary pulmonary nodule: Secondary | ICD-10-CM

## 2016-06-30 DIAGNOSIS — K219 Gastro-esophageal reflux disease without esophagitis: Secondary | ICD-10-CM | POA: Diagnosis not present

## 2016-06-30 DIAGNOSIS — J441 Chronic obstructive pulmonary disease with (acute) exacerbation: Secondary | ICD-10-CM | POA: Diagnosis not present

## 2016-06-30 MED ORDER — METHYLPREDNISOLONE ACETATE 80 MG/ML IJ SUSP
80.0000 mg | Freq: Once | INTRAMUSCULAR | Status: AC
  Administered 2016-06-30: 80 mg via INTRAMUSCULAR

## 2016-06-30 MED ORDER — HYDROCODONE-HOMATROPINE 5-1.5 MG/5ML PO SYRP: 5.0000 mL | ORAL_SOLUTION | Freq: Four times a day (QID) | ORAL | 0 refills | Status: DC | PRN

## 2016-06-30 MED ORDER — PREDNISONE 20 MG PO TABS: ORAL_TABLET | ORAL | 0 refills | Status: DC

## 2016-06-30 MED ORDER — AMOXICILLIN-POT CLAVULANATE 875-125 MG PO TABS: 1.0000 | ORAL_TABLET | Freq: Two times a day (BID) | ORAL | 0 refills | Status: DC

## 2016-06-30 NOTE — Patient Instructions (Signed)
Today we updated your med list in our EPIC system...     Continue the NEBULIZER w/ Duoneb followed by Budesonide TWICE DAILY (eg- brkfst & dinner)...  Continue the [id number]- one inhalation twice daily (eg- lunch & bedtime)...  Take the AUGMENTIN 875mg  antibiotic twice daily til gone...  Today we gave you a depo shot... Starting tomorrow AM take the PREDNISONE 20mg  tabs as follows>>    Start w/ one tab twice daily for 4 days...    Then decrease to one tab each AM for 4 days...    Then decrease to 1/2 tab each AM for 4 days...    Then decrease to 1/2 tab every other day til gone (1/2, 0, 1/2, 0, etc)...  We also wrote for HYCODAN cough syrup- one tsp every 6H as needed for cough...  This time of year & the hot humid weather is rough- rec to stay in the air conditioning as much as poss...  Call for any questions...  Let's plan a follow up visit in 70mo, sooner if needed for problems.Marland KitchenMarland Kitchen

## 2016-06-30 NOTE — Progress Notes (Addendum)
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 750 MG TABLET    Take 750 mg by mouth at bedtime.   MONTELUKAST (SINGULAIR) 10 MG TABLET    Take 1 tablet by mouth daily.   OVER THE COUNTER MEDICATION    Take 1 tablet by mouth at bedtime. "midnight" dietary supplement   PRAVASTATIN (PRAVACHOL) 20 MG TABLET    Take 20 mg by mouth at bedtime.    SOLIFENACIN (VESICARE) 5 MG TABLET    Take 5 mg by mouth every evening.   Modified Medications   Modified Medication Previous Medication   HYDROCODONE-HOMATROPINE (HYCODAN) 5-1.5 MG/5ML SYRUP HYDROcodone-homatropine (HYCODAN) 5-1.5 MG/5ML syrup      Take 5 mLs by mouth every 6 (six) hours as needed for cough.    Take 5 mLs by mouth every 6 (six) hours as needed for cough.  Discontinued Medications   No medications on file

## 2016-07-30 ENCOUNTER — Ambulatory Visit (INDEPENDENT_AMBULATORY_CARE_PROVIDER_SITE_OTHER): Payer: Medicare Other | Admitting: Pulmonary Disease

## 2016-07-30 VITALS — BP 124/84 | HR 76 | Temp 98.0°F | Ht 69.0 in | Wt 204.5 lb

## 2016-07-30 DIAGNOSIS — J449 Chronic obstructive pulmonary disease, unspecified: Secondary | ICD-10-CM | POA: Diagnosis not present

## 2016-07-30 DIAGNOSIS — R911 Solitary pulmonary nodule: Secondary | ICD-10-CM

## 2016-07-30 DIAGNOSIS — J441 Chronic obstructive pulmonary disease with (acute) exacerbation: Secondary | ICD-10-CM | POA: Diagnosis not present

## 2016-07-30 DIAGNOSIS — Z8701 Personal history of pneumonia (recurrent): Secondary | ICD-10-CM

## 2016-07-30 DIAGNOSIS — K219 Gastro-esophageal reflux disease without esophagitis: Secondary | ICD-10-CM

## 2016-07-30 MED ORDER — HYDROCODONE-HOMATROPINE 5-1.5 MG/5ML PO SYRP: 5.0000 mL | ORAL_SOLUTION | Freq: Four times a day (QID) | ORAL | 0 refills | Status: DC | PRN

## 2016-07-30 MED ORDER — METHYLPREDNISOLONE 8 MG PO TABS: ORAL_TABLET | ORAL | 0 refills | Status: DC

## 2016-07-30 MED ORDER — METHYLPREDNISOLONE ACETATE 80 MG/ML IJ SUSP
80.0000 mg | Freq: Once | INTRAMUSCULAR | Status: AC
  Administered 2016-07-30: 80 mg via INTRAMUSCULAR

## 2016-07-30 NOTE — Patient Instructions (Signed)
Today we updated your med list in our EPIC system...    Continue your current medications the same...  Today we gave you a Depomedrol shot...  Starting in the AM 7/20 - take these MEDROL 8mg  tabs as follows>>    Start w/ one tab twice daily for 1 week...    Then decrease to 1 tab each AM for 1 week...    Then decrease to 1/2 tab each AM for 1 week...    Then decrease to 1/2 tab every other day til gone (1/2, 0, 1/2, 0, etc)...  Continue w/ your NEBULIZER using the Duoneb 3-4 times daily & the Budesonide twice daily... Continue the ADVAIR500 twice daily... Continue the Singulair10mg  daily... Continue the cough syrup every 6H as needed...  Be sure to drink plenty of fluids...  We also discussed a vigorous antireflux regimen>>    Take the Dexilant about 30 min before dinner in the eve...    NPO after dinner!    Elevate the head of your bed on 6" blocks...  Let's plan a follow up visit in about 6 weeks.Marland KitchenMarland Kitchen

## 2016-07-31 ENCOUNTER — Encounter: Payer: Self-pay | Admitting: Pulmonary Disease

## 2016-07-31 NOTE — Progress Notes (Signed)
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 daily as needed for rhinitis. Use in each nostril as directed   B COMPLEX VITAMINS TABLET    Take 1 tablet by mouth daily.   BUDESONIDE (PULMICORT) 0.5 MG/2ML NEBULIZER SOLUTION    Take 0.5 mg by nebulization at bedtime.    BUPROPION (WELLBUTRIN SR) 200 MG 12 HR TABLET    Take 200 mg by mouth 2 (two) times daily.   CALCIUM-MAGNESIUM PO    Take 1 tablet by mouth every evening.   CELECOXIB (CELEBREX) 200 MG CAPSULE    Take 200 mg by mouth daily.    CETIRIZINE (ZYRTEC) 10 MG TABLET    Take 10 mg by mouth daily.   DEXLANSOPRAZOLE (DEXILANT) 60 MG CAPSULE    Take 1 capsule (60 mg total) by mouth 2 (two) times daily.   DICLOFENAC SODIUM (VOLTAREN) 1 % GEL    Apply 2 g topically daily as needed.   DOCUSATE SODIUM (COLACE) 100 MG CAPSULE    Take 100 mg by mouth 2 (two) times daily.    DOXYLAMINE, SLEEP, (UNISOM) 25 MG TABLET    Take 12.5 mg by mouth at bedtime.   ELIDEL 1 % CREAM       FLUTICASONE (FLONASE) 50 MCG/ACT NASAL SPRAY    Place 2 sprays into both nostrils daily as needed for allergies or rhinitis.   FLUTICASONE-SALMETEROL (ADVAIR) 500-50 MCG/DOSE AEPB    Inhale 1  puff into the lungs 2 (two) times daily.    IPRATROPIUM (ATROVENT) 0.02 % NEBULIZER SOLUTION    NEBULIZE ONE VIAL EVERY 6-8 HOURS AS NEEDED. MAY MIX SOLUTION WITH ALBUTEROL.   IPRATROPIUM-ALBUTEROL (DUONEB) 0.5-2.5 (3) MG/3ML SOLN    Take 3 mLs by nebulization every 6 (six) hours as needed (shortness of breath).   METHOCARBAMOL (ROBAXIN) 750 MG TABLET    Take 750 mg by mouth at bedtime.   MONTELUKAST (SINGULAIR) 10 MG TABLET    Take 1 tablet by mouth daily.   OVER THE COUNTER MEDICATION    Take 1 tablet by mouth at bedtime. "midnight" dietary supplement   PRAVASTATIN (PRAVACHOL) 20 MG TABLET    Take 20 mg by mouth at bedtime.    PREDNISONE (DELTASONE) 20 MG TABLET    Take as directed by Dr. Lenna Gilford   SOLIFENACIN (VESICARE) 5 MG TABLET    Take 5 mg by mouth every evening.   Modified Medications   Modified Medication  Previous Medication   HYDROCODONE-HOMATROPINE (HYCODAN) 5-1.5 MG/5ML SYRUP HYDROcodone-homatropine (HYCODAN) 5-1.5 MG/5ML syrup      Take 5 mLs by mouth every 6 (six) hours as needed for cough.    Take 5 mLs by mouth every 6 (six) hours as needed for cough.  Discontinued Medications   No medications on file

## 2016-09-07 ENCOUNTER — Encounter: Payer: Self-pay | Admitting: Nurse Practitioner

## 2016-09-07 ENCOUNTER — Ambulatory Visit (INDEPENDENT_AMBULATORY_CARE_PROVIDER_SITE_OTHER): Payer: Medicare Other | Admitting: Nurse Practitioner

## 2016-09-07 VITALS — BP 121/75 | HR 91 | Temp 97.2°F | Ht 69.75 in | Wt 203.0 lb

## 2016-09-07 DIAGNOSIS — K219 Gastro-esophageal reflux disease without esophagitis: Secondary | ICD-10-CM

## 2016-09-07 MED ORDER — RABEPRAZOLE SODIUM 20 MG PO TBEC: 20.0000 mg | DELAYED_RELEASE_TABLET | Freq: Every day | ORAL | 2 refills | Status: DC

## 2016-09-07 NOTE — Progress Notes (Signed)
cc'ed to pcp °

## 2016-09-07 NOTE — Assessment & Plan Note (Signed)
The patient has been on Dexon for some time. In the past 4-6 weeks she is noted worsening dyspepsia symptoms including bitter sour taste, burning into her throat. Denies overt abdominal pain. Her pulmonologist thinks a lot of for wheezing and coughing is related to reflux. She does have a history of H. pylori. However, she is unable to undergo outpatient testing for H. pylori because she cannot stop her PPI for 2 weeks without becoming "very ill." At this point I'll have her stop Exelon and trial another agent and AcipHex 20 mg once a day. She can continue to use Rolaids and/or Mylanta as needed for breakthrough symptoms. She is to call with a progress report in 2-3 weeks. Return for follow-up in 6 weeks and at that point if she has been to a couple other agents without any significant improvement we can consider possible upper endoscopy to evaluate for recurrent H. Pylori.

## 2016-09-07 NOTE — Progress Notes (Signed)
Referring Provider: [name], MD Primary Care Physician:  [name], MD Primary GI:  Dr. Oneida Alar  Chief Complaint  Patient presents with  . Gastroesophageal Reflux    HPI:   Ellen Bowers is a 71 y.o. female who presents for reflux. The patient was last seen in our office 11/13/2015 for family history of colon cancer, personal history of adenomatous colon polyps, rectal pain. Patient has a self-reported history of von Willebrand's but has not seen hematology and 16 years. At the time of her last visit with [place] she noted intermittent rectal pain with a previous history of internal/external hemorrhoids and 2 anal fissures in January 2016 flexible sigmoidoscopy. She uses nitroglycerin ointment or Preparation H as needed. Occasional bright red blood per rectum. No other GI symptoms. Reflux was noted to be well controlled on Dexilant once daily. Recommended office visit with hematology but she declined this and assumed additional bleeding risk for recommended colonoscopy.  Colonoscopy completed 12/13/2015 which found 2 sessile polyps in the descending colon noted to be 4-5 mm in size, 3 sessile polyps in the rectum and descending colon noted to be 2-3 mm in size, internal hemorrhoids, nonthrombosed external hemorrhoids. Surgical pathology found 3 of her polyps to be adenomatous in 2 to be hyperplastic. Recommended repeat colonoscopies in 3 years with monitored anesthesia care.  Today she states she's ok overall. Reflux symptoms worsened about a month ago, can't lay down. Uses a wedge. Has persistent coughing 20-30 minutes after eating. Pulmonologist thinks a lot of her symptoms are due to reflux. She has been using Rolaids and Mylanta on top of Dexilant. She states she has been having worsening problems with COPD and thinks this may have been reflux symptoms. Reflux symptoms include bitter/sour taste in her mouth/throat, and "throat wheezing." Symptoms occur regardless of what she eats.  Denies dysphagia. Denies abdominal, vomiting. Does have intermittent, mild nausea. Denies hematochezia, melena. Denies fever, chills, unintentional weight loss. Denies chest pain, dyspnea, dizziness, lightheadedness, syncope, near syncope. Denies any other upper or lower GI symptoms.  Past Medical History:  Diagnosis Date  . Arthritis   . Asthma   . COPD (chronic obstructive pulmonary disease) ([place])   . Depression   . GERD (gastroesophageal reflux disease)   . Helicobacter pylori gastritis 2005  . Hyperlipidemia   . Von Willebrand disease ([place]) 1996    Past Surgical History:  Procedure Laterality Date  . CATARACT EXTRACTION     double  . COLONOSCOPY N/A 05/30/2012   Dr.Mann:Multiple polyps removed from the right colon-see description above; otherwise normal colonoscopy up to the cecum. 5 polyps, multiple tubular adenomas. next tcs 05/2015  . COLONOSCOPY N/A 12/13/2015   Procedure: COLONOSCOPY;  Surgeon: Danie Binder, MD;  Location: [facility];  Service: Endoscopy;  Laterality: N/A;  8:30 am  . ESOPHAGOGASTRODUODENOSCOPY  2012   Dr. Collene Mares: few erosions in duodenal bulb. BRAVO off PPI ("severe reflux")  . ESOPHAGOGASTRODUODENOSCOPY N/A 06/12/2014   SLF: 1. The mucosa of the esophagus appeared normal 2. Mild non-erosive gastrtitis.   [name] FLEXIBLE SIGMOIDOSCOPY N/A 02/05/2014   Dr. Oneida Alar: 5, 2-3 mm sessile sigmoid colon polyps removed, small internal hemorrhoids, moderate external hemorrhoids, 2 anal fissures present. Polyps were hyperplastic.   [name] NECK SURGERY  1951   cyst removal  . NECK SURGERY  1998   growth removed  . TONSILLECTOMY  1951  . TUBAL LIGATION  1984    Current Outpatient Prescriptions  Medication Sig Dispense Refill  . acetaminophen (  TYLENOL) 500 MG tablet Take 1,000 mg by mouth 2 (two) times daily as needed for moderate pain or headache.    [name] alum & mag hydroxide-simeth (MAALOX/MYLANTA) 200-200-20 MG/5ML suspension Take 15 mLs by mouth as needed for indigestion or  heartburn.    . Ascorbic Acid (VITAMIN C) 1000 MG tablet Take 1,000 mg by mouth daily.    [name] aspirin 81 MG tablet Take 81 mg by mouth daily.    [name] azelastine (ASTELIN) 0.1 % nasal spray Place 2 sprays into both nostrils daily as needed for rhinitis. Use in each nostril as directed    . b complex vitamins tablet Take 1 tablet by mouth daily.    . budesonide (PULMICORT) 0.5 MG/2ML nebulizer solution Take 0.5 mg by nebulization. Takes 1-2 times daily    . buPROPion (WELLBUTRIN SR) 200 MG 12 hr tablet Take 200 mg by mouth 2 (two) times daily.    . Ca Carbonate-Mag Hydroxide (ROLAIDS PO) Take 2 tablets by mouth as needed.    [name] CALCIUM-MAGNESIUM PO Take 1 tablet by mouth every evening.    . celecoxib (CELEBREX) 200 MG capsule Take 200 mg by mouth daily.     . cetirizine (ZYRTEC) 10 MG tablet Take 10 mg by mouth daily.    [name] dexlansoprazole (DEXILANT) 60 MG capsule Take 1 capsule (60 mg total) by mouth 2 (two) times daily. (Patient taking differently: Take 60 mg by mouth daily. ) 180 capsule 3  . diclofenac sodium (VOLTAREN) 1 % GEL Apply 2 g topically daily as needed. 2 Tube 0  . docusate sodium (COLACE) 100 MG capsule Take 100 mg by mouth 2 (two) times daily.     [name] doxylamine, Sleep, (UNISOM) 25 MG tablet Take 12.5 mg by mouth at bedtime.    . fluticasone (FLONASE) 50 MCG/ACT nasal spray Place 2 sprays into both nostrils daily as needed for allergies or rhinitis.    . Fluticasone-Salmeterol (ADVAIR) 500-50 MCG/DOSE AEPB Inhale 1 puff into the lungs 2 (two) times daily.     [name] HYDROcodone-homatropine (HYCODAN) 5-1.5 MG/5ML syrup Take 5 mLs by mouth every 6 (six) hours as needed for cough. 120 mL 0  . ipratropium-albuterol (DUONEB) 0.5-2.5 (3) MG/3ML SOLN Take 3 mLs by nebulization every 6 (six) hours as needed (shortness of breath).    [name] ipratropium-albuterol (DUONEB) 0.5-2.5 (3) MG/3ML SOLN Take 3 mLs by nebulization.    . montelukast (SINGULAIR) 10 MG tablet Take 1 tablet by mouth daily.    [name] OVER THE  COUNTER MEDICATION Take 1 tablet by mouth at bedtime. "midnight" dietary supplement    . pravastatin (PRAVACHOL) 20 MG tablet Take 20 mg by mouth at bedtime.     . solifenacin (VESICARE) 5 MG tablet Take 5 mg by mouth every evening.      No current facility-administered medications for this visit.     Allergies as of 09/07/2016 - Review Complete 09/07/2016  Allergen Reaction Noted  . Demerol [meperidine] Nausea And Vomiting 06/12/2014  . Betadine [povidone iodine] Rash 05/30/2012    Family History  Problem Relation Age of Onset  . Colon cancer Mother        Less than age 84  . Colon cancer Father        More than age 72, died from metastatic disease at age 51  . Liver disease Neg Hx     Social History   Social History  . Marital status: Married    Spouse name: N/A  . Number of children:  N/A  . Years of education: N/A   Social History Main Topics  . Smoking status: Former Smoker    Packs/day: 1.00    Years: 50.00    Types: Cigarettes    Start date: 03/17/1961    Quit date: 05/30/2009  . Smokeless tobacco: Never Used     Comment: Quit smoking x 5 years  . Alcohol use Yes     Comment: Occasionally    . Drug use: No  . Sexual activity: Not Asked   Other Topics Concern  . None   Social History Narrative  . None    Review of Systems: General: Negative for anorexia, weight loss, fever, chills, fatigue, weakness. ENT: Negative for hoarseness, difficulty swallowing. CV: Negative for chest pain, angina, palpitations, peripheral edema.  Respiratory: Negative for dyspnea at rest, sputum.  GI: See history of present illness. Endo: Negative for unusual weight change.  Heme: Negative for bruising or bleeding. Allergy: Negative for rash or hives.   Physical Exam: BP 121/75   Pulse 91   Temp (!) 97.2 F (36.2 C) (Oral)   Ht 5' 9.75" (1.772 m)   Wt 203 lb (92.1 kg)   BMI 29.34 kg/m  General:   Alert and oriented. Pleasant and cooperative. Well-nourished and  well-developed.  Eyes:  Without icterus, sclera clear and conjunctiva pink.  Ears:  Normal auditory acuity. Cardiovascular:  S1, S2 present without murmurs appreciated.  Extremities without clubbing or edema. Respiratory:  Noted end-espiratory wheezes bilateral bases. No rales or rhonchi. No distress.  Gastrointestinal:  +BS, soft, non-tender and non-distended. No guarding or rebound. No masses appreciated.  Rectal:  Deferred  Musculoskalatal:  Symmetrical without gross deformities. Neurologic:  Alert and oriented x4;  grossly normal neurologically. Psych:  Alert and cooperative. Normal mood and affect. Heme/Lymph/Immune: No excessive bruising noted.    09/07/2016 9:22 AM   Disclaimer: This note was dictated with voice recognition software. Similar sounding words can inadvertently be transcribed and may not be corrected upon review.

## 2016-09-07 NOTE — Patient Instructions (Signed)
1. Stop taking Dexilant 2. Start Aciphex 20 mg once daily 3. Call us in 2-3 weeks and let us know if it helps 4. You can continue to use Rolaids and/or Mylanta as needed 5. Return for follow-up in 6 weeks

## 2016-09-10 ENCOUNTER — Ambulatory Visit (INDEPENDENT_AMBULATORY_CARE_PROVIDER_SITE_OTHER): Payer: Medicare Other | Admitting: Pulmonary Disease

## 2016-09-10 ENCOUNTER — Encounter: Payer: Self-pay | Admitting: Pulmonary Disease

## 2016-09-10 VITALS — BP 128/74 | HR 83 | Temp 98.2°F | Ht 69.0 in | Wt 204.5 lb

## 2016-09-10 DIAGNOSIS — Z8701 Personal history of pneumonia (recurrent): Secondary | ICD-10-CM

## 2016-09-10 DIAGNOSIS — R911 Solitary pulmonary nodule: Secondary | ICD-10-CM

## 2016-09-10 DIAGNOSIS — J449 Chronic obstructive pulmonary disease, unspecified: Secondary | ICD-10-CM | POA: Diagnosis not present

## 2016-09-10 DIAGNOSIS — K219 Gastro-esophageal reflux disease without esophagitis: Secondary | ICD-10-CM

## 2016-09-10 MED ORDER — PANTOPRAZOLE SODIUM 40 MG PO TBEC: 40.0000 mg | DELAYED_RELEASE_TABLET | Freq: Two times a day (BID) | ORAL | 6 refills | Status: DC

## 2016-09-10 MED ORDER — HYDROCODONE-HOMATROPINE 5-1.5 MG/5ML PO SYRP: 5.0000 mL | ORAL_SOLUTION | Freq: Four times a day (QID) | ORAL | 0 refills | Status: DC | PRN

## 2016-09-10 MED ORDER — IPRATROPIUM-ALBUTEROL 0.5-2.5 (3) MG/3ML IN SOLN: 3.0000 mL | Freq: Three times a day (TID) | RESPIRATORY_TRACT | 11 refills | Status: DC

## 2016-09-10 MED ORDER — UMECLIDINIUM BROMIDE 62.5 MCG/INH IN AEPB: 1.0000 | INHALATION_SPRAY | Freq: Every day | RESPIRATORY_TRACT | 6 refills | Status: DC

## 2016-09-10 NOTE — Patient Instructions (Signed)
Today we updated your med list in our EPIC system...     We decided to adjust your NEBULIZER to use the DUONEB medication 3 times daily every day...    Followed by the ADVAIR500 twice daily & the new INCRUSE once daily as we discussed...  We refilled your Hycodan cough syrup for as needed use...  I would like you to MAXIMIZE your anti-reflux regimen>>    Change the Aciphex (you were taking it too early in the AM) to PROTONIX (Pantoprazole) 40mg  tabs taking one tab twice dailt- 30 min before the 1st & last meals of the day (breakfast & dinner)...    Elevate the head of your be at least 6" on blocks or in the recliner...    Do not eat or drink much after dinner in the eve (so your stomach will be empty by the time you lie down)  You may need further testing from your GI> to r/o a hiatus hernia, severe reflux, etc...  Call for any questions...  Let's plan a follow up visit in 2-29mo, sooner if needed for breathing problems.Marland KitchenMarland Kitchen
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
## 2016-09-16 ENCOUNTER — Ambulatory Visit: Payer: Medicare Other | Admitting: Gastroenterology

## 2016-09-18 ENCOUNTER — Telehealth: Payer: Self-pay | Admitting: Pulmonary Disease

## 2016-09-18 MED ORDER — IPRATROPIUM-ALBUTEROL 0.5-2.5 (3) MG/3ML IN SOLN: 3.0000 mL | Freq: Three times a day (TID) | RESPIRATORY_TRACT | 11 refills | Status: AC

## 2016-09-18 NOTE — Telephone Encounter (Signed)
rx has been sent to the pharmacy with the dx code on the rx.

## 2016-10-12 ENCOUNTER — Ambulatory Visit: Payer: Medicare Other | Admitting: Gastroenterology

## 2016-10-20 ENCOUNTER — Ambulatory Visit: Payer: Medicare Other | Admitting: Gastroenterology

## 2016-10-23 ENCOUNTER — Ambulatory Visit (INDEPENDENT_AMBULATORY_CARE_PROVIDER_SITE_OTHER): Payer: Medicare Other | Admitting: Orthopaedic Surgery

## 2016-10-23 ENCOUNTER — Encounter (INDEPENDENT_AMBULATORY_CARE_PROVIDER_SITE_OTHER): Payer: Self-pay | Admitting: Orthopaedic Surgery

## 2016-10-23 VITALS — BP 154/71 | HR 81 | Resp 16 | Ht 69.5 in | Wt 201.0 lb

## 2016-10-23 DIAGNOSIS — M1711 Unilateral primary osteoarthritis, right knee: Secondary | ICD-10-CM | POA: Diagnosis not present

## 2016-10-23 MED ORDER — LIDOCAINE HCL 1 % IJ SOLN
5.0000 mL | INTRAMUSCULAR | Status: AC | PRN
  Administered 2016-10-23: 5 mL

## 2016-10-23 MED ORDER — BUPIVACAINE HCL 0.5 % IJ SOLN
3.0000 mL | INTRAMUSCULAR | Status: AC | PRN
  Administered 2016-10-23: 3 mL via INTRA_ARTICULAR

## 2016-10-23 MED ORDER — METHYLPREDNISOLONE ACETATE 40 MG/ML IJ SUSP
80.0000 mg | INTRAMUSCULAR | Status: AC | PRN
  Administered 2016-10-23: 80 mg

## 2016-10-23 NOTE — Progress Notes (Signed)
Office Visit Note   Patient: Ellen Bowers           Date of Birth: [birth year]           MRN: [id number] Visit Date: 10/23/2016              Requested by: [name], MD [address] [place], [place] [address] PCP: [name], MD   Assessment & Plan: Visit Diagnoses:  1. Unilateral primary osteoarthritis, right knee     Plan: Cortisone injection right knee, follow-up as needed. Have had prior discussions regarding different treatment options including knee replacement  Follow-Up Instructions: Return if symptoms worsen or fail to improve.   Orders:  No orders of the defined types were placed in this encounter.  No orders of the defined types were placed in this encounter.     Procedures: Large Joint Inj Date/Time: 10/23/2016 8:40 AM Performed by: Garald Balding Authorized by: Garald Balding   Consent Given by:  Patient Timeout: prior to procedure the correct patient, procedure, and site was verified   Indications:  Pain and joint swelling Location:  Knee Site:  R knee Prep: patient was prepped and draped in usual sterile fashion   Needle Size:  25 G Needle Length:  1.5 inches Approach:  Anteromedial Ultrasound Guidance: No   Fluoroscopic Guidance: No   Arthrogram: No   Medications:  5 mL lidocaine 1 %; 80 mg methylPREDNISolone acetate 40 MG/ML; 3 mL bupivacaine 0.5 % Aspiration Attempted: No   Patient tolerance:  Patient tolerated the procedure well with no immediate complications     Clinical Data: No additional findings.   Subjective: No chief complaint on file. Mrs. Ellen Bowers is coming by her husband here for follow-up evaluation of the osteoarthritis of her right knee. The arthritis is essentially end-stage particularly in the lateral compartment. There are tricompartmental changes. Her last cortisone injection was in 71. She is having some recurrent pain would like to consider another cortisone injection. No history of injury or  trauma.  HPI  Review of Systems   Objective: Vital Signs: BP (!) 154/71 (BP Location: Right Arm, Patient Position: Sitting, Cuff Size: Normal)   Pulse 81   Resp 16   Ht 5' 9.5" (1.765 m)   Wt 201 lb (91.2 kg)   BMI 29.26 kg/m   Physical Exam  Ortho Exam awake alert and oriented 3. Comfortable sitting. No limp with ambulation. Right knee was not effused. Increased valgus with weightbearing. Predominantly lateral joint pain but was some discomfort beneath the patella with crepitation. Some popliteal discomfort but without a mass. No distal edema. Neurovascular exam intact. Straight leg raise negative  Specialty Comments:  No specialty comments available.  Imaging: No results found.   PMFS History: Patient Active Problem List   Diagnosis Date Noted  . COPD with acute exacerbation ([place]) 06/30/2016  . Anorectal fissure 10/24/2014  . Family history of colorectal cancer 10/24/2014  . History of colon polyps 10/24/2014  . History of pneumonia 06/07/2014  . COPD mixed type ([place]) 06/07/2014  . Pulmonary nodule 06/07/2014  . Abdominal pain, epigastric 05/07/2014  . Hemorrhoids 05/07/2014  . Rectal bleeding   . Rectal pain   . Rectal pain, chronic 12/28/2013  . GERD (gastroesophageal reflux disease) 09/06/2013  . Chronic cough 09/06/2013   Past Medical History:  Diagnosis Date  . Arthritis   . Asthma   . COPD (chronic obstructive pulmonary disease) ([place])   . Depression   . GERD (gastroesophageal reflux  disease)   . Helicobacter pylori gastritis 2005  . Hyperlipidemia   . Von Willebrand disease ([place]) 1996    Family History  Problem Relation Age of Onset  . Colon cancer Mother        Less than age 34  . Colon cancer Father        More than age 69, died from metastatic disease at age 30  . Liver disease Neg Hx     Past Surgical History:  Procedure Laterality Date  . BREAST LUMPECTOMY    . CATARACT EXTRACTION     double  . COLONOSCOPY N/A 05/30/2012    Dr.Mann:Multiple polyps removed from the right colon-see description above; otherwise normal colonoscopy up to the cecum. 5 polyps, multiple tubular adenomas. next tcs 05/2015  . COLONOSCOPY N/A 12/13/2015   Procedure: COLONOSCOPY;  Surgeon: Danie Binder, MD;  Location: [facility];  Service: Endoscopy;  Laterality: N/A;  8:30 am  . ESOPHAGOGASTRODUODENOSCOPY  2012   Dr. Collene Mares: few erosions in duodenal bulb. BRAVO off PPI ("severe reflux")  . ESOPHAGOGASTRODUODENOSCOPY N/A 06/12/2014   SLF: 1. The mucosa of the esophagus appeared normal 2. Mild non-erosive gastrtitis.   [name] FLEXIBLE SIGMOIDOSCOPY N/A 02/05/2014   Dr. Oneida Alar: 5, 2-3 mm sessile sigmoid colon polyps removed, small internal hemorrhoids, moderate external hemorrhoids, 2 anal fissures present. Polyps were hyperplastic.   [name] NECK SURGERY  1951   cyst removal  . NECK SURGERY  1998   growth removed  . TONSILLECTOMY  1951  . TUBAL LIGATION  1984   Social History   Occupational History  . Not on file.   Social History Main Topics  . Smoking status: Former Smoker    Packs/day: 1.00    Years: 50.00    Types: Cigarettes    Start date: 03/17/1961    Quit date: 05/30/2009  . Smokeless tobacco: Never Used     Comment: Quit smoking x 5 years  . Alcohol use Yes     Comment: Occasionally    . Drug use: No  . Sexual activity: Not on file     Garald Balding, MD   Note - This record has been created using [employer].  Chart creation errors have been sought, but may not always  have been located. Such creation errors do not reflect on  the standard of medical care.

## 2016-10-28 ENCOUNTER — Ambulatory Visit: Payer: Medicare Other | Admitting: Nurse Practitioner

## 2016-10-28 ENCOUNTER — Ambulatory Visit: Payer: Medicare Other | Admitting: Gastroenterology

## 2016-11-02 ENCOUNTER — Ambulatory Visit: Payer: Medicare Other | Admitting: Pulmonary Disease

## 2016-11-18 ENCOUNTER — Encounter: Payer: Self-pay | Admitting: Gastroenterology

## 2016-11-18 ENCOUNTER — Ambulatory Visit: Payer: Medicare Other | Admitting: Gastroenterology

## 2016-11-18 DIAGNOSIS — K219 Gastro-esophageal reflux disease without esophagitis: Secondary | ICD-10-CM

## 2016-11-18 NOTE — Progress Notes (Signed)
Subjective:    Patient ID: Ellen Bowers, female    DOB: 1945-09-12, 71 y.o.   MRN: [id number]  [name], MD   HPI 2012Franki Bowers for SEVERE REFLUX. SYMPTOMS FAIRLY WELL CONTROLLED UNTIL SUMMER 2018. Can't LAY FLAT AT NIGHT. SLEEPS IN A RECLINER. SLEPT WITH A WEDGE AT HER DAUGHTERS BUT IT AGGRAVATED HER HEMORRHOIDS. HEARTBURN OUT OF CONTROL SINCE SUMMER 2018. 15-20 MINS AFTER LUNCH HAS A COUGH. THE COUGH GOES AWAY IF SHE TAKES MAALOX. DOESN'T THINKS IT'S HER LUNG PROBLEM. REFLUX INCLUDE: COUGH AFTER EATING, ACID TASTE IN BACK OF THROAT. GETS SOB WITH EXERTION BUT NOT WITH THE COUGH. BEFORE SHE STARTED HER CURRENT REGIMEN THEN SHE HAD SYMPTOMS WER WAKING HER UP EVERY NIGHT. MAAALOX WORKS FAST.   BMS: EVERY DAY: 1-2X, NL. IF DOESN'T EAT RIGHT OR TAKE STOOL SOFTENERS THEN IT'S GOOD.   PT DENIES FEVER, CHILLS, HEMATOCHEZIA, HEMATEMESIS, nausea, vomiting, melena, diarrhea, CHEST PAIN, SHORTNESS OF BREATH,  CHANGE IN BOWEL IN HABITS, abdominal pain, problems swallowing. Problems with sedation: WOKE UP WITH COLONOSCOPY(PH 12.5 MG IV, FENTANYL 150 MCV IV, VERSED 9 MG IV).  Past Medical History:  Diagnosis Date  . Arthritis   . Asthma   . COPD (chronic obstructive pulmonary disease) ([place])   . Depression   . GERD (gastroesophageal reflux disease)   . Helicobacter pylori gastritis 2005  . Hyperlipidemia   . Von Willebrand disease ([place]) 1996   Past Surgical History:  Procedure Laterality Date  . BREAST LUMPECTOMY    . CATARACT EXTRACTION     double  . ESOPHAGOGASTRODUODENOSCOPY  2012   Dr. Collene Mares: few erosions in duodenal bulb. BRAVO off PPI ("severe reflux")  . NECK SURGERY  1951   cyst removal  . NECK SURGERY  1998   growth removed  . TONSILLECTOMY  1951  . TUBAL LIGATION  1984   Allergies  Allergen Reactions  . Demerol [Meperidine] Nausea And Vomiting  . Betadine [Povidone Iodine] Rash    Topical only    Current Outpatient Medications  Medication Sig Dispense Refill  .  acetaminophen (TYLENOL) 500 MG tablet Take 1,000 mg by mouth 2 (two) times daily as needed for moderate pain or headache.    [name] alum & mag hydroxide-simeth (MAALOX/MYLANTA) 200-200-20 MG/5ML suspension Take 15 mLs by mouth as needed for indigestion or heartburn.    . Ascorbic Acid (VITAMIN C) 1000 MG tablet Take 1,000 mg by mouth daily.    [name] aspirin 81 MG tablet Take 81 mg by mouth daily.    [name] azelastine (ASTELIN) 0.1 % nasal spray Place 2 sprays into both nostrils daily as needed for rhinitis. Use in each nostril as directed    . b complex vitamins tablet Take 1 tablet by mouth daily.    . budesonide (PULMICORT) 0.5 MG/2ML nebulizer solution Take 0.5 mg by nebulization. Takes 1-2 times daily    . buPROPion (WELLBUTRIN SR) 200 MG 12 hr tablet Take 200 mg by mouth 2 (two) times daily.    . Ca Carbonate-Mag Hydroxide (ROLAIDS PO) Take 2 tablets by mouth as needed.    [name] CALCIUM-MAGNESIUM PO Take 1 tablet by mouth every evening.    . celecoxib (CELEBREX) 200 MG capsule Take 200 mg by mouth daily.     . cetirizine (ZYRTEC) 10 MG tablet Take 10 mg by mouth daily.    . diclofenac sodium (VOLTAREN) 1 % GEL Apply 2 g topically daily as needed.    . docusate sodium (COLACE) 100 MG capsule Take  100 mg by mouth 2 (two) times daily.     [name] doxylamine, Sleep, (UNISOM) 25 MG tablet Take 12.5 mg by mouth at bedtime.    . fluticasone (FLONASE) 50 MCG/ACT nasal spray Place 2 sprays into both nostrils daily as needed for allergies or rhinitis.    . Fluticasone-Salmeterol (ADVAIR) 500-50 MCG/DOSE AEPB Inhale 1 puff into the lungs 2 (two) times daily.     [name] HYDROcodone-homatropine (HYCODAN) 5-1.5 MG/5ML syrup Take 5 mLs by mouth every 6 (six) hours as needed for cough.    [name] ipratropium-albuterol (DUONEB) 0.5-2.5 (3) MG/3ML SOLN Take 3 mLs by nebulization 3 (three) times daily.    . montelukast (SINGULAIR) 10 MG tablet Take 1 tablet by mouth daily.    [name] OVER THE COUNTER MEDICATION Take 1 tablet by mouth at bedtime.  "midnight" dietary supplement    . pantoprazole (PROTONIX) 40 MG tablet Take 1 tablet (40 mg total) by mouth 2 (two) times daily. 30 minutes before breakfast and dinner    . pravastatin (PRAVACHOL) 20 MG tablet Take 20 mg by mouth at bedtime.     . ranitidine (ZANTAC) 150 MG tablet Take 150 mg at bedtime as needed by mouth for heartburn.    . solifenacin (VESICARE) 5 MG tablet Take 5 mg by mouth every evening.     . umeclidinium bromide (INCRUSE ELLIPTA) 62.5 MCG/INH AEPB Inhale 1 puff into the lungs daily.     Review of Systems PER HPI OTHERWISE ALL SYSTEMS ARE NEGATIVE.    Objective:   Physical Exam  Constitutional: She is oriented to person, place, and time. She appears well-developed and well-nourished. No distress.  HENT:  Head: Normocephalic and atraumatic.  Mouth/Throat: Oropharynx is clear and moist. No oropharyngeal exudate.  Eyes: Pupils are equal, round, and reactive to light. No scleral icterus.  Neck: Normal range of motion. Neck supple.  Cardiovascular: Normal rate, regular rhythm and normal heart sounds.  Pulmonary/Chest: Effort normal and breath sounds normal. No respiratory distress.  Abdominal: Soft. Bowel sounds are normal. She exhibits no distension. There is no tenderness.  Musculoskeletal: She exhibits no edema.  Lymphadenopathy:    She has no cervical adenopathy.  Neurological: She is alert and oriented to person, place, and time.  NO  NEW FOCAL DEFICITS  Psychiatric:  SLIGHTLY ANXIOUS MOOD, FLAT AFFECT   Vitals reviewed.     Assessment & Plan:

## 2016-11-18 NOTE — Patient Instructions (Addendum)
AVOID REFLUX TRIGGERS. SEE INFO BELOW.  CONTINUE PROTONIX. TAKE 30 MINUTES PRIOR TO MEALS TWICE DAILY.  USE MAALOX AND ZANTAC IF NEEDED TO CONTROL SYMPTOMS.   COMPLETE EGD WITH BRAVO STUDY WITH PROPOFOL FOR SEDATION WITHIN THE NEXT 3-4 WEEKS. IF UNCONTROLLED REFLUX IS CONFIRMED YOU WILL NEED A SURGERY REFERRAL FOR A NISSEN FUNDOPLICATION.  FOLLOW UP IN 4 MOS.     Lifestyle and home remedies TO CONTROL REFLUX AND REGURGITATION.  You may eliminate or reduce the frequency of heartburn by making the following lifestyle changes:  . Control your weight. Being overweight is a major risk factor for heartburn and GERD. Excess pounds put pressure on your abdomen, pushing up your stomach and causing acid to back up into your esophagus.   . Eat smaller meals. 4 TO 6 MEALS A DAY. This reduces pressure on the lower esophageal sphincter, helping to prevent the valve from opening and acid from washing back into your esophagus.   Dolphus Jenny your belt. Clothes that fit tightly around your waist put pressure on your abdomen and the lower esophageal sphincter.  . Eliminate heartburn triggers. Everyone has specific triggers.  .  Common triggers such as fatty or fried foods, spicy food, tomato sauce, carbonated beverages, alcohol, chocolate, mint, garlic, onion, caffeine and nicotine may make heartburn worse.   [name] Avoid stooping or bending. Tying your shoes is OK. Bending over for longer periods to weed your garden isn't, especially soon after eating.   . Don't lie down after a meal. Wait at least three to four hours after eating before going to bed, and don't lie down right after eating.   Alternative medicine . Several home remedies exist for treating GERD, but they provide only temporary relief. They include drinking baking soda (sodium bicarbonate) added to water or drinking other fluids such as baking soda mixed with cream of tartar and water. . Although these liquids create temporary relief by  neutralizing, washing away or buffering acids, eventually they aggravate the situation by adding gas and fluid to your stomach, increasing pressure and causing more acid reflux. Further, adding more sodium to your diet may increase your blood pressure and add stress to your heart, and excessive bicarbonate ingestion can alter the acid-base balance in your body.

## 2016-11-18 NOTE — Assessment & Plan Note (Addendum)
SYMPTOMS NOT CONTROLLED ON PROTONIX BID AND MAALOX/ZANTAC PRN. WEIGHT UNCHANGED SINCE 2016: 199 LBS.  CONTINUE PROTONIX. TAKE 30 MINUTES PRIOR TO MEALS TWICE DAILY. MAALOX AND ZANTAC IF NEEDED TO CONTROL SYMPTOMS. EGD WITH BRAVO STUDY W/ MAC DUE TO POLYPHARMACY/PT FAILED CONSCIOUS SEDATION. DISCUSSED PROCEDURE, BENEFITS, & RISKS: < 1% chance of medication reaction, bleeding, OR perforation. PT MAY NEED A NISSEN FUNDOPLICATION. FOLLOW UP IN 4 MOS.   GREATER THAN 50% WAS SPENT IN COUNSELING & COORDINATION OF CARE WITH THE PATIENT: DISCUSSED PROCEDURE, BENEFITS, RISKS, AND MANAGEMENT OF UNCONTROLLED REFLUX. TOTAL ENCOUNTER TIME: 40 MINS.

## 2016-11-18 NOTE — H&P (View-Only) (Signed)
Subjective:    Patient ID: Ellen Bowers, female    DOB: 1945/04/10, 71 y.o.   MRN: [id number]  [name], MD   HPI 2012[name] for SEVERE REFLUX. SYMPTOMS FAIRLY WELL CONTROLLED UNTIL SUMMER 2018. Can't LAY FLAT AT NIGHT. SLEEPS IN A RECLINER. SLEPT WITH A WEDGE AT HER DAUGHTERS BUT IT AGGRAVATED HER HEMORRHOIDS. HEARTBURN OUT OF CONTROL SINCE SUMMER 2018. 15-20 MINS AFTER LUNCH HAS A COUGH. THE COUGH GOES AWAY IF SHE TAKES MAALOX. DOESN'T THINKS IT'S HER LUNG PROBLEM. REFLUX INCLUDE: COUGH AFTER EATING, ACID TASTE IN BACK OF THROAT. GETS SOB WITH EXERTION BUT NOT WITH THE COUGH. BEFORE SHE STARTED HER CURRENT REGIMEN THEN SHE HAD SYMPTOMS WER WAKING HER UP EVERY NIGHT. MAAALOX WORKS FAST.   BMS: EVERY DAY: 1-2X, NL. IF DOESN'T EAT RIGHT OR TAKE STOOL SOFTENERS THEN IT'S GOOD.   PT DENIES FEVER, CHILLS, HEMATOCHEZIA, HEMATEMESIS, nausea, vomiting, melena, diarrhea, CHEST PAIN, SHORTNESS OF BREATH,  CHANGE IN BOWEL IN HABITS, abdominal pain, problems swallowing. Problems with sedation: WOKE UP WITH COLONOSCOPY(PH 12.5 MG IV, FENTANYL 150 MCV IV, VERSED 9 MG IV).  Past Medical History:  Diagnosis Date  . Arthritis   . Asthma   . COPD (chronic obstructive pulmonary disease) ([place])   . Depression   . GERD (gastroesophageal reflux disease)   . Helicobacter pylori gastritis 2005  . Hyperlipidemia   . Von Willebrand disease ([place]) 1996   Past Surgical History:  Procedure Laterality Date  . BREAST LUMPECTOMY    . CATARACT EXTRACTION     double  . ESOPHAGOGASTRODUODENOSCOPY  2012   Dr. Collene Mares: few erosions in duodenal bulb. BRAVO off PPI ("severe reflux")  . NECK SURGERY  1951   cyst removal  . NECK SURGERY  1998   growth removed  . TONSILLECTOMY  1951  . TUBAL LIGATION  1984   Allergies  Allergen Reactions  . Demerol [Meperidine] Nausea And Vomiting  . Betadine [Povidone Iodine] Rash    Topical only    Current Outpatient Medications  Medication Sig Dispense Refill  .  acetaminophen (TYLENOL) 500 MG tablet Take 1,000 mg by mouth 2 (two) times daily as needed for moderate pain or headache.    [name] alum & mag hydroxide-simeth (MAALOX/MYLANTA) 200-200-20 MG/5ML suspension Take 15 mLs by mouth as needed for indigestion or heartburn.    . Ascorbic Acid (VITAMIN C) 1000 MG tablet Take 1,000 mg by mouth daily.    [name] aspirin 81 MG tablet Take 81 mg by mouth daily.    [name] azelastine (ASTELIN) 0.1 % nasal spray Place 2 sprays into both nostrils daily as needed for rhinitis. Use in each nostril as directed    . b complex vitamins tablet Take 1 tablet by mouth daily.    . budesonide (PULMICORT) 0.5 MG/2ML nebulizer solution Take 0.5 mg by nebulization. Takes 1-2 times daily    . buPROPion (WELLBUTRIN SR) 200 MG 12 hr tablet Take 200 mg by mouth 2 (two) times daily.    . Ca Carbonate-Mag Hydroxide (ROLAIDS PO) Take 2 tablets by mouth as needed.    [name] CALCIUM-MAGNESIUM PO Take 1 tablet by mouth every evening.    . celecoxib (CELEBREX) 200 MG capsule Take 200 mg by mouth daily.     . cetirizine (ZYRTEC) 10 MG tablet Take 10 mg by mouth daily.    . diclofenac sodium (VOLTAREN) 1 % GEL Apply 2 g topically daily as needed.    . docusate sodium (COLACE) 100 MG capsule Take  100 mg by mouth 2 (two) times daily.     [name] doxylamine, Sleep, (UNISOM) 25 MG tablet Take 12.5 mg by mouth at bedtime.    . fluticasone (FLONASE) 50 MCG/ACT nasal spray Place 2 sprays into both nostrils daily as needed for allergies or rhinitis.    . Fluticasone-Salmeterol (ADVAIR) 500-50 MCG/DOSE AEPB Inhale 1 puff into the lungs 2 (two) times daily.     [name] HYDROcodone-homatropine (HYCODAN) 5-1.5 MG/5ML syrup Take 5 mLs by mouth every 6 (six) hours as needed for cough.    [name] ipratropium-albuterol (DUONEB) 0.5-2.5 (3) MG/3ML SOLN Take 3 mLs by nebulization 3 (three) times daily.    . montelukast (SINGULAIR) 10 MG tablet Take 1 tablet by mouth daily.    [name] OVER THE COUNTER MEDICATION Take 1 tablet by mouth at bedtime.  "midnight" dietary supplement    . pantoprazole (PROTONIX) 40 MG tablet Take 1 tablet (40 mg total) by mouth 2 (two) times daily. 30 minutes before breakfast and dinner    . pravastatin (PRAVACHOL) 20 MG tablet Take 20 mg by mouth at bedtime.     . ranitidine (ZANTAC) 150 MG tablet Take 150 mg at bedtime as needed by mouth for heartburn.    . solifenacin (VESICARE) 5 MG tablet Take 5 mg by mouth every evening.     . umeclidinium bromide (INCRUSE ELLIPTA) 62.5 MCG/INH AEPB Inhale 1 puff into the lungs daily.     Review of Systems PER HPI OTHERWISE ALL SYSTEMS ARE NEGATIVE.    Objective:   Physical Exam  Constitutional: She is oriented to person, place, and time. She appears well-developed and well-nourished. No distress.  HENT:  Head: Normocephalic and atraumatic.  Mouth/Throat: Oropharynx is clear and moist. No oropharyngeal exudate.  Eyes: Pupils are equal, round, and reactive to light. No scleral icterus.  Neck: Normal range of motion. Neck supple.  Cardiovascular: Normal rate, regular rhythm and normal heart sounds.  Pulmonary/Chest: Effort normal and breath sounds normal. No respiratory distress.  Abdominal: Soft. Bowel sounds are normal. She exhibits no distension. There is no tenderness.  Musculoskeletal: She exhibits no edema.  Lymphadenopathy:    She has no cervical adenopathy.  Neurological: She is alert and oriented to person, place, and time.  NO  NEW FOCAL DEFICITS  Psychiatric:  SLIGHTLY ANXIOUS MOOD, FLAT AFFECT   Vitals reviewed.     Assessment & Plan:

## 2016-11-19 ENCOUNTER — Other Ambulatory Visit: Payer: Self-pay

## 2016-11-19 ENCOUNTER — Telehealth: Payer: Self-pay

## 2016-11-19 ENCOUNTER — Encounter: Payer: Self-pay | Admitting: Pulmonary Disease

## 2016-11-19 ENCOUNTER — Ambulatory Visit: Payer: Medicare Other | Admitting: Pulmonary Disease

## 2016-11-19 VITALS — BP 120/70 | HR 74 | Temp 98.4°F | Ht 69.0 in | Wt 201.4 lb

## 2016-11-19 DIAGNOSIS — Z8701 Personal history of pneumonia (recurrent): Secondary | ICD-10-CM | POA: Diagnosis not present

## 2016-11-19 DIAGNOSIS — J449 Chronic obstructive pulmonary disease, unspecified: Secondary | ICD-10-CM

## 2016-11-19 DIAGNOSIS — R0602 Shortness of breath: Secondary | ICD-10-CM | POA: Diagnosis not present

## 2016-11-19 DIAGNOSIS — R1013 Epigastric pain: Secondary | ICD-10-CM

## 2016-11-19 DIAGNOSIS — R911 Solitary pulmonary nodule: Secondary | ICD-10-CM | POA: Diagnosis not present

## 2016-11-19 DIAGNOSIS — K219 Gastro-esophageal reflux disease without esophagitis: Secondary | ICD-10-CM | POA: Diagnosis not present

## 2016-11-19 NOTE — Telephone Encounter (Signed)
Pt called office. EGD/Bravo Capsule Placement scheduled for 12/08/16 at 7:30am. Orders entered. Will mail instructions after pre-op appt is scheduled.

## 2016-11-19 NOTE — Patient Instructions (Signed)
Today we updated your med list in our EPIC system...    Continue your current medications the same...  We discussed continuing the NEBULIZER w/ Duoneb 2-3 times daily followed by the [place]...    You can back off on the Budesonide for the nebulizer to just as needed...  We will arrange for a follow up CT Chest to be done at [facility] to compare to the prev scans...    We will contact you w/ the results when available...   Keep up the good work w/ the anti-reflux regimen to help your GERD & cough...  Call for any questions...  Let's plan a follow up visit in 20mo, sooner if needed for problems.Marland KitchenMarland Kitchen

## 2016-11-19 NOTE — Telephone Encounter (Signed)
Tried to call pt to schedule EGD/Bravo Capsule Placement w/Propofol w/SLF. No answer, LMOVM and LMOAM.

## 2016-11-19 NOTE — Telephone Encounter (Signed)
Called and informed pt of pre-op appt 12/02/16 at 12:45pm. Letter mailed with procedure instructions.
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
## 2016-11-19 NOTE — Telephone Encounter (Signed)
PA info for EGD/Bravo Capsule placement submitted via [facility] website. No PA needed. Decision ID# [id number].

## 2016-11-24 ENCOUNTER — Ambulatory Visit: Payer: Medicare Other | Admitting: Pulmonary Disease

## 2016-11-24 ENCOUNTER — Ambulatory Visit (HOSPITAL_COMMUNITY)
Admission: RE | Admit: 2016-11-24 | Discharge: 2016-11-24 | Disposition: A | Discharge status: Home / Self Care | Payer: Medicare Other | Source: Ambulatory Visit | Attending: Pulmonary Disease | Admitting: Pulmonary Disease

## 2016-11-24 DIAGNOSIS — J432 Centrilobular emphysema: Secondary | ICD-10-CM | POA: Diagnosis not present

## 2016-11-24 DIAGNOSIS — R0602 Shortness of breath: Secondary | ICD-10-CM

## 2016-11-24 DIAGNOSIS — J479 Bronchiectasis, uncomplicated: Secondary | ICD-10-CM | POA: Insufficient documentation

## 2016-11-24 DIAGNOSIS — I700 Atherosclerosis of aorta: Secondary | ICD-10-CM | POA: Insufficient documentation

## 2016-11-24 DIAGNOSIS — I2510 Atherosclerotic heart disease of native coronary artery without angina pectoris: Secondary | ICD-10-CM | POA: Diagnosis not present

## 2016-11-24 DIAGNOSIS — R911 Solitary pulmonary nodule: Secondary | ICD-10-CM | POA: Diagnosis not present

## 2016-11-26 ENCOUNTER — Telehealth: Payer: Self-pay | Admitting: Pulmonary Disease

## 2016-11-26 DIAGNOSIS — R911 Solitary pulmonary nodule: Secondary | ICD-10-CM

## 2016-11-26 NOTE — Telephone Encounter (Signed)
Patient called about CT results. Dr. Lenna Gilford had actually just called her to give her the results. I made her aware that we were ordering a PET scan and to be expecting a phone call from our [facility] today.

## 2016-11-27 ENCOUNTER — Ambulatory Visit (HOSPITAL_COMMUNITY)
Admission: RE | Admit: 2016-11-27 | Discharge: 2016-11-27 | Disposition: A | Discharge status: Home / Self Care | Payer: Medicare Other | Source: Ambulatory Visit | Attending: Pulmonary Disease | Admitting: Pulmonary Disease

## 2016-11-27 ENCOUNTER — Other Ambulatory Visit: Payer: Self-pay | Admitting: *Deleted

## 2016-11-27 DIAGNOSIS — R911 Solitary pulmonary nodule: Secondary | ICD-10-CM

## 2016-11-27 DIAGNOSIS — R918 Other nonspecific abnormal finding of lung field: Secondary | ICD-10-CM | POA: Diagnosis not present

## 2016-11-27 DIAGNOSIS — I700 Atherosclerosis of aorta: Secondary | ICD-10-CM | POA: Diagnosis not present

## 2016-11-27 DIAGNOSIS — J439 Emphysema, unspecified: Secondary | ICD-10-CM | POA: Diagnosis not present

## 2016-11-27 LAB — GLUCOSE, CAPILLARY: GLUCOSE-CAPILLARY: 96 mg/dL (ref 65–99)

## 2016-11-27 MED ORDER — FLUDEOXYGLUCOSE F - 18 (FDG) INJECTION
10.0000 | Freq: Once | INTRAVENOUS | Status: AC | PRN
  Administered 2016-11-27: 10 via INTRAVENOUS

## 2016-11-27 NOTE — Patient Instructions (Signed)
Ellen Bowers  11/27/2016     @PREFPERIOPPHARMACY @   Your procedure is scheduled on  12/08/2016   Report to [facility] at  615   A.M.  Call this number if you have problems the morning of surgery:  [phone]   Remember:  Do not eat food or drink liquids after midnight.  Take these medicines the morning of surgery with A SIP OF WATER  Wellbutrin, celebrex, zyrtec, singulair, protonix. Use your nebulizer and your inhalers before you come. If you have a rescue inhaler, bring it with you.   Do not wear jewelry, make-up or nail polish.  Do not wear lotions, powders, or perfumes, or deoderant.  Do not shave 48 hours prior to surgery.  Men may shave face and neck.  Do not bring valuables to the hospital.  [facility] is not responsible for any belongings or valuables.  Contacts, dentures or bridgework may not be worn into surgery.  Leave your suitcase in the car.  After surgery it may be brought to your room.  For patients admitted to the hospital, discharge time will be determined by your treatment team.  Patients discharged the day of surgery will not be allowed to drive home.   Name and phone number of your driver:   family Special instructions:  Follow the diet and prep instructions given to you by Dr Nona Dell office.  Please read over the following fact sheets that you were given. Anesthesia Post-op Instructions and Care and Recovery After Surgery       Esophagogastroduodenoscopy Esophagogastroduodenoscopy (EGD) is a procedure to examine the lining of the esophagus, stomach, and first part of the small intestine (duodenum). This procedure is done to check for problems such as inflammation, bleeding, ulcers, or growths. During this procedure, a long, flexible, lighted tube with a camera attached (endoscope) is inserted down the throat. Tell a health care provider about:  Any allergies you have.  All medicines you are taking, including vitamins, herbs,  eye drops, creams, and over-the-counter medicines.  Any problems you or family members have had with anesthetic medicines.  Any blood disorders you have.  Any surgeries you have had.  Any medical conditions you have.  Whether you are pregnant or may be pregnant. What are the risks? Generally, this is a safe procedure. However, problems may occur, including:  Infection.  Bleeding.  A tear (perforation) in the esophagus, stomach, or duodenum.  Trouble breathing.  Excessive sweating.  Spasms of the larynx.  A slowed heartbeat.  Low blood pressure.  What happens before the procedure?  Follow instructions from your health care provider about eating or drinking restrictions.  Ask your health care provider about: ? Changing or stopping your regular medicines. This is especially important if you are taking diabetes medicines or blood thinners. ? Taking medicines such as aspirin and ibuprofen. These medicines can thin your blood. Do not take these medicines before your procedure if your health care provider instructs you not to.  Plan to have someone take you home after the procedure.  If you wear dentures, be ready to remove them before the procedure. What happens during the procedure?  To reduce your risk of infection, your health care team will wash or sanitize their hands.  An IV tube will be put in a vein in your hand or arm. You will get medicines and fluids through this tube.  You will be given one or more of the following: ?  A medicine to help you relax (sedative). ? A medicine to numb the area (local anesthetic). This medicine may be sprayed into your throat. It will make you feel more comfortable and keep you from gagging or coughing during the procedure. ? A medicine for pain.  A mouth guard may be placed in your mouth to protect your teeth and to keep you from biting on the endoscope.  You will be asked to lie on your left side.  The endoscope will be  lowered down your throat into your esophagus, stomach, and duodenum.  Air will be put into the endoscope. This will help your health care provider see better.  The lining of your esophagus, stomach, and duodenum will be examined.  Your health care provider may: ? Take a tissue sample so it can be looked at in a lab (biopsy). ? Remove growths. ? Remove objects (foreign bodies) that are stuck. ? Treat any bleeding with medicines or other devices that stop tissue from bleeding. ? Widen (dilate) or stretch narrowed areas of your esophagus and stomach.  The endoscope will be taken out. The procedure may vary among health care providers and hospitals. What happens after the procedure?  Your blood pressure, heart rate, breathing rate, and blood oxygen level will be monitored often until the medicines you were given have worn off.  Do not eat or drink anything until the numbing medicine has worn off and your gag reflex has returned. This information is not intended to replace advice given to you by your health care provider. Make sure you discuss any questions you have with your health care provider. Document Released: 05/01/2004 Document Revised: 06/06/2015 Document Reviewed: 11/22/2014 Elsevier Interactive Patient Education  2018 [employer]. Esophagogastroduodenoscopy, Care After Refer to this sheet in the next few weeks. These instructions provide you with information about caring for yourself after your procedure. Your health care provider may also give you more specific instructions. Your treatment has been planned according to current medical practices, but problems sometimes occur. Call your health care provider if you have any problems or questions after your procedure. What can I expect after the procedure? After the procedure, it is common to have:  A sore throat.  Nausea.  Bloating.  Dizziness.  Fatigue.  Follow these instructions at home:  Do not eat or drink anything  until the numbing medicine (local anesthetic) has worn off and your gag reflex has returned. You will know that the local anesthetic has worn off when you can swallow comfortably.  Do not drive for 24 hours if you received a medicine to help you relax (sedative).  If your health care provider took a tissue sample for testing during the procedure, make sure to get your test results. This is your responsibility. Ask your health care provider or the department performing the test when your results will be ready.  Keep all follow-up visits as told by your health care provider. This is important. Contact a health care provider if:  You cannot stop coughing.  You are not urinating.  You are urinating less than usual. Get help right away if:  You have trouble swallowing.  You cannot eat or drink.  You have throat or chest pain that gets worse.  You are dizzy or light-headed.  You faint.  You have nausea or vomiting.  You have chills.  You have a fever.  You have severe abdominal pain.  You have black, tarry, or bloody stools. This information is not intended to  replace advice given to you by your health care provider. Make sure you discuss any questions you have with your health care provider. Document Released: 12/16/2011 Document Revised: 06/06/2015 Document Reviewed: 11/22/2014 Elsevier Interactive Patient Education  2018 [employer] Anesthesia is a term that refers to techniques, procedures, and medicines that help a person stay safe and comfortable during a medical procedure. Monitored anesthesia care, or sedation, is one type of anesthesia. Your anesthesia specialist may recommend sedation if you will be having a procedure that does not require you to be unconscious, such as:  Cataract surgery.  A dental procedure.  A biopsy.  A colonoscopy.  During the procedure, you may receive a medicine to help you relax (sedative). There are three  levels of sedation:  Mild sedation. At this level, you may feel awake and relaxed. You will be able to follow directions.  Moderate sedation. At this level, you will be sleepy. You may not remember the procedure.  Deep sedation. At this level, you will be asleep. You will not remember the procedure.  The more medicine you are given, the deeper your level of sedation will be. Depending on how you respond to the procedure, the anesthesia specialist may change your level of sedation or the type of anesthesia to fit your needs. An anesthesia specialist will monitor you closely during the procedure. Let your health care provider know about:  Any allergies you have.  All medicines you are taking, including vitamins, herbs, eye drops, creams, and over-the-counter medicines.  Any use of steroids (by mouth or as a cream).  Any problems you or family members have had with sedatives and anesthetic medicines.  Any blood disorders you have.  Any surgeries you have had.  Any medical conditions you have, such as sleep apnea.  Whether you are pregnant or may be pregnant.  Any use of cigarettes, alcohol, or street drugs. What are the risks? Generally, this is a safe procedure. However, problems may occur, including:  Getting too much medicine (oversedation).  Nausea.  Allergic reaction to medicines.  Trouble breathing. If this happens, a breathing tube may be used to help with breathing. It will be removed when you are awake and breathing on your own.  Heart trouble.  Lung trouble.  Before the procedure Staying hydrated Follow instructions from your health care provider about hydration, which may include:  Up to 2 hours before the procedure - you may continue to drink clear liquids, such as water, clear fruit juice, black coffee, and plain tea.  Eating and drinking restrictions Follow instructions from your health care provider about eating and drinking, which may include:  8 hours  before the procedure - stop eating heavy meals or foods such as meat, fried foods, or fatty foods.  6 hours before the procedure - stop eating light meals or foods, such as toast or cereal.  6 hours before the procedure - stop drinking milk or drinks that contain milk.  2 hours before the procedure - stop drinking clear liquids.  Medicines Ask your health care provider about:  Changing or stopping your regular medicines. This is especially important if you are taking diabetes medicines or blood thinners.  Taking medicines such as aspirin and ibuprofen. These medicines can thin your blood. Do not take these medicines before your procedure if your health care provider instructs you not to.  Tests and exams  You will have a physical exam.  You may have blood tests done to show: ?  How well your kidneys and liver are working. ? How well your blood can clot.  General instructions  Plan to have someone take you home from the hospital or clinic.  If you will be going home right after the procedure, plan to have someone with you for 24 hours.  What happens during the procedure?  Your blood pressure, heart rate, breathing, level of pain and overall condition will be monitored.  An IV tube will be inserted into one of your veins.  Your anesthesia specialist will give you medicines as needed to keep you comfortable during the procedure. This may mean changing the level of sedation.  The procedure will be performed. After the procedure  Your blood pressure, heart rate, breathing rate, and blood oxygen level will be monitored until the medicines you were given have worn off.  Do not drive for 24 hours if you received a sedative.  You may: ? Feel sleepy, clumsy, or nauseous. ? Feel forgetful about what happened after the procedure. ? Have a sore throat if you had a breathing tube during the procedure. ? Vomit. This information is not intended to replace advice given to you by your  health care provider. Make sure you discuss any questions you have with your health care provider. Document Released: 09/24/2004 Document Revised: 06/07/2015 Document Reviewed: 04/21/2015 Elsevier Interactive Patient Education  [employer].

## 2016-12-02 ENCOUNTER — Encounter (HOSPITAL_COMMUNITY): Payer: Self-pay

## 2016-12-02 ENCOUNTER — Other Ambulatory Visit: Payer: Self-pay

## 2016-12-02 ENCOUNTER — Encounter (HOSPITAL_COMMUNITY)
Admission: RE | Admit: 2016-12-02 | Discharge: 2016-12-02 | Disposition: A | Discharge status: Home / Self Care | Payer: Medicare Other | Source: Ambulatory Visit | Attending: Gastroenterology | Admitting: Gastroenterology

## 2016-12-02 DIAGNOSIS — M1990 Unspecified osteoarthritis, unspecified site: Secondary | ICD-10-CM | POA: Insufficient documentation

## 2016-12-02 DIAGNOSIS — Z01812 Encounter for preprocedural laboratory examination: Secondary | ICD-10-CM | POA: Diagnosis not present

## 2016-12-02 DIAGNOSIS — F329 Major depressive disorder, single episode, unspecified: Secondary | ICD-10-CM | POA: Insufficient documentation

## 2016-12-02 DIAGNOSIS — Z87891 Personal history of nicotine dependence: Secondary | ICD-10-CM | POA: Insufficient documentation

## 2016-12-02 DIAGNOSIS — K219 Gastro-esophageal reflux disease without esophagitis: Secondary | ICD-10-CM | POA: Diagnosis not present

## 2016-12-02 DIAGNOSIS — J449 Chronic obstructive pulmonary disease, unspecified: Secondary | ICD-10-CM | POA: Diagnosis not present

## 2016-12-02 HISTORY — DX: Dyspnea, unspecified: R06.00

## 2016-12-02 HISTORY — DX: Disease of blood and blood-forming organs, unspecified: D75.9

## 2016-12-02 LAB — BASIC METABOLIC PANEL
Anion gap: 10 (ref 5–15)
BUN: 14 mg/dL (ref 6–20)
CO2: 23 mmol/L (ref 22–32)
Calcium: 9.3 mg/dL (ref 8.9–10.3)
Chloride: 103 mmol/L (ref 101–111)
Creatinine, Ser: 0.86 mg/dL (ref 0.44–1.00)
GFR calc Af Amer: >60 mL/min (ref >60)
GLUCOSE: 92 mg/dL (ref 65–99)
POTASSIUM: 3.8 mmol/L (ref 3.5–5.1)
Sodium: 136 mmol/L (ref 135–145)

## 2016-12-02 LAB — CBC
HEMATOCRIT: 39.6 % (ref 36.0–46.0)
HEMOGLOBIN: 12.6 g/dL (ref 12.0–15.0)
MCH: 28.3 pg (ref 26.0–34.0)
MCHC: 31.8 g/dL (ref 30.0–36.0)
MCV: 89 fL (ref 78.0–100.0)
Platelets: 232 10*3/uL (ref 150–400)
RBC: 4.45 MIL/uL (ref 3.87–5.11)
RDW: 14.8 % (ref 11.5–15.5)
WBC: 9 10*3/uL (ref 4.0–10.5)

## 2016-12-03 ENCOUNTER — Telehealth: Payer: Self-pay | Admitting: Gastroenterology

## 2016-12-03 NOTE — Telephone Encounter (Signed)
PLEASE CALL PT. HER CBC AND BMP ARE NORMAL.

## 2016-12-06 ENCOUNTER — Other Ambulatory Visit: Payer: Self-pay | Admitting: Radiology

## 2016-12-08 ENCOUNTER — Other Ambulatory Visit: Payer: Self-pay | Admitting: Physician Assistant

## 2016-12-08 ENCOUNTER — Ambulatory Visit (HOSPITAL_COMMUNITY): Payer: Medicare Other | Admitting: Anesthesiology

## 2016-12-08 ENCOUNTER — Ambulatory Visit (HOSPITAL_COMMUNITY)
Admission: RE | Admit: 2016-12-08 | Discharge: 2016-12-08 | Disposition: A | Discharge status: Home / Self Care | Payer: Medicare Other | Source: Ambulatory Visit | Attending: Gastroenterology | Admitting: Gastroenterology

## 2016-12-08 ENCOUNTER — Other Ambulatory Visit: Payer: Self-pay | Admitting: Student

## 2016-12-08 ENCOUNTER — Encounter (HOSPITAL_COMMUNITY): Payer: Self-pay | Admitting: *Deleted

## 2016-12-08 ENCOUNTER — Encounter (HOSPITAL_COMMUNITY)
Admission: RE | Disposition: A | Discharge status: Home / Self Care | Payer: Self-pay | Source: Ambulatory Visit | Attending: Gastroenterology

## 2016-12-08 DIAGNOSIS — K219 Gastro-esophageal reflux disease without esophagitis: Secondary | ICD-10-CM | POA: Insufficient documentation

## 2016-12-08 DIAGNOSIS — J449 Chronic obstructive pulmonary disease, unspecified: Secondary | ICD-10-CM | POA: Insufficient documentation

## 2016-12-08 DIAGNOSIS — Z7982 Long term (current) use of aspirin: Secondary | ICD-10-CM | POA: Insufficient documentation

## 2016-12-08 DIAGNOSIS — Z791 Long term (current) use of non-steroidal anti-inflammatories (NSAID): Secondary | ICD-10-CM | POA: Insufficient documentation

## 2016-12-08 DIAGNOSIS — E785 Hyperlipidemia, unspecified: Secondary | ICD-10-CM | POA: Insufficient documentation

## 2016-12-08 DIAGNOSIS — K2970 Gastritis, unspecified, without bleeding: Secondary | ICD-10-CM

## 2016-12-08 DIAGNOSIS — Z87891 Personal history of nicotine dependence: Secondary | ICD-10-CM | POA: Diagnosis not present

## 2016-12-08 DIAGNOSIS — K2950 Unspecified chronic gastritis without bleeding: Secondary | ICD-10-CM | POA: Diagnosis not present

## 2016-12-08 DIAGNOSIS — R1013 Epigastric pain: Secondary | ICD-10-CM

## 2016-12-08 DIAGNOSIS — F329 Major depressive disorder, single episode, unspecified: Secondary | ICD-10-CM | POA: Insufficient documentation

## 2016-12-08 DIAGNOSIS — Z7951 Long term (current) use of inhaled steroids: Secondary | ICD-10-CM | POA: Insufficient documentation

## 2016-12-08 DIAGNOSIS — Z79899 Other long term (current) drug therapy: Secondary | ICD-10-CM | POA: Insufficient documentation

## 2016-12-08 DIAGNOSIS — B9681 Helicobacter pylori [H. pylori] as the cause of diseases classified elsewhere: Secondary | ICD-10-CM | POA: Diagnosis not present

## 2016-12-08 HISTORY — PX: ESOPHAGOGASTRODUODENOSCOPY (EGD) WITH PROPOFOL: SHX5813

## 2016-12-08 HISTORY — PX: BRAVO PH STUDY: SHX5421

## 2016-12-08 HISTORY — PX: BIOPSY: SHX5522

## 2016-12-08 SURGERY — ESOPHAGOGASTRODUODENOSCOPY (EGD) WITH PROPOFOL
Anesthesia: Monitor Anesthesia Care

## 2016-12-08 MED ORDER — LIDOCAINE VISCOUS 2 % MT SOLN
OROMUCOSAL | Status: AC
  Filled 2016-12-08: qty 15

## 2016-12-08 MED ORDER — ONDANSETRON HCL 4 MG/2ML IJ SOLN
4.0000 mg | Freq: Once | INTRAMUSCULAR | Status: AC
Start: 2016-12-08 — End: 2016-12-08
  Administered 2016-12-08: 4 mg via INTRAVENOUS

## 2016-12-08 MED ORDER — ONDANSETRON HCL 4 MG/2ML IJ SOLN
INTRAMUSCULAR | Status: AC
  Filled 2016-12-08: qty 2

## 2016-12-08 MED ORDER — GLYCOPYRROLATE 0.2 MG/ML IJ SOLN
0.2000 mg | Freq: Once | INTRAMUSCULAR | Status: AC | PRN
  Administered 2016-12-08: 0.2 mg via INTRAVENOUS

## 2016-12-08 MED ORDER — CHLORHEXIDINE GLUCONATE CLOTH 2 % EX PADS
6.0000 | MEDICATED_PAD | Freq: Once | CUTANEOUS | Status: DC
Start: 2016-12-08 — End: 2016-12-08

## 2016-12-08 MED ORDER — LIDOCAINE VISCOUS 2 % MT SOLN
5.0000 mL | Freq: Two times a day (BID) | OROMUCOSAL | Status: AC
  Administered 2016-12-08 (×2): 5 mL via OROMUCOSAL

## 2016-12-08 MED ORDER — MIDAZOLAM HCL 5 MG/5ML IJ SOLN
INTRAMUSCULAR | Status: DC | PRN
  Administered 2016-12-08: 2 mg via INTRAVENOUS

## 2016-12-08 MED ORDER — DEXAMETHASONE SODIUM PHOSPHATE 4 MG/ML IJ SOLN
INTRAMUSCULAR | Status: AC
  Filled 2016-12-08: qty 1

## 2016-12-08 MED ORDER — FENTANYL CITRATE (PF) 100 MCG/2ML IJ SOLN
INTRAMUSCULAR | Status: AC
  Filled 2016-12-08: qty 2

## 2016-12-08 MED ORDER — DEXAMETHASONE SODIUM PHOSPHATE 4 MG/ML IJ SOLN
4.0000 mg | INTRAMUSCULAR | Status: AC
  Administered 2016-12-08: 4 mg via INTRAVENOUS

## 2016-12-08 MED ORDER — PROPOFOL 500 MG/50ML IV EMUL
INTRAVENOUS | Status: DC | PRN
  Administered 2016-12-08: 100 ug/kg/min via INTRAVENOUS

## 2016-12-08 MED ORDER — ONDANSETRON HCL 4 MG/2ML IJ SOLN
4.0000 mg | Freq: Once | INTRAMUSCULAR | Status: AC
  Administered 2016-12-08: 4 mg via INTRAVENOUS

## 2016-12-08 MED ORDER — MIDAZOLAM HCL 2 MG/2ML IJ SOLN
INTRAMUSCULAR | Status: AC
  Filled 2016-12-08: qty 2

## 2016-12-08 MED ORDER — GLYCOPYRROLATE 0.2 MG/ML IJ SOLN
INTRAMUSCULAR | Status: AC
  Filled 2016-12-08: qty 1

## 2016-12-08 MED ORDER — MIDAZOLAM HCL 2 MG/2ML IJ SOLN
1.0000 mg | INTRAMUSCULAR | Status: AC
  Administered 2016-12-08: 2 mg via INTRAVENOUS

## 2016-12-08 MED ORDER — FENTANYL CITRATE (PF) 100 MCG/2ML IJ SOLN
25.0000 ug | Freq: Once | INTRAMUSCULAR | Status: AC
  Administered 2016-12-08: 25 ug via INTRAVENOUS

## 2016-12-08 MED ORDER — LACTATED RINGERS IV SOLN
INTRAVENOUS | Status: DC
  Administered 2016-12-08: 1000 mL via INTRAVENOUS

## 2016-12-08 MED ORDER — PROPOFOL 10 MG/ML IV BOLUS
INTRAVENOUS | Status: AC
  Filled 2016-12-08: qty 40

## 2016-12-08 NOTE — Transfer of Care (Signed)
Immediate Anesthesia Transfer of Care Note  Patient: Ellen Bowers  Procedure(s) Performed: ESOPHAGOGASTRODUODENOSCOPY (EGD) WITH PROPOFOL (N/A ) BRAVO PH STUDY; Bravo Capsule Placement (N/A )  Patient Location: PACU  Anesthesia Type:MAC  Level of Consciousness: awake, oriented and patient cooperative  Airway & Oxygen Therapy: Patient Spontanous Breathing and Patient connected to face mask oxygen  Post-op Assessment: Report given to RN, Post -op Vital signs reviewed and stable and Patient moving all extremities  Post vital signs: Reviewed and stable  Last Vitals:  Vitals:   12/08/16 0725 12/08/16 0730  BP: 129/75 134/75  Pulse:    Resp: (!) 24 (!) 24  Temp:    SpO2: 96% 97%    Last Pain:  Vitals:   12/08/16 0655  TempSrc: Oral      Patients Stated Pain Goal: 6 (98/92/11 9417)  Complications: No apparent anesthesia complications

## 2016-12-08 NOTE — Op Note (Addendum)
[facility] Patient Name: Ellen Bowers Procedure Date: 12/08/2016 7:18 AM MRN: [id number] Date of Birth: [birth year] Attending MD: Barney Drain MD, MD CSN: [id number] Age: 71 Admit Type: Outpatient Procedure:                Upper GI endoscopy with COLD FORCEPS BIOPSY/BRAVO                            CAPSULE PALCEMENT Indications:              Dyspepsia, Heartburn Providers:                Barney Drain MD, MD, Hinton Rao, RN, Randa Spike, Technician Referring MD:             Fuller Canada Manuella Ghazi MD, MD Medicines:                Propofol per Anesthesia Complications:            No immediate complications. Estimated Blood Loss:     Estimated blood loss was minimal. Procedure:                Pre-Anesthesia Assessment:                           - Prior to the procedure, a History and Physical                            was performed, and patient medications and                            allergies were reviewed. The patient's tolerance of                            previous anesthesia was also reviewed. The risks                            and benefits of the procedure and the sedation                            options and risks were discussed with the patient.                            All questions were answered, and informed consent                            was obtained. Prior Anticoagulants: The patient has                            taken aspirin, last dose was 7 days prior to                            procedure. ASA Grade Assessment: II - A patient  with mild systemic disease. After reviewing the                            risks and benefits, the patient was deemed in                            satisfactory condition to undergo the procedure.                            After obtaining informed consent, the endoscope was                            passed under direct vision. Throughout the   procedure, the patient's blood pressure, pulse, and                            oxygen saturations were monitored continuously. The                            EG-[id number] ([id number]) scope was introduced through the                            mouth, and advanced to the second part of duodenum.                            The upper GI endoscopy was somewhat difficult due                            to RESISTANCE IN PASSING BRAVO PROBE. SLIGHT HEME                            WAS SEEN AFTER PASSING INTRODUCER. The patient                            tolerated the procedure fairly well. Scope In: 7:45:23 AM Scope Out: 7:58:06 AM Total Procedure Duration: 0 hours 12 minutes 43 seconds  Findings:      The examined esophagus was normal. EGJ 40 CM FROM THE INCISORS. BRAVO       CAPSULE PLACED 34 CM FROM THE INCISORS.      Diffuse moderate inflammation characterized by congestion (edema) and       erythema was found in the entire examined stomach. Biopsies were taken       with a cold forceps for Helicobacter pylori testing(4: ANTRUM, 2: BODY).      The examined duodenum was normal. Impression:               - DYSPEPSIA LIKELY DUE TO REFLUX AND GASTRITIS Moderate Sedation:      Per Anesthesia Care Recommendation:           - Await pathology results.                           - Resume previous diet. RETURN BRAVO IN 48 HRS.  CONTINUE PROTONIX BID AND MAALOX/ZANTAC IF NEEDED.                           - Continue present medications.                           - Return to my office in 4 months.                           - Patient has a contact number available for                            emergencies. The signs and symptoms of potential                            delayed complications were discussed with the                            patient. Return to normal activities tomorrow.                            Written discharge instructions were provided to the                             patient. Procedure Code(s):        --- Professional ---                           [phone], Esophagogastroduodenoscopy, flexible,                            transoral; with biopsy, single or multiple Diagnosis Code(s):        --- Professional ---                           K29.70, Gastritis, unspecified, without bleeding                           R10.13, Epigastric pain                           R12, Heartburn CPT copyright 2016 American Medical Association. All rights reserved. The codes documented in this report are preliminary and upon coder review may  be revised to meet current compliance requirements. Barney Drain, MD Barney Drain MD, MD 12/08/2016 8:18:45 AM This report has been signed electronically. Number of Addenda: 0

## 2016-12-08 NOTE — Progress Notes (Signed)
Dr Oneida Alar notified at 7:29 am that patient is scheduled for a CT scan with biopsy of lung tomorrow.  Bravo instructions say no X Rays for 48 hours, no orders given

## 2016-12-08 NOTE — Anesthesia Preprocedure Evaluation (Signed)
Anesthesia Evaluation  Patient identified by MRN, date of birth, ID band Patient awake    Reviewed: Allergy & Precautions, NPO status , Patient's Chart, lab work & pertinent test results  Airway Mallampati: III  TM Distance: >3 FB Neck ROM: Full    Dental  (+) Teeth Intact   Pulmonary shortness of breath and with exertion, asthma , COPD (chronic cough, sinus drainage.), former smoker,    breath sounds clear to auscultation       Cardiovascular negative cardio ROS   Rhythm:Regular Rate:Normal     Neuro/Psych PSYCHIATRIC DISORDERS Depression    GI/Hepatic GERD  ,  Endo/Other    Renal/GU      Musculoskeletal  (+) Arthritis ,   Abdominal   Peds  Hematology   Anesthesia Other Findings   Reproductive/Obstetrics                             Anesthesia Physical Anesthesia Plan  ASA: III  Anesthesia Plan: MAC   Post-op Pain Management:    Induction: Intravenous  PONV Risk Score and Plan:   Airway Management Planned: Simple Face Mask  Additional Equipment:   Intra-op Plan:   Post-operative Plan:   Informed Consent: I have reviewed the patients History and Physical, chart, labs and discussed the procedure including the risks, benefits and alternatives for the proposed anesthesia with the patient or authorized representative who has indicated his/her understanding and acceptance.     Plan Discussed with:   Anesthesia Plan Comments:         Anesthesia Quick Evaluation

## 2016-12-08 NOTE — Anesthesia Postprocedure Evaluation (Signed)
Anesthesia Post Note  Patient: Ellen Bowers  Procedure(s) Performed: ESOPHAGOGASTRODUODENOSCOPY (EGD) WITH PROPOFOL (N/A ) BRAVO PH STUDY; Bravo Capsule Placement (N/A )  Patient location during evaluation: PACU Anesthesia Type: MAC Level of consciousness: awake and alert and oriented Pain management: pain level controlled Vital Signs Assessment: post-procedure vital signs reviewed and stable Respiratory status: spontaneous breathing, nonlabored ventilation and respiratory function stable Cardiovascular status: blood pressure returned to baseline Postop Assessment: no apparent nausea or vomiting Anesthetic complications: no     Last Vitals:  Vitals:   12/08/16 0725 12/08/16 0730  BP: 129/75 134/75  Pulse:    Resp: (!) 24 (!) 24  Temp:    SpO2: 96% 97%    Last Pain:  Vitals:   12/08/16 0655  TempSrc: Oral                 Aubrina Nieman J

## 2016-12-08 NOTE — Discharge Instructions (Signed)
I PLACED A CAPSULE IN YOUR ESOPHAGUS.  IT WILL FALL OFF & PASS IN YOUR STOOL. You have gastritis LIKELY DUE TO ASPIRIN USE. I biopsied your stomach.     RECORD EVERYTHING THAT GOES INTO YOUR MOUTH FOR THE NEXT 48 HOURS.  TAKE YOUR PROTONIX TODAY. USE MAALOX OR ZANTAC IF NEEDED TO CONTROL HEARTBURN  RETURN THE RECORDER ON THURSDAY.   YOUR BIOPSY RESULTS WILL BE AVAILABLE IN MY CHART AFTER DEC 1 AND MY OFFICE WILL CONTACT YOU IN 10-14 DAYS WITH YOUR RESULTS.    FOLLOW UP IN [facility] 2019.    ENDOSCOPY Care After Read the instructions outlined below and refer to this sheet in the next week. These discharge instructions provide you with general information on caring for yourself after you leave the hospital. While your treatment has been planned according to the most current medical practices available, unavoidable complications occasionally occur. If you have any problems or questions after discharge, call DR. Kemberly Taves, [phone].  ACTIVITY  You may resume your regular activity, but move at a slower pace for the next 24 hours.   Take frequent rest periods for the next 24 hours.   Walking will help get rid of the air and reduce the bloated feeling in your belly (abdomen).   No driving for 24 hours (because of the medicine (anesthesia) used during the test).   You may shower.   Do not sign any important legal documents or operate any machinery for 24 hours (because of the anesthesia used during the test).    NUTRITION  Drink plenty of fluids.   You may resume your normal diet as instructed by your doctor.   Begin with a light meal and progress to your normal diet. Heavy or fried foods are harder to digest and may make you feel sick to your stomach (nauseated).   Avoid alcoholic beverages for 24 hours or as instructed.    MEDICATIONS  You may resume your normal medications.   WHAT YOU CAN EXPECT TODAY  Some feelings of bloating in the abdomen.   Passage of more gas  than usual.   Spotting of blood in your stool or on the toilet paper  .  IF YOU HAD POLYPS REMOVED DURING THE ENDOSCOPY:  Eat a soft diet IF YOU HAVE NAUSEA, BLOATING, ABDOMINAL PAIN, OR VOMITING.    FINDING OUT THE RESULTS OF YOUR TEST Not all test results are available during your visit. DR. Oneida Alar WILL CALL YOU WITHIN 14 DAYS OF YOUR PROCEDUE WITH YOUR RESULTS. Do not assume everything is normal if you have not heard from DR. Aikam Hellickson, CALL HER OFFICE AT [phone].  SEEK IMMEDIATE MEDICAL ATTENTION AND CALL THE OFFICE: [phone] IF:  You have more than a spotting of blood in your stool.   Your belly is swollen (abdominal distention).   You are nauseated or vomiting.   You have a temperature over 101F.   You have abdominal pain or discomfort that is severe or gets worse throughout the day.

## 2016-12-08 NOTE — Telephone Encounter (Signed)
Tried to  Call. Line busy. Mailing a letter of normal results.

## 2016-12-08 NOTE — Interval H&P Note (Signed)
History and Physical Interval Note:  12/08/2016 7:23 AM  Ellen Bowers  has presented today for surgery, with the diagnosis of dyspepsia, uncontrolled reflux on meds  The various methods of treatment have been discussed with the patient and family. After consideration of risks, benefits and other options for treatment, the patient has consented to  Procedure(s) with comments: ESOPHAGOGASTRODUODENOSCOPY (EGD) WITH PROPOFOL (N/A) - 7:30am BRAVO PH STUDY; Bravo Capsule Placement (N/A) as a surgical intervention .  The patient's history has been reviewed, patient examined, no change in status, stable for surgery.  I have reviewed the patient's chart and labs.  Questions were answered to the patient's satisfaction.     [employer]

## 2016-12-09 ENCOUNTER — Ambulatory Visit (HOSPITAL_COMMUNITY)
Admission: RE | Admit: 2016-12-09 | Discharge: 2016-12-09 | Disposition: A | Discharge status: Home / Self Care | Payer: Medicare Other | Source: Ambulatory Visit | Attending: Interventional Radiology | Admitting: Interventional Radiology

## 2016-12-09 ENCOUNTER — Encounter (HOSPITAL_COMMUNITY): Payer: Self-pay

## 2016-12-09 ENCOUNTER — Ambulatory Visit (HOSPITAL_COMMUNITY)
Admission: RE | Admit: 2016-12-09 | Discharge: 2016-12-09 | Disposition: A | Discharge status: Home / Self Care | Payer: Medicare Other | Source: Ambulatory Visit | Attending: Pulmonary Disease | Admitting: Pulmonary Disease

## 2016-12-09 DIAGNOSIS — Z9889 Other specified postprocedural states: Secondary | ICD-10-CM | POA: Diagnosis not present

## 2016-12-09 DIAGNOSIS — E785 Hyperlipidemia, unspecified: Secondary | ICD-10-CM | POA: Insufficient documentation

## 2016-12-09 DIAGNOSIS — Z79899 Other long term (current) drug therapy: Secondary | ICD-10-CM | POA: Diagnosis not present

## 2016-12-09 DIAGNOSIS — R911 Solitary pulmonary nodule: Secondary | ICD-10-CM | POA: Diagnosis present

## 2016-12-09 DIAGNOSIS — Z7982 Long term (current) use of aspirin: Secondary | ICD-10-CM | POA: Insufficient documentation

## 2016-12-09 DIAGNOSIS — Z79891 Long term (current) use of opiate analgesic: Secondary | ICD-10-CM | POA: Insufficient documentation

## 2016-12-09 DIAGNOSIS — K219 Gastro-esophageal reflux disease without esophagitis: Secondary | ICD-10-CM | POA: Insufficient documentation

## 2016-12-09 DIAGNOSIS — D680 Von Willebrand's disease: Secondary | ICD-10-CM | POA: Diagnosis not present

## 2016-12-09 DIAGNOSIS — Z800 Family history of malignant neoplasm of digestive organs: Secondary | ICD-10-CM | POA: Insufficient documentation

## 2016-12-09 DIAGNOSIS — C3431 Malignant neoplasm of lower lobe, right bronchus or lung: Secondary | ICD-10-CM | POA: Diagnosis not present

## 2016-12-09 DIAGNOSIS — I2510 Atherosclerotic heart disease of native coronary artery without angina pectoris: Secondary | ICD-10-CM | POA: Insufficient documentation

## 2016-12-09 DIAGNOSIS — Z87891 Personal history of nicotine dependence: Secondary | ICD-10-CM | POA: Insufficient documentation

## 2016-12-09 DIAGNOSIS — F329 Major depressive disorder, single episode, unspecified: Secondary | ICD-10-CM | POA: Diagnosis not present

## 2016-12-09 DIAGNOSIS — J449 Chronic obstructive pulmonary disease, unspecified: Secondary | ICD-10-CM | POA: Insufficient documentation

## 2016-12-09 DIAGNOSIS — Z888 Allergy status to other drugs, medicaments and biological substances status: Secondary | ICD-10-CM | POA: Diagnosis not present

## 2016-12-09 DIAGNOSIS — I700 Atherosclerosis of aorta: Secondary | ICD-10-CM | POA: Insufficient documentation

## 2016-12-09 LAB — CBC
HEMATOCRIT: 38.3 % (ref 36.0–46.0)
Hemoglobin: 12.5 g/dL (ref 12.0–15.0)
MCH: 28.3 pg (ref 26.0–34.0)
MCHC: 32.6 g/dL (ref 30.0–36.0)
MCV: 86.7 fL (ref 78.0–100.0)
PLATELETS: 212 10*3/uL (ref 150–400)
RBC: 4.42 MIL/uL (ref 3.87–5.11)
RDW: 15 % (ref 11.5–15.5)
WBC: 8.4 10*3/uL (ref 4.0–10.5)

## 2016-12-09 LAB — APTT: aPTT: 31 seconds (ref 24–36)

## 2016-12-09 LAB — PROTIME-INR
INR: 0.92
Prothrombin Time: 12.3 seconds (ref 11.4–15.2)

## 2016-12-09 MED ORDER — MIDAZOLAM HCL 2 MG/2ML IJ SOLN
INTRAMUSCULAR | Status: AC
  Filled 2016-12-09: qty 4

## 2016-12-09 MED ORDER — FENTANYL CITRATE (PF) 100 MCG/2ML IJ SOLN
INTRAMUSCULAR | Status: AC | PRN
  Administered 2016-12-09: 25 ug via INTRAVENOUS

## 2016-12-09 MED ORDER — FENTANYL CITRATE (PF) 100 MCG/2ML IJ SOLN
INTRAMUSCULAR | Status: AC
  Filled 2016-12-09: qty 4

## 2016-12-09 MED ORDER — LIDOCAINE-EPINEPHRINE 1 %-1:100000 IJ SOLN
INTRAMUSCULAR | Status: AC
  Filled 2016-12-09: qty 1

## 2016-12-09 MED ORDER — MIDAZOLAM HCL 2 MG/2ML IJ SOLN
INTRAMUSCULAR | Status: AC | PRN
  Administered 2016-12-09: 0.5 mg via INTRAVENOUS

## 2016-12-09 MED ORDER — SODIUM CHLORIDE 0.9 % IV SOLN: INTRAVENOUS | Status: DC

## 2016-12-09 NOTE — Sedation Documentation (Signed)
Patient is resting comfortably. 

## 2016-12-09 NOTE — H&P (Signed)
Chief Complaint: Patient was seen in consultation today for right lung nodule biopsy at the request of [place] M  Referring Physician(s): [place] M  Supervising Physician: Sandi Mariscal  Patient Status: [facility] - Out-pt  History of Present Illness: Ellen Bowers is a 71 y.o. female being worked up for right lower lung nodule. It was hypermetabolic on PET scan and she is referred for CT guided biopsy. PMHx, meds, labs, imaging reviewed. Has been NPO this am. Feels well, no c/o Husband at bedside  Past Medical History:  Diagnosis Date  . Arthritis   . Asthma   . Blood dyscrasia   . COPD (chronic obstructive pulmonary disease) ([place])   . Depression   . Dyspnea   . GERD (gastroesophageal reflux disease)   . Helicobacter pylori gastritis 2005  . Hyperlipidemia   . Von Willebrand disease ([place]) 1996    Past Surgical History:  Procedure Laterality Date  . BREAST LUMPECTOMY    . CATARACT EXTRACTION     double  . COLONOSCOPY N/A 05/30/2012   Dr.Mann:Multiple polyps removed from the right colon-see description above; otherwise normal colonoscopy up to the cecum. 5 polyps, multiple tubular adenomas. next tcs 05/2015  . COLONOSCOPY N/A 12/13/2015   Procedure: COLONOSCOPY;  Surgeon: Danie Binder, MD;  Location: [facility] ENDO SUITE;  Service: Endoscopy;  Laterality: N/A;  8:30 am  . ESOPHAGOGASTRODUODENOSCOPY  2012   Dr. Collene Mares: few erosions in duodenal bulb. BRAVO off PPI ("severe reflux")  . ESOPHAGOGASTRODUODENOSCOPY N/A 06/12/2014   SLF: 1. The mucosa of the esophagus appeared normal 2. Mild non-erosive gastrtitis.   [name] FLEXIBLE SIGMOIDOSCOPY N/A 02/05/2014   Dr. Oneida Alar: 5, 2-3 mm sessile sigmoid colon polyps removed, small internal hemorrhoids, moderate external hemorrhoids, 2 anal fissures present. Polyps were hyperplastic.   [name] NECK SURGERY  1951   cyst removal  . NECK SURGERY  1998   growth removed  . TONSILLECTOMY  1951  . TUBAL LIGATION  1984    Allergies: Demerol  [meperidine] and Betadine [povidone iodine]  Medications: Prior to Admission medications   Medication Sig Start Date End Date Taking? Authorizing Provider  acetaminophen (TYLENOL) 500 MG tablet Take 1,000 mg by mouth 2 (two) times daily as needed for moderate pain or headache.   Yes [provider]  alum & mag hydroxide-simeth (MAALOX/MYLANTA) 200-200-20 MG/5ML suspension Take 15 mLs by mouth as needed for indigestion or heartburn.   Yes [provider]  Ascorbic Acid (VITAMIN C) 1000 MG tablet Take 1,000 mg by mouth daily.   Yes [provider]  azelastine (ASTELIN) 0.1 % nasal spray Place 2 sprays into both nostrils daily as needed for rhinitis. Use in each nostril as directed   Yes [provider]  b complex vitamins tablet Take 1 tablet by mouth daily.   Yes [provider]  budesonide (PULMICORT) 0.5 MG/2ML nebulizer solution Takes as needed   Yes [provider]  buPROPion (WELLBUTRIN SR) 200 MG 12 hr tablet Take 200 mg by mouth 2 (two) times daily. 09/02/15  Yes [provider]  Ca Carbonate-Mag Hydroxide (ROLAIDS PO) Take 2 tablets by mouth as needed.   Yes [provider]  CALCIUM-MAGNESIUM PO Take 1 tablet by mouth every evening.   Yes [provider]  celecoxib (CELEBREX) 200 MG capsule Take 200 mg by mouth daily.    Yes [provider]  cetirizine (ZYRTEC) 10 MG tablet Take 10 mg by mouth daily.   Yes [provider]  docusate  sodium (COLACE) 100 MG capsule Take 100 mg by mouth 2 (two) times daily.    Yes [provider]  doxylamine, Sleep, (UNISOM) 25 MG tablet Take 12.5 mg by mouth at bedtime.   Yes [provider]  fluticasone (FLONASE) 50 MCG/ACT nasal spray Place 2 sprays into both nostrils daily as needed for allergies or rhinitis.   Yes [provider]  Fluticasone-Salmeterol (ADVAIR) 500-50 MCG/DOSE AEPB Inhale 1 puff into the lungs 2 (two) times daily.     Yes [provider]  HYDROcodone-homatropine (HYCODAN) 5-1.5 MG/5ML syrup Take 5 mLs by mouth every 6 (six) hours as needed for cough. 09/10/16  Yes Noralee Space, MD  ipratropium-albuterol (DUONEB) 0.5-2.5 (3) MG/3ML SOLN Take 3 mLs by nebulization 3 (three) times daily. 09/18/16  Yes Noralee Space, MD  montelukast (SINGULAIR) 10 MG tablet Take 1 tablet by mouth daily. 02/14/16  Yes [provider]  OVER THE COUNTER MEDICATION Take 1 tablet by mouth at bedtime. "midnight" dietary supplement   Yes [provider]  pantoprazole (PROTONIX) 40 MG tablet Take 1 tablet (40 mg total) by mouth 2 (two) times daily. 30 minutes before breakfast and dinner 09/10/16  Yes Noralee Space, MD  pravastatin (PRAVACHOL) 20 MG tablet Take 20 mg by mouth at bedtime.    Yes [provider]  ranitidine (ZANTAC) 150 MG tablet Take 150 mg at bedtime as needed by mouth for heartburn.   Yes [provider]  solifenacin (VESICARE) 5 MG tablet Take 5 mg by mouth every evening.    Yes [provider]  umeclidinium bromide (INCRUSE ELLIPTA) 62.5 MCG/INH AEPB Inhale 1 puff into the lungs daily. 09/10/16  Yes Noralee Space, MD  aspirin 81 MG tablet Take 81 mg by mouth daily.    [provider]  diclofenac sodium (VOLTAREN) 1 % GEL Apply 2 g topically daily as needed. 01/20/16   Garald Balding, MD     Family History  Problem Relation Age of Onset  . Colon cancer Mother        Less than age 54  . Colon cancer Father        More than age 64, died from metastatic disease at age 74  . Liver disease Neg Hx     Social History   Socioeconomic History  . Marital status: Married    Spouse name: None  . Number of children: None  . Years of education: None  . Highest education level: None  Social Needs  . Financial resource strain: None  . Food insecurity - worry: None  . Food insecurity - inability: None  . Transportation needs - medical: None  . Transportation  needs - non-medical: None  Occupational History  . None  Tobacco Use  . Smoking status: Former Smoker    Packs/day: 1.00    Years: 50.00    Pack years: 50.00    Types: Cigarettes    Start date: 03/17/1961    Last attempt to quit: 05/30/2009    Years since quitting: 7.5  . Smokeless tobacco: Never Used  . Tobacco comment: Quit smoking x 5 years  Substance and Sexual Activity  . Alcohol use: Yes    Comment: Occasionally    . Drug use: No  . Sexual activity: None  Other Topics Concern  . None  Social History Narrative  . None     Review of Systems: A 12 point ROS discussed and pertinent positives are indicated in the HPI  above.  All other systems are negative.  Review of Systems  Vital Signs: BP (!) 141/68   Pulse 75   Temp 98.1 F (36.7 C)   Resp 20   Ht 5\' 9"  (1.753 m)   Wt 201 lb (91.2 kg)   SpO2 98%   BMI 29.68 kg/m   Physical Exam  Constitutional: She is oriented to person, place, and time. She appears well-developed. No distress.  HENT:  Head: Normocephalic.  Mouth/Throat: Oropharynx is clear and moist.  Neck: Normal range of motion. No JVD present. No tracheal deviation present.  Cardiovascular: Normal rate, regular rhythm and normal heart sounds.  Pulmonary/Chest: Effort normal and breath sounds normal. No respiratory distress.  Neurological: She is alert and oriented to person, place, and time.  Skin: Skin is warm.  Psychiatric: She has a normal mood and affect. Judgment normal.    Imaging: Ct Chest Wo Contrast  Result Date: 11/25/2016 CLINICAL DATA:  71 year old female with history productive cough. EXAM: CT CHEST WITHOUT CONTRAST TECHNIQUE: Multidetector CT imaging of the chest was performed following the standard protocol without IV contrast. COMPARISON:  PET-CT 05/14/2016.  Chest CT 04/23/2016. FINDINGS: Cardiovascular: Heart size is normal. There is no significant pericardial fluid, thickening or pericardial calcification. There is aortic  atherosclerosis, as well as atherosclerosis of the great vessels of the mediastinum and the coronary arteries, including calcified atherosclerotic plaque in the left main, left anterior descending, left circumflex and right coronary arteries. Mediastinum/Nodes: No pathologically enlarged mediastinal or hilar lymph nodes. Please note that accurate exclusion of hilar adenopathy is limited on noncontrast CT scans. Esophagus is unremarkable in appearance. No axillary lymphadenopathy. Lungs/Pleura: Previously noted right lower lobe pulmonary nodule has increased in size in appears more solid than the prior examination, currently measuring 2.4 x 1.1 x 1.2 cm (axial image 97 of series 4, and coronal image 124 of series 5), previously only 1.8 x 1.0 x 0.7 cm on 04/23/2016. This nodule has an aggressive appearance with macrolobulated slightly spiculated margins and appears to contact the overlying pleura posteriorly. No other definite suspicious appearing pulmonary nodules or masses are noted. No acute consolidative airspace disease. No pleural effusions. A few scattered areas of mild cylindrical bronchiectasis, peripheral bronchiolectasis, thickening of the peribronchovascular interstitium and peribronchovascular ground-glass attenuation and micronodularity are noted, most evident in the inferior aspect of the left lower lobe. Mild diffuse bronchial wall thickening with moderate centrilobular and mild paraseptal emphysema. Upper Abdomen: Aortic atherosclerosis. Musculoskeletal: Old healed midsternal fracture an old healed fracture of the anterolateral aspect of the left fifth rib are again noted. There are no aggressive appearing lytic or blastic lesions noted in the visualized portions of the skeleton. IMPRESSION: 1. Interval enlargement of an aggressive appearing nodule in the posteromedial aspect of the right lower lobe highly concerning for neoplasm. Further evaluation with repeat PET-CT is recommended at this time. 2.  Diffuse bronchial wall thickening with moderate centrilobular and mild paraseptal emphysema; imaging findings suggestive of underlying COPD. 3. Aortic atherosclerosis, in addition to left main and 3 vessel coronary artery disease. Please note that although the presence of coronary artery calcium documents the presence of coronary artery disease, the severity of this disease and any potential stenosis cannot be assessed on this non-gated CT examination. Assessment for potential risk factor modification, dietary therapy or pharmacologic therapy may be warranted, if clinically indicated. 4. Additional incidental findings, as above. These results will be called to the ordering clinician or representative by the Radiologist Assistant, and communication documented in  the PACS or zVision Dashboard. Aortic Atherosclerosis (ICD10-I70.0) and Emphysema (ICD10-J43.9). Electronically Signed   By: Vinnie Langton M.D.   On: 11/25/2016 09:38   Nm Pet Image Restag (ps) Skull Base To Thigh  Result Date: 11/27/2016 CLINICAL DATA:  Subsequent treatment strategy for pulmonary nodule. EXAM: NUCLEAR MEDICINE PET SKULL BASE TO THIGH TECHNIQUE: 10 mCi F-18 FDG was injected intravenously. Full-ring PET imaging was performed from the skull base to thigh after the radiotracer. CT data was obtained and used for attenuation correction and anatomic localization. FASTING BLOOD GLUCOSE:  Value: 96 mg/dl COMPARISON:  05/14/2016 FINDINGS: NECK: No hypermetabolic lymph nodes in the neck. CHEST: 9 mm irregular opacity posterior right upper lobe (image 16 series 8) is not substantially changed in the interval and religious represents architectural distortion, but has demonstrable FDG accumulation with SUV max = 1.7. Posterior right lower lobe lesion in question measures 2.2 cm on CT imaging and demonstrates FDG uptake with SUV max = 2.4. Emphysema noted bilaterally. There is biapical pleural-parenchymal scarring. Coronary artery and thoracic  aortic atherosclerosis. ABDOMEN/PELVIS: No abnormal hypermetabolic activity within the liver, pancreas, adrenal glands, or spleen. No hypermetabolic lymph nodes in the abdomen or pelvis. 2 cm hypoattenuating lesion subcapsular medial segment left liver shows no hypermetabolism. Probable tiny gallstone. There is abdominal aortic atherosclerosis without aneurysm. SKELETON: No focal hypermetabolic activity to suggest skeletal metastasis. IMPRESSION: 1. FDG uptake in the posterior right lower lobe pulmonary nodule in question is identified although relatively low level. Nevertheless given progressing size and demonstrable FDG accumulation, neoplasm is suspected. 2. Second tiny 9 mm parenchymal irregularity posterior right upper lobe demonstrates FDG uptake. Although uptake is low, given the tiny irregular nature of this lesion, close follow-up is recommended as neoplasm cannot be excluded. 3.  Aortic Atherosclerois (ICD10-170.0) 4.  Emphysema. (ICD10-J43.9). Electronically Signed   By: Misty Stanley M.D.   On: 11/27/2016 13:35    Labs:  CBC: Recent Labs    12/02/16 1300 12/09/16 0846  WBC 9.0 8.4  HGB 12.6 12.5  HCT 39.6 38.3  PLT 232 212    COAGS: Recent Labs    12/09/16 0846  INR 0.92  APTT 31    BMP: Recent Labs    12/02/16 1300  NA 136  K 3.8  CL 103  CO2 23  GLUCOSE 92  BUN 14  CALCIUM 9.3  CREATININE 0.86  GFRNONAA >60  GFRAA >60    LIVER FUNCTION TESTS: No results for input(s): BILITOT, AST, ALT, ALKPHOS, PROT, ALBUMIN in the last 8760 hours.  TUMOR MARKERS: No results for input(s): AFPTM, CEA, CA199, CHROMGRNA in the last 8760 hours.  Assessment and Plan: RLL nodule For CT guided perc biopsy Labs ok Risks and benefits discussed with the patient including, but not limited to bleeding, hemoptysis, respiratory failure requiring intubation, infection, pneumothorax requiring chest tube placement, stroke from air embolism or even death. All of the patient's questions  were answered, patient is agreeable to proceed. Consent signed and in chart.    Thank you for this interesting consult.  I greatly enjoyed meeting Ellen Bowers and look forward to participating in their care.  A copy of this report was sent to the requesting provider on this date.  Electronically Signed: Ascencion Dike, PA-C 12/09/2016, 9:58 AM   I spent a total of 20 minutes in face to face in clinical consultation, greater than 50% of which was counseling/coordinating care for lung biopsy

## 2016-12-09 NOTE — Discharge Instructions (Signed)
Needle Biopsy of the Lung, Care After °This sheet gives you information about how to care for yourself after your procedure. Your health care provider may also give you more specific instructions. If you have problems or questions, contact your health care provider. °What can I expect after the procedure? °After the procedure, it is common to have: °· Soreness, pain, and tenderness where a tissue sample was taken (biopsy site). °· A cough. °· A sore throat. ° °Follow these instructions at home: °Biopsy site care °· Follow instructions from your health care provider about when to remove the bandage that was placed on the biopsy site. °· Keep the bandage dry until it has been removed. °· Check your biopsy site every day for signs of infection. Check for: °? More redness, swelling, or pain. °? More fluid or blood. °? Warmth to the touch. °? Pus or a bad smell. °General instructions °· Rest as directed by your health care provider. Ask your health care provider what activities are safe for you. °· Do not take baths, swim, or use a hot tub until your health care provider approves. °· Take over-the-counter and prescription medicines only as told by your health care provider. °· If you have airplane travel scheduled, talk with your health care provider about when it is safe for you to travel by airplane. °· It is up to you to get the results of your procedure. Ask your health care provider, or the department that is doing the procedure, when your results will be ready. °· Keep all follow-up visits as told by your health care provider. This is important. °Contact a health care provider if: °· You have more redness, swelling, or pain around your biopsy site. °· You have more fluid or blood coming from your biopsy site. °· Your biopsy site feels warm to the touch. °· You have pus or a bad smell coming from your biopsy site. °· You have a fever. °· You have pain that does not get better with medicine. °Get help right away  if: °· You have problems breathing. °· You have chest pain. °· You cough up blood. °· You faint. °· You have a fast heart rate. °Summary °· After a needle biopsy of the lung, it is common to have a cough, a sore throat, or soreness, pain, and tenderness where a tissue sample was taken (biopsy site). °· You should check your biopsy area every day for signs of infection, including pus or a bad smell, warmth, more fluid or blood, or more redness, swelling, or pain. °· You should not take baths, swim, or use a hot tub until your health care provider approves. °· It is up to you to get the results of your procedure. Ask your health care provider, or the department that is doing the procedure, when your results will be ready. °This information is not intended to replace advice given to you by your health care provider. Make sure you discuss any questions you have with your health care provider. °Document Released: 10/26/2006 Document Revised: 11/20/2015 Document Reviewed: 11/20/2015 °Elsevier Interactive Patient Education © 2017 [employer] ° ° °

## 2016-12-09 NOTE — Procedures (Signed)
Pre procedural Dx: Right lower lobe pulmonary nodule  Post procedural Dx: Same  Technically successful CT guided biopsy of indeterminate nodule within the right lower lobe.   EBL: None.   Complications: None immediate.   Ronny Bacon, MD Pager #: [phone]

## 2016-12-10 ENCOUNTER — Other Ambulatory Visit: Payer: Self-pay | Admitting: Pulmonary Disease

## 2016-12-10 DIAGNOSIS — R911 Solitary pulmonary nodule: Secondary | ICD-10-CM

## 2016-12-11 ENCOUNTER — Telehealth: Payer: Self-pay | Admitting: Gastroenterology

## 2016-12-11 ENCOUNTER — Encounter (HOSPITAL_COMMUNITY): Payer: Self-pay | Admitting: Gastroenterology

## 2016-12-11 MED ORDER — BACLOFEN 10 MG PO TABS: ORAL_TABLET | ORAL | 11 refills | Status: DC

## 2016-12-11 MED ORDER — BIS SUBCIT-METRONID-TETRACYC 140-125-125 MG PO CAPS: ORAL_CAPSULE | ORAL | 0 refills | Status: DC

## 2016-12-11 NOTE — Telephone Encounter (Signed)
Called patient TO DISCUSS RESULTS. She has H. Pylori gastritis. She has recently had amoxicillin(AUGMENTIN) and so she needs PYLERA 3 PILLS QID FOR 10 DAYS. She should CONTINUE PROTONIX BID. The meds can cause nausea, vomiting, abd cramps, loose stools, black colored stools, and metallic taste in her mouth.  Her BRAVO STUDY SHOWS PROTONIX BID WITH PRN MAALOX/ZANTAC CONTROLS YOUR ACID REFLUX. SHE DOES HAVE EPISODE OF REGURGITATION. THE BRAVO STUDY IS BETTER COMPARED TO 2012. SHE HAS NON-ULCER DYSPEPSIA, WHICH MEANS SHE FEELS ACID REFLUX PAIN WHEN NON-ACID SUBSTANCE REFLUX INTO HER ESOPHAGUS. SHE HAS REFLUX SYMPTOMS BUT IT IS NOT RELATED TO ACID.  SHE SHOULD ADD BACLOFEN 5 MG QAC TID. THE MEDS CAN CAUSE SEDATION OR DIZZINESS. PLEASE CALL WITH QUESTIONS OR CONCERNS REGARDING MEDS OR CALL IN ONE MONTH IF HER SYMPTOMS ARE NOT IMPROVED.    NEXT OPV IN MAR 2019 E30 DYSPEPSIA/H PYLORI GASTRITIS/GERD WITH DR. Trong Gosling ONLY.

## 2016-12-11 NOTE — Procedures (Signed)
FOOD DIARY INCLUDES: FREQUENT COFFEE, PEANUT BUTTER AND JELLY SANDWICH, EGG SALAD SANDWICH, CHICKEN KIEV, & SHRIMP SCAMPI.   POST-OPERATIVE DIAGNOSIS:  NON-ULCER DYSPEPSIA   PROCEDURE:  Procedure(s): BRAVO PH STUDY ON MAALOX PRN, PROTONIX BID, ZANTAC QHS PRN  SURGEON:  Surgeon(s): Dorothyann Peng, MD  FINDINGS:  PT HAD RECORDER FOR 1 DAY AND 15 HRS 23 MINS   FRACTION Ph <4 TOTAL:  DAY 1 1.6 DAY 2 0.3   # OF REFLUXES:    DAY 1 3  DAY 2 13   LONG REFLUX > 5:   DAY 1 1 DAY 2 0   LONGEST REFLUX:   DAY 1 20 DAY 2 1   REFLUX TABLE: UPRIGHT 16 EPISODES, SUPINE 0  DEMEESTER SCORE DAY 1:  6.3 (NL < 14.72)  DEMEESTER SCORE DAY 2:  2.1 SAP TABLE: ACID REFLUX ANALYSIS: 100 % SUPINE  DIAGNOSIS: NON-ULCER DYSPEPSIA  PLAN: 1. ADD BACLOFEN QAC TID 2. CONTINUE PROTONIX BID. 3. OPV IN [facility] 2019.

## 2016-12-14 NOTE — Telephone Encounter (Signed)
Noted  

## 2016-12-14 NOTE — Telephone Encounter (Signed)
On recall  °

## 2016-12-16 ENCOUNTER — Ambulatory Visit: Payer: Medicare Other | Admitting: Nurse Practitioner

## 2017-01-11 ENCOUNTER — Encounter: Payer: Medicare Other | Admitting: Thoracic Surgery (Cardiothoracic Vascular Surgery)

## 2017-01-20 ENCOUNTER — Other Ambulatory Visit: Payer: Self-pay | Admitting: *Deleted

## 2017-01-20 ENCOUNTER — Encounter: Payer: Self-pay | Admitting: Surgery

## 2017-01-20 ENCOUNTER — Other Ambulatory Visit: Payer: Self-pay

## 2017-01-20 ENCOUNTER — Institutional Professional Consult (permissible substitution): Payer: Medicare Other | Admitting: Surgery

## 2017-01-20 VITALS — BP 149/90 | HR 85 | Resp 18 | Ht 69.0 in | Wt 207.0 lb

## 2017-01-20 DIAGNOSIS — C3431 Malignant neoplasm of lower lobe, right bronchus or lung: Secondary | ICD-10-CM | POA: Diagnosis not present

## 2017-01-20 DIAGNOSIS — R911 Solitary pulmonary nodule: Secondary | ICD-10-CM

## 2017-01-23 ENCOUNTER — Encounter: Payer: Self-pay | Admitting: Surgery

## 2017-01-23 NOTE — Progress Notes (Signed)
Cardiothoracic Surgery Consultation  PCP is [name], MD Referring Provider is Noralee Space, MD  Chief Complaint  Patient presents with  . 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 Manuella Ghazi who was evaluated by Dr. Lenna Gilford in 04/2014 for a prolonged productive cough with some wheezing and hoarseness. She had a CT in 01/2013 that showed scattered sub-centimeter lung nodules. She has been followed since by Dr. Lenna Gilford with medical treatment of her COPD and bronchitis. She had a CT [facility] the chest [facility] on 04/23/2016 that showed advanced bilateral upper lobe emphysema and a 10 x 18 mm density in the RLL. She had a PET scan on 05/13/2016 that did not show any hypermetabolic activity in the nodule and the decision was made to follow this. She has continued to have problems with cough and shortness of breath diagnosed as acute bronchitis and COPD exacerbation. She was seen by Dr. Domenic Polite in 05/2016 for leg edema and echo was normal. The edema improved with lasix. She had a follow up CT on 11/24/2016 which showed the RLL nodule to be larger at 2.4 x 1.1 x 1.2 cm. She had a PET scan done on 11/27/2016 that showed low level hypermetabolic activity in the nodule with an SUV of 2.4. There was another small 9 mm irregular opacity in the RUL near the surface of the lung that had low level uptake with an SUV max of 1.7. 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  She is here with her husband today. She still works prn as a Merchandiser, retail.  Past Medical History:  Diagnosis Date  . Arthritis   . Asthma   . Blood dyscrasia   . COPD (chronic obstructive pulmonary disease) ([place])   . Depression   . Dyspnea   . GERD (gastroesophageal reflux disease)   . Helicobacter pylori gastritis 2005  . Hyperlipidemia   . Von  Willebrand disease ([place]) 1996    Past Surgical History:  Procedure Laterality Date  . BIOPSY  12/08/2016   Procedure: BIOPSY;  Surgeon: Danie Binder, MD;  [facility];  Service: Endoscopy;;  . BRAVO [place] STUDY N/A 12/08/2016   Procedure: BRAVO [place]; Bravo Capsule Placement;  Surgeon: Danie Binder, MD;  [facility];  Service: Endoscopy;  Laterality: N/A;  . BREAST LUMPECTOMY    . CATARACT EXTRACTION     double  . COLONOSCOPY N/A 05/30/2012   Dr.Mann:Multiple polyps removed from the right colon-see description above; otherwise normal colonoscopy up to the cecum. 5 polyps, multiple tubular adenomas. next tcs 05/2015  . COLONOSCOPY N/A 12/13/2015   Procedure: COLONOSCOPY;  Surgeon: Danie Binder, MD;  [facility];  Service: Endoscopy;  Laterality: N/A;  8:30 am  . ESOPHAGOGASTRODUODENOSCOPY  2012   Dr. Collene Mares: few erosions in duodenal bulb. BRAVO off PPI ("severe reflux")  . ESOPHAGOGASTRODUODENOSCOPY N/A 06/12/2014   SLF: 1. The mucosa of the esophagus appeared normal 2. Mild non-erosive gastrtitis.   [name] ESOPHAGOGASTRODUODENOSCOPY (EGD) WITH PROPOFOL N/A 12/08/2016   Procedure: ESOPHAGOGASTRODUODENOSCOPY (EGD) WITH PROPOFOL;  Surgeon: Danie Binder, MD;  [facility];  Service: Endoscopy;  Laterality: N/A;  7:30am  . FLEXIBLE SIGMOIDOSCOPY N/A 02/05/2014   Dr. Oneida Alar: 5, 2-3 mm sessile sigmoid colon polyps  removed, small internal hemorrhoids, moderate external hemorrhoids, 2 anal fissures present. Polyps were hyperplastic.   [name] NECK SURGERY  1951   cyst removal  . NECK SURGERY  1998   growth removed  . TONSILLECTOMY  1951  . TUBAL LIGATION  1984    Family History  Problem Relation Age of Onset  . Colon cancer Mother        Less than age 53  . Colon cancer Father        More than age 76, died from metastatic disease at age 12  . Liver disease Neg Hx     Social History Social History   Tobacco Use  . Smoking status: Former  Smoker    Packs/day: 1.00    Years: 50.00    Pack years: 50.00    Types: Cigarettes    Start date: 03/17/1961    Last attempt to quit: 05/30/2009    Years since quitting: 7.6  . Smokeless tobacco: Never Used  . Tobacco comment: Quit smoking x 7 years  Substance Use Topics  . Alcohol use: Yes    Comment: Occasionally    . Drug use: No    Current Outpatient Medications  Medication Sig Dispense Refill  . acetaminophen (TYLENOL) 500 MG tablet Take 1,000 mg by mouth 2 (two) times daily as needed for moderate pain or headache.    [name] alum & mag hydroxide-simeth (MAALOX/MYLANTA) 200-200-20 MG/5ML suspension Take 15 mLs by mouth as needed for indigestion or heartburn.    . Ascorbic Acid (VITAMIN C) 1000 MG tablet Take 1,000 mg by mouth daily.    [name] aspirin 81 MG tablet Take 81 mg by mouth daily.    [name] azelastine (ASTELIN) 0.1 % nasal spray Place 2 sprays into both nostrils daily as needed for rhinitis. Use in each nostril as directed    . b complex vitamins tablet Take 1 tablet by mouth daily.    [name] bismuth-metronidazole-tetracycline (PYLERA) 140-125-125 MG capsule 3 PO QID FOR 10 DAYS (Patient taking differently: 2-3 PO QID FOR 10 DAYS) 120 capsule 0  . budesonide (PULMICORT) 0.5 MG/2ML nebulizer solution Takes as needed    . buPROPion (WELLBUTRIN SR) 200 MG 12 hr tablet Take 200 mg by mouth 2 (two) times daily.    . Ca Carbonate-Mag Hydroxide (ROLAIDS PO) Take 2 tablets by mouth as needed.    [name] CALCIUM-MAGNESIUM PO Take 1 tablet by mouth every evening.    . celecoxib (CELEBREX) 200 MG capsule Take 200 mg by mouth daily.     . cetirizine (ZYRTEC) 10 MG tablet Take 10 mg by mouth daily.    [name] docusate sodium (COLACE) 100 MG capsule Take 100 mg by mouth 2 (two) times daily.     [name] doxylamine, Sleep, (UNISOM) 25 MG tablet Take 12.5 mg by mouth at bedtime.    . fluticasone (FLONASE) 50 MCG/ACT nasal spray Place 2 sprays into both nostrils daily as needed for allergies or rhinitis.    .  Fluticasone-Salmeterol (ADVAIR) 500-50 MCG/DOSE AEPB Inhale 1 puff into the lungs 2 (two) times daily.     [name] HYDROcodone-homatropine (HYCODAN) 5-1.5 MG/5ML syrup Take 5 mLs by mouth every 6 (six) hours as needed for cough. 120 mL 0  . ipratropium-albuterol (DUONEB) 0.5-2.5 (3) MG/3ML SOLN Take 3 mLs by nebulization 3 (three) times daily. 360 mL 11  . montelukast (SINGULAIR) 10 MG tablet Take 1 tablet by mouth daily.    [name] OVER THE COUNTER MEDICATION Take 1 tablet by mouth at  bedtime. "midnight" dietary supplement    . pantoprazole (PROTONIX) 40 MG tablet Take 1 tablet (40 mg total) by mouth 2 (two) times daily. 30 minutes before breakfast and dinner 60 tablet 6  . pravastatin (PRAVACHOL) 20 MG tablet Take 20 mg by mouth at bedtime.     . ranitidine (ZANTAC) 150 MG tablet Take 150 mg at bedtime as needed by mouth for heartburn.    . solifenacin (VESICARE) 5 MG tablet Take 5 mg by mouth every evening.      No current facility-administered medications for this visit.     Allergies  Allergen Reactions  . Demerol [Meperidine] Nausea And Vomiting  . Betadine [Povidone Iodine] Rash    Topical only    Review of Systems  Constitutional: Negative for activity change, appetite change and fatigue.  HENT: Negative.   Eyes: Negative.   Respiratory: Positive for cough, shortness of breath and wheezing. Negative for chest tightness.        With exertion  Cardiovascular: Negative for chest pain, palpitations and leg swelling.  Gastrointestinal:       Reflux  Endocrine: Negative.   Genitourinary: Negative.   Musculoskeletal: Positive for arthralgias.  Skin: Negative.   Allergic/Immunologic: Negative.   Neurological:       Memory problems  Hematological: Negative.   Psychiatric/Behavioral:       Depression    BP (!) 149/90 (BP Location: Left Arm, Patient Position: Sitting, Cuff Size: Normal)   Pulse 85   Resp 18   Ht 5\' 9"  (1.753 m)   Wt 207 lb (93.9 kg)   SpO2 98% Comment: RA  BMI 30.57  kg/m  Physical Exam  Constitutional: She is oriented to person, place, and time. She appears well-developed and well-nourished. She appears distressed.  HENT:  Head: Normocephalic and atraumatic.  Mouth/Throat: Oropharynx is clear and moist.  Eyes: Conjunctivae and EOM are normal. Pupils are equal, round, and reactive to light.  Neck: Normal range of motion. Neck supple. No JVD present. No thyromegaly present.  Cardiovascular: Normal rate, regular rhythm, normal heart sounds and intact distal pulses.  No murmur heard. Pulmonary/Chest: Effort normal and breath sounds normal. No respiratory distress. She has no wheezes. She has no rales. She exhibits no tenderness.  Abdominal: Soft. Bowel sounds are normal. She exhibits no distension and no mass. There is no tenderness.  Musculoskeletal: Normal range of motion. She exhibits no edema.  Lymphadenopathy:    She has no cervical adenopathy.  Neurological: She is alert and oriented to person, place, and time. She has normal strength. No cranial nerve deficit or sensory deficit.  Skin: Skin is warm and dry.  Psychiatric: She has a normal mood and affect.     Diagnostic Tests:  EXAM: CT CHEST WITHOUT CONTRAST  TECHNIQUE: Multidetector CT imaging of the chest was performed following the standard protocol without IV contrast.  COMPARISON:  PET-CT 05/14/2016.  Chest CT 04/23/2016.  FINDINGS: Cardiovascular: Heart size is normal. There is no significant pericardial fluid, thickening or pericardial calcification. There is aortic atherosclerosis, as well as atherosclerosis of the great vessels of the mediastinum and the coronary arteries, including calcified atherosclerotic plaque in the left main, left anterior descending, left circumflex and right coronary arteries.  Mediastinum/Nodes: No pathologically enlarged mediastinal or hilar lymph nodes. Please note that accurate exclusion of hilar adenopathy is limited on noncontrast CT  scans. Esophagus is unremarkable in appearance. No axillary lymphadenopathy.  Lungs/Pleura: Previously noted right lower lobe pulmonary nodule has increased in size in  appears more solid than the prior examination, currently measuring 2.4 x 1.1 x 1.2 cm (axial image 97 of series 4, and coronal image 124 of series 5), previously only 1.8 x 1.0 x 0.7 cm on 04/23/2016. This nodule has an aggressive appearance with macrolobulated slightly spiculated margins and appears to contact the overlying pleura posteriorly. No other definite suspicious appearing pulmonary nodules or masses are noted. No acute consolidative airspace disease. No pleural effusions. A few scattered areas of mild cylindrical bronchiectasis, peripheral bronchiolectasis, thickening of the peribronchovascular interstitium and peribronchovascular ground-glass attenuation and micronodularity are noted, most evident in the inferior aspect of the left lower lobe. Mild diffuse bronchial wall thickening with moderate centrilobular and mild paraseptal emphysema.  Upper Abdomen: Aortic atherosclerosis.  Musculoskeletal: Old healed midsternal fracture an old healed fracture of the anterolateral aspect of the left fifth rib are again noted. There are no aggressive appearing lytic or blastic lesions noted in the visualized portions of the skeleton.  IMPRESSION: 1. Interval enlargement of an aggressive appearing nodule in the posteromedial aspect of the right lower lobe highly concerning for neoplasm. Further evaluation with repeat PET-CT is recommended at this time. 2. Diffuse bronchial wall thickening with moderate centrilobular and mild paraseptal emphysema; imaging findings suggestive of underlying COPD. 3. Aortic atherosclerosis, in addition to left main and 3 vessel coronary artery disease. Please note that although the presence of coronary artery calcium documents the presence of coronary artery disease, the severity of  this disease and any potential stenosis cannot be assessed on this non-gated CT examination. Assessment for potential risk factor modification, dietary therapy or pharmacologic therapy may be warranted, if clinically indicated. 4. Additional incidental findings, as above. These results will be called to the ordering clinician or representative by the Radiologist Assistant, and communication documented in the PACS or zVision Dashboard.  Aortic Atherosclerosis (ICD10-I70.0) and Emphysema (ICD10-J43.9).   Electronically Signed   By: Vinnie Langton M.D.   On: 11/25/2016 09:38  CLINICAL DATA:  Subsequent treatment strategy for pulmonary nodule.  EXAM: NUCLEAR MEDICINE PET SKULL BASE TO THIGH  TECHNIQUE: 10 mCi F-18 FDG was injected intravenously. Full-ring PET imaging was performed from the skull base to thigh after the radiotracer. CT data was obtained and used for attenuation correction and anatomic localization.  FASTING BLOOD GLUCOSE:  Value: 96 mg/dl  COMPARISON:  05/14/2016  FINDINGS: NECK: No hypermetabolic lymph nodes in the neck.  CHEST: 9 mm irregular opacity posterior right upper lobe (image 16 series 8) is not substantially changed in the interval and religious represents architectural distortion, but has demonstrable FDG accumulation with SUV max = 1.7.  Posterior right lower lobe lesion in question measures 2.2 cm on CT imaging and demonstrates FDG uptake with SUV max = 2.4.  Emphysema noted bilaterally. There is biapical pleural-parenchymal scarring. Coronary artery and thoracic aortic atherosclerosis.  ABDOMEN/PELVIS: No abnormal hypermetabolic activity within the liver, pancreas, adrenal glands, or spleen. No hypermetabolic lymph nodes in the abdomen or pelvis.  2 cm hypoattenuating lesion subcapsular medial segment left liver shows no hypermetabolism. Probable tiny gallstone. There is abdominal aortic atherosclerosis without  aneurysm.  SKELETON: No focal hypermetabolic activity to suggest skeletal metastasis.  IMPRESSION: 1. FDG uptake in the posterior right lower lobe pulmonary nodule in question is identified although relatively low level. Nevertheless given progressing size and demonstrable FDG accumulation, neoplasm is suspected. 2. Second tiny 9 mm parenchymal irregularity posterior right upper lobe demonstrates FDG uptake. Although uptake is low, given the tiny irregular nature of this  lesion, close follow-up is recommended as neoplasm cannot be excluded. 3.  Aortic Atherosclerois (ICD10-170.0) 4.  Emphysema. (ICD10-J43.9).   Electronically Signed   By: Misty Stanley M.D.   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 I think surgical resection of the RLL cancer with either lobectomy or wedge resection is the best treatment. The RUL opacity is close to the surface and may be resectable at the same time. She will require full PFT's with diffusion capacity and a room air ABG prior to making a decision about surgery. She has significant bilateral upper lobe emphysema on CT and chronic exertional shortness of breath and I am not sure that she would do well with a right lower lobectomy. I reviewed the CT and PET images with her and her husband and answered their questions. She is not having any chest discomfort and was evaluated by Dr. Domenic Polite last May with a normal echo.    Plan:  She will have PFT's with a diffusion capacity and RA ABG and then will return to see me to discuss the results and make surgical plans. I gave her the option for me to call her with the results if she would like to avoid running back down to my office.    I spent 60 minutes performing this consultation and > 50% of this time was spent face to face counseling and coordinating the care of this  patient's lung cancer.   Gaye Pollack, MD [facility] [phone]

## 2017-01-26 ENCOUNTER — Ambulatory Visit (HOSPITAL_COMMUNITY)
Admission: RE | Admit: 2017-01-26 | Discharge: 2017-01-26 | Disposition: A | Discharge status: Home / Self Care | Payer: Medicare Other | Source: Ambulatory Visit | Attending: Surgery | Admitting: Surgery

## 2017-01-26 DIAGNOSIS — R911 Solitary pulmonary nodule: Secondary | ICD-10-CM | POA: Insufficient documentation

## 2017-01-26 LAB — PULMONARY FUNCTION TEST
DL/VA % pred: 64 %
DL/VA: 3.47 ml/min/mmHg/L
DLCO unc % pred: 48 %
DLCO unc: 15.33 ml/min/mmHg
FEF 25-75 Post: 1.56 L/sec
FEF 25-75 Pre: 1.31 L/sec
FEF2575-%CHANGE-POST: 19 %
FEF2575-%PRED-PRE: 61 %
FEF2575-%Pred-Post: 73 %
FEV1-%CHANGE-POST: 6 %
FEV1-%PRED-POST: 71 %
FEV1-%PRED-PRE: 67 %
FEV1-PRE: 1.85 L
FEV1-Post: 1.97 L
FEV1FVC-%CHANGE-POST: -3 %
FEV1FVC-%Pred-Pre: 93 %
FEV6-%Change-Post: 8 %
FEV6-%PRED-PRE: 74 %
FEV6-%Pred-Post: 80 %
FEV6-PRE: 2.59 L
FEV6-Post: 2.81 L
FEV6FVC-%Change-Post: -1 %
FEV6FVC-%PRED-POST: 102 %
FEV6FVC-%PRED-PRE: 104 %
FVC-%CHANGE-POST: 9 %
FVC-%PRED-PRE: 71 %
FVC-%Pred-Post: 78 %
FVC-POST: 2.86 L
FVC-PRE: 2.61 L
POST FEV6/FVC RATIO: 98 %
PRE FEV6/FVC RATIO: 100 %
Post FEV1/FVC ratio: 69 %
Pre FEV1/FVC ratio: 71 %
RV % PRED: 108 %
RV: 2.71 L
TLC % pred: 100 %
TLC: 5.91 L

## 2017-01-26 MED ORDER — ALBUTEROL SULFATE (2.5 MG/3ML) 0.083% IN NEBU
2.5000 mg | INHALATION_SOLUTION | Freq: Once | RESPIRATORY_TRACT | Status: AC
  Administered 2017-01-26: 2.5 mg via RESPIRATORY_TRACT

## 2017-01-27 ENCOUNTER — Encounter: Payer: Medicare Other | Admitting: Surgery

## 2017-01-29 ENCOUNTER — Other Ambulatory Visit: Payer: Self-pay

## 2017-01-29 ENCOUNTER — Other Ambulatory Visit: Payer: Self-pay | Admitting: *Deleted

## 2017-01-29 ENCOUNTER — Encounter: Payer: Self-pay | Admitting: Surgery

## 2017-01-29 ENCOUNTER — Ambulatory Visit: Payer: Medicare Other | Admitting: Surgery

## 2017-01-29 VITALS — BP 143/74 | HR 93 | Resp 18 | Ht 69.0 in | Wt 209.0 lb

## 2017-01-29 DIAGNOSIS — R911 Solitary pulmonary nodule: Secondary | ICD-10-CM

## 2017-01-29 DIAGNOSIS — C3431 Malignant neoplasm of lower lobe, right bronchus or lung: Secondary | ICD-10-CM | POA: Diagnosis not present

## 2017-01-30 ENCOUNTER — Encounter: Payer: Self-pay | Admitting: Surgery

## 2017-01-30 NOTE — Progress Notes (Signed)
HPI:  Ellen Bowers returns today to discuss the results of her recent pulmonary function testing and make further plans for treatment of her right lower lobe squamous cell carcinoma.  Her PFTs show an FEV1 of 1.85 which is 67% of predicted.  There is no significant improvement with bronchodilators.  Her diffusion capacity is 48% of predicted.  Current Outpatient Medications  Medication Sig Dispense Refill  . acetaminophen (TYLENOL) 500 MG tablet Take 1,000 mg by mouth 2 (two) times daily as needed for moderate pain or headache.    [name] alum & mag hydroxide-simeth (MAALOX/MYLANTA) 200-200-20 MG/5ML suspension Take 15 mLs by mouth as needed for indigestion or heartburn.    . Ascorbic Acid (VITAMIN C) 1000 MG tablet Take 1,000 mg by mouth daily.    [name] aspirin 81 MG tablet Take 81 mg by mouth daily.    [name] azelastine (ASTELIN) 0.1 % nasal spray Place 2 sprays into both nostrils daily as needed for rhinitis. Use in each nostril as directed    . b complex vitamins tablet Take 1 tablet by mouth daily.    [name] bismuth-metronidazole-tetracycline (PYLERA) 140-125-125 MG capsule 3 PO QID FOR 10 DAYS (Patient taking differently: 2-3 PO QID FOR 10 DAYS) 120 capsule 0  . budesonide (PULMICORT) 0.5 MG/2ML nebulizer solution Takes as needed    . buPROPion (WELLBUTRIN SR) 200 MG 12 hr tablet Take 200 mg by mouth 2 (two) times daily.    . Ca Carbonate-Mag Hydroxide (ROLAIDS PO) Take 2 tablets by mouth as needed.    [name] CALCIUM-MAGNESIUM PO Take 1 tablet by mouth every evening.    . celecoxib (CELEBREX) 200 MG capsule Take 200 mg by mouth daily.     . cetirizine (ZYRTEC) 10 MG tablet Take 10 mg by mouth daily.    [name] docusate sodium (COLACE) 100 MG capsule Take 100 mg by mouth 2 (two) times daily.     [name] doxylamine, Sleep, (UNISOM) 25 MG tablet Take 12.5 mg by mouth at bedtime.    . fluticasone (FLONASE) 50 MCG/ACT nasal spray Place 2 sprays into both nostrils daily as needed for allergies or rhinitis.    .  Fluticasone-Salmeterol (ADVAIR) 500-50 MCG/DOSE AEPB Inhale 1 puff into the lungs 2 (two) times daily.     [name] HYDROcodone-homatropine (HYCODAN) 5-1.5 MG/5ML syrup Take 5 mLs by mouth every 6 (six) hours as needed for cough. 120 mL 0  . ipratropium-albuterol (DUONEB) 0.5-2.5 (3) MG/3ML SOLN Take 3 mLs by nebulization 3 (three) times daily. 360 mL 11  . montelukast (SINGULAIR) 10 MG tablet Take 1 tablet by mouth daily.    [name] OVER THE COUNTER MEDICATION Take 1 tablet by mouth at bedtime. "midnight" dietary supplement    . pantoprazole (PROTONIX) 40 MG tablet Take 1 tablet (40 mg total) by mouth 2 (two) times daily. 30 minutes before breakfast and dinner 60 tablet 6  . pravastatin (PRAVACHOL) 20 MG tablet Take 20 mg by mouth at bedtime.     . ranitidine (ZANTAC) 150 MG tablet Take 150 mg at bedtime as needed by mouth for heartburn.    . solifenacin (VESICARE) 5 MG tablet Take 5 mg by mouth every evening.      No current facility-administered medications for this visit.      Physical Exam: BP (!) 143/74 (BP Location: Left Arm, Patient Position: Sitting, Cuff Size: Large)   Pulse 93   Resp 18   Ht 5\' 9"  (1.753 m)   Wt 209 lb (94.8  kg)   SpO2 94% Comment: RA  BMI 30.86 kg/m  She looks well. Lungs are clear.  Diagnostic Tests:   Ref Range & Units 4d ago  FVC-Pre L 2.61   FVC-%Pred-Pre % 71   FVC-Post L 2.86   FVC-%Pred-Post % 78   FVC-%Change-Post % 9   FEV1-Pre L 1.85   FEV1-%Pred-Pre % 67   FEV1-Post L 1.97   FEV1-%Pred-Post % 71   FEV1-%Change-Post % 6   FEV6-Pre L 2.59   FEV6-%Pred-Pre % 74   FEV6-Post L 2.81   FEV6-%Pred-Post % 80   FEV6-%Change-Post % 8   Pre FEV1/FVC ratio % 71   FEV1FVC-%Pred-Pre % 93   Post FEV1/FVC ratio % 69   FEV1FVC-%Change-Post % -3   Pre FEV6/FVC Ratio % 100   FEV6FVC-%Pred-Pre % 104   Post FEV6/FVC ratio % 98   FEV6FVC-%Pred-Post % 102   FEV6FVC-%Change-Post % -1   FEF 25-75 Pre L/sec 1.31   FEF2575-%Pred-Pre % 61   FEF 25-75 Post L/sec  1.56   FEF2575-%Pred-Post % 73   FEF2575-%Change-Post % 19   RV L 2.71   RV % pred % 108   TLC L 5.91   TLC % pred % 100   DLCO unc ml/min/mmHg 15.33   DLCO unc % pred % 48   DL/VA ml/min/mmHg/L 3.47   DL/VA % pred % 64   Resulting Agency  [facility]      Specimen Collected: 01/26/17 09:47 Last Resulted: 01/26/17 10:55       Impression:  She has a small peripheral right lower lobe squamous cell carcinoma and a tiny nodular density in the periphery of the right upper lobe that had trace hypermetabolic activity and is indeterminate.  Her pulmonary function testing shows moderate obstructive lung disease as well as a moderate reduction in her diffusion capacity.  She has some baseline shortness of breath with exertion but no chest discomfort.  Her right lower lobe lung lesion is probably amenable to wedge resection which would minimize the loss of functional lung tissue.  Her CT scan shows extensive emphysematous changes primarily in the upper lobes and I think that a right lower lobectomy would remove a significant amount of functional lung.  I think her PFTs are probably adequate to perform right lower lobectomy but she may end up with more shortness of breath and she has now and could be oxygen dependent.  I told her that I would try to perform a wedge resection if at all possible and would only perform a right lower lobectomy if the surgical margin was positive.  The right upper lobe peripheral density may be visible at the time of surgery and if it is I would also a small wedge resection.  I discussed the operative procedure of right thoracoscopy or possibly thoracotomy for wedge resection or right lower lobectomy.  I discussed alternatives, benefits, and risks including but not limited to bleeding, blood transfusion, infection, prolonged air leak, bronchial stump complications, persistent space, respiratory failure, worsening chronic shortness of breath and oxygen dependence, and recurrent  cancer.  She understands all this and agrees to proceed with surgery.  Plan:  She will be scheduled for surgery on Monday, 02/08/2017.   I spent 10 minutes performing this established patient evaluation and > 50% of this time was spent face to face counseling and coordinating the care of this patient's lung cancer  Gaye Pollack, MD [facility] [phone]

## 2017-02-04 ENCOUNTER — Other Ambulatory Visit: Payer: Self-pay

## 2017-02-04 ENCOUNTER — Encounter (HOSPITAL_COMMUNITY): Payer: Self-pay

## 2017-02-04 ENCOUNTER — Encounter (HOSPITAL_COMMUNITY)
Admission: RE | Admit: 2017-02-04 | Discharge: 2017-02-04 | Disposition: A | Discharge status: Home / Self Care | Payer: Medicare Other | Source: Ambulatory Visit | Attending: Surgery | Admitting: Surgery

## 2017-02-04 ENCOUNTER — Encounter: Payer: Self-pay | Admitting: Gastroenterology

## 2017-02-04 DIAGNOSIS — K6289 Other specified diseases of anus and rectum: Secondary | ICD-10-CM | POA: Insufficient documentation

## 2017-02-04 DIAGNOSIS — K625 Hemorrhage of anus and rectum: Secondary | ICD-10-CM | POA: Insufficient documentation

## 2017-02-04 DIAGNOSIS — Z01812 Encounter for preprocedural laboratory examination: Secondary | ICD-10-CM | POA: Insufficient documentation

## 2017-02-04 DIAGNOSIS — J449 Chronic obstructive pulmonary disease, unspecified: Secondary | ICD-10-CM | POA: Diagnosis not present

## 2017-02-04 DIAGNOSIS — K649 Unspecified hemorrhoids: Secondary | ICD-10-CM | POA: Insufficient documentation

## 2017-02-04 DIAGNOSIS — K219 Gastro-esophageal reflux disease without esophagitis: Secondary | ICD-10-CM | POA: Diagnosis not present

## 2017-02-04 DIAGNOSIS — Z01810 Encounter for preprocedural cardiovascular examination: Secondary | ICD-10-CM | POA: Insufficient documentation

## 2017-02-04 DIAGNOSIS — Z800 Family history of malignant neoplasm of digestive organs: Secondary | ICD-10-CM | POA: Insufficient documentation

## 2017-02-04 DIAGNOSIS — C3430 Malignant neoplasm of lower lobe, unspecified bronchus or lung: Secondary | ICD-10-CM | POA: Diagnosis not present

## 2017-02-04 DIAGNOSIS — G8929 Other chronic pain: Secondary | ICD-10-CM | POA: Insufficient documentation

## 2017-02-04 DIAGNOSIS — R911 Solitary pulmonary nodule: Secondary | ICD-10-CM | POA: Insufficient documentation

## 2017-02-04 DIAGNOSIS — R1013 Epigastric pain: Secondary | ICD-10-CM | POA: Insufficient documentation

## 2017-02-04 HISTORY — DX: Nonspecific reaction to tuberculin skin test without active tuberculosis: R76.11

## 2017-02-04 HISTORY — DX: Anxiety disorder, unspecified: F41.9

## 2017-02-04 HISTORY — DX: Malignant (primary) neoplasm, unspecified: C80.1

## 2017-02-04 LAB — COMPREHENSIVE METABOLIC PANEL
ALBUMIN: 3.9 g/dL (ref 3.5–5.0)
ALK PHOS: 88 U/L (ref 38–126)
ALT: 19 U/L (ref 14–54)
AST: 20 U/L (ref 15–41)
Anion gap: 12 (ref 5–15)
BUN: 14 mg/dL (ref 6–20)
CALCIUM: 9.2 mg/dL (ref 8.9–10.3)
CO2: 19 mmol/L — AB (ref 22–32)
CREATININE: 0.87 mg/dL (ref 0.44–1.00)
Chloride: 101 mmol/L (ref 101–111)
GFR calc Af Amer: >60 mL/min (ref >60)
GFR calc non Af Amer: >60 mL/min (ref >60)
GLUCOSE: 102 mg/dL — AB (ref 65–99)
Potassium: 4.3 mmol/L (ref 3.5–5.1)
SODIUM: 132 mmol/L — AB (ref 135–145)
Total Bilirubin: 0.6 mg/dL (ref 0.3–1.2)
Total Protein: 6.8 g/dL (ref 6.5–8.1)

## 2017-02-04 LAB — URINALYSIS, ROUTINE W REFLEX MICROSCOPIC
Bilirubin Urine: NEGATIVE
Glucose, UA: NEGATIVE mg/dL
HGB URINE DIPSTICK: NEGATIVE
KETONES UR: NEGATIVE mg/dL
LEUKOCYTES UA: NEGATIVE
Nitrite: NEGATIVE
PROTEIN: NEGATIVE mg/dL
Specific Gravity, Urine: 1.008 (ref 1.005–1.030)
pH: 5 (ref 5.0–8.0)

## 2017-02-04 LAB — PROTIME-INR
INR: 0.95
Prothrombin Time: 12.6 seconds (ref 11.4–15.2)

## 2017-02-04 LAB — BLOOD GAS, ARTERIAL
Acid-base deficit: 0.1 mmol/L (ref 0.0–2.0)
Bicarbonate: 23.3 mmol/L (ref 20.0–28.0)
Drawn by: 421801
FIO2: 21
O2 Saturation: 96.9 %
Patient temperature: 98.6
pCO2 arterial: 33.1 mmHg (ref 32.0–48.0)
pH, Arterial: 7.461 — ABNORMAL HIGH (ref 7.350–7.450)
pO2, Arterial: 84.4 mmHg (ref 83.0–108.0)

## 2017-02-04 LAB — CBC
HCT: 40.7 % (ref 36.0–46.0)
HEMOGLOBIN: 13.3 g/dL (ref 12.0–15.0)
MCH: 28.5 pg (ref 26.0–34.0)
MCHC: 32.7 g/dL (ref 30.0–36.0)
MCV: 87.2 fL (ref 78.0–100.0)
Platelets: 229 10*3/uL (ref 150–400)
RBC: 4.67 MIL/uL (ref 3.87–5.11)
RDW: 15.1 % (ref 11.5–15.5)
WBC: 8.5 10*3/uL (ref 4.0–10.5)

## 2017-02-04 LAB — SURGICAL PCR SCREEN
MRSA, PCR: NEGATIVE
Staphylococcus aureus: NEGATIVE

## 2017-02-04 LAB — TYPE AND SCREEN
ABO/RH(D): O POS
Antibody Screen: NEGATIVE

## 2017-02-04 LAB — ABO/RH: ABO/RH(D): O POS

## 2017-02-04 LAB — APTT: APTT: 29 s (ref 24–36)

## 2017-02-04 NOTE — Pre-Procedure Instructions (Signed)
    Ellen Bowers  02/04/2017      [facility] - [place], [address], [place] [address] [place] [address] Phone: [phone] Fax: [phone]  [facility] [place], [place] [address] Phone: [phone] Fax: [phone]    Your procedure is scheduled on Monday, February 08, 2017  Report to [facility] Admitting at 5:30 A.M.  Call this number if you have problems the morning of surgery:  [phone]   Remember: Follow doctors instructions regarding Aspirin  Do not eat food or drink liquids after midnight Sunday, February 07, 2017  Take these medicines the morning of surgery with A SIP OF WATER:   buPROPion (WELLBUTRIN SR),  cetirizine (ZYRTEC),  montelukast (SINGULAIR),  pantoprazole (PROTONIX),  Fluticasone-Salmeterol (ADVAIR),  prednisoLONE acetate (PRED FORTE) eye drops,  ipratropium-albuterol (DUONEB) SOLN nebulizer treatment  If needed: acetaminophen (TYLENOL) for pain,  REFRESH PLUS eye drops for dry eyes,   nasal spray for allergies ( Astelin or Flonase),  albuterol (PROVENTIL HFA;VENTOLIN HFA)  Inhaler for wheezing or shortness of breath ( Bring inhaler in with you on day of surgery).  Stop taking vitamins fish oil  and herbal medications such as Misc Natural Products (MIDNITE ) . Do not take any NSAIDs ie: diclofenac (FLECTOR) patch, celecoxib (CELEBREX), Ibuprofen, Advil, Naproxen (Aleve), Motrin, BC and Goody Powder; stop now.   Do not wear jewelry, make-up or nail polish.  Do not wear lotions, powders, or perfumes, or deodorant.  Do not shave 48 hours prior to surgery.    Do not bring valuables to the hospital.  [facility] is not responsible for any belongings or valuables.  Contacts, dentures or bridgework may not be worn into surgery.  Leave your suitcase in the car.  After surgery it may be brought to your room. For patients admitted to the  hospital, discharge time will be determined by your treatment team. Patients discharged the day of surgery will not be allowed to drive home.  Special instructions: Shower the night before surgery and the morning of surgery with CHG. Please read over the following fact sheets that you were given. Pain Booklet, Coughing and Deep Breathing, Blood Transfusion Information, MRSA Information and Surgical Site Infection Prevention

## 2017-02-05 NOTE — Progress Notes (Addendum)
Anesthesia Chart Review:  Pt is a 72 year old female scheduled for R VATS/wedge resection, possible thoracotomy major, possible R lobectomy on 02/08/2017 with Gilford Raid, MD  - PCP is [name], MD - Pulmonologist is Teressa Lower, MD - Saw cardiologist is Rozann Lesches, MD for tachycardia, leg swelling 05/13/16.  Holter monitor ordered, results below. F/u with PCP recommended.   PMH includes:  COPD, asthma, von willebrand disease, hyperlipidemia, GERD. Former smoker. BMI 30.5  Medications include: albuterol, ASA 81mg , pulmicort, advair, duoneb, protonix, pravastatin, zantac  Preoperative labs reviewed.    CXR will be obtained day of surgery  EKG 02/04/17: NSR  PFTs 01/26/17:  -FEV1 1.85 L /67%  -FVC 2.6 L /71%  -total lung capacity 5.91 L,/100%.   Holter monitor 05/22/16:  - Sinus rhythm present throughout. Heart rate ranged from 67 bpm up to 124 bpm with average heart rate 85 bpm. There were rare PACs and PVCs noted. Brief burst of SVT, 5-6 beats. No sustained arrhythmias or pauses.  Echo 04/13/16:  1.  Normal LV chamber size.  Mild concentric LVH.  EF 60-65%. 2.  Grade 1 diastolic dysfunction. 3.  Mildly dilated LA 4.  Mild mitral and tricuspid regurgitation. 5.  All remaining valves without significant stenosis or insufficiency. 6.  Grossly normal aorta  In communication with Ryan in Dr. Vivi Martens office, he is aware of von willebrand's disease and will treat with platelets if needed.   If no changes, I anticipate pt can proceed with surgery as scheduled.   Willeen Cass, FNP-BC [facility]/Anesthesiology Phone: [phone] 02/05/2017 11:12 AM

## 2017-02-07 NOTE — Anesthesia Preprocedure Evaluation (Addendum)
Anesthesia Evaluation  Patient identified by MRN, date of birth, ID band Patient awake    Reviewed: Allergy & Precautions, NPO status , Patient's Chart, lab work & pertinent test results  Airway Mallampati: II  TM Distance: >3 FB Neck ROM: Full    Dental  (+) Teeth Intact   Pulmonary shortness of breath and with exertion, asthma , COPD (chronic cough, sinus drainage.),  COPD inhaler, former smoker,    breath sounds clear to auscultation       Cardiovascular negative cardio ROS   Rhythm:Regular Rate:Normal  ECG: NSR, rate 82  PCP is [name], MD Pulmonologist is Teressa Lower, MD Saw cardiologist is Rozann Lesches, MD for tachycardia, leg swelling 05/13/16.  Holter monitor ordered, results below. F/u with PCP recommended.   ECHO: Echo 04/13/16:  1.  Normal LV chamber size.  Mild concentric LVH.  EF 60-65%. 2.  Grade 1 diastolic dysfunction. 3.  Mildly dilated LA 4.  Mild mitral and tricuspid regurgitation. 5.  All remaining valves without significant stenosis or insufficiency. 6.  Grossly normal aorta      Neuro/Psych PSYCHIATRIC DISORDERS Anxiety Depression    GI/Hepatic Neg liver ROS, GERD  Medicated and Controlled,  Endo/Other  negative endocrine ROS  Renal/GU negative Renal ROS     Musculoskeletal  (+) Arthritis ,   Abdominal   Peds  Hematology  (+) Blood dyscrasia, , HLD Von Willebrand disease    Anesthesia Other Findings RLL LUNG NODULE  Reproductive/Obstetrics                            Anesthesia Physical  Anesthesia Plan  ASA: III  Anesthesia Plan: General   Post-op Pain Management:    Induction: Intravenous  PONV Risk Score and Plan: 3 and Midazolam, Dexamethasone, Ondansetron and Treatment may vary due to age or medical condition  Airway Management Planned: Double Lumen EBT  Additional Equipment: Arterial line, CVP and Ultrasound Guidance Line  Placement  Intra-op Plan:   Post-operative Plan: Possible Post-op intubation/ventilation  Informed Consent: I have reviewed the patients History and Physical, chart, labs and discussed the procedure including the risks, benefits and alternatives for the proposed anesthesia with the patient or authorized representative who has indicated his/her understanding and acceptance.   Dental advisory given  Plan Discussed with: CRNA  Anesthesia Plan Comments:         Anesthesia Quick Evaluation

## 2017-02-08 ENCOUNTER — Inpatient Hospital Stay (HOSPITAL_COMMUNITY)
Admission: RE | Admit: 2017-02-08 | Discharge: 2017-02-11 | DRG: 164 | Disposition: A | Discharge status: Home / Self Care | Payer: Medicare Other | Source: Ambulatory Visit | Attending: Surgery | Admitting: Surgery

## 2017-02-08 ENCOUNTER — Inpatient Hospital Stay (HOSPITAL_COMMUNITY): Payer: Medicare Other

## 2017-02-08 ENCOUNTER — Inpatient Hospital Stay (HOSPITAL_COMMUNITY): Payer: Medicare Other | Admitting: Anesthesiology

## 2017-02-08 ENCOUNTER — Encounter (HOSPITAL_COMMUNITY)
Admission: RE | Disposition: A | Discharge status: Home / Self Care | Payer: Self-pay | Source: Ambulatory Visit | Attending: Surgery

## 2017-02-08 ENCOUNTER — Encounter (HOSPITAL_COMMUNITY): Payer: Self-pay | Admitting: *Deleted

## 2017-02-08 ENCOUNTER — Other Ambulatory Visit: Payer: Self-pay

## 2017-02-08 DIAGNOSIS — I700 Atherosclerosis of aorta: Secondary | ICD-10-CM | POA: Diagnosis present

## 2017-02-08 DIAGNOSIS — F329 Major depressive disorder, single episode, unspecified: Secondary | ICD-10-CM | POA: Diagnosis present

## 2017-02-08 DIAGNOSIS — E785 Hyperlipidemia, unspecified: Secondary | ICD-10-CM | POA: Diagnosis present

## 2017-02-08 DIAGNOSIS — Z885 Allergy status to narcotic agent status: Secondary | ICD-10-CM | POA: Diagnosis not present

## 2017-02-08 DIAGNOSIS — D62 Acute posthemorrhagic anemia: Secondary | ICD-10-CM | POA: Diagnosis not present

## 2017-02-08 DIAGNOSIS — Z79899 Other long term (current) drug therapy: Secondary | ICD-10-CM | POA: Diagnosis not present

## 2017-02-08 DIAGNOSIS — Z87891 Personal history of nicotine dependence: Secondary | ICD-10-CM

## 2017-02-08 DIAGNOSIS — J438 Other emphysema: Secondary | ICD-10-CM | POA: Diagnosis present

## 2017-02-08 DIAGNOSIS — Z888 Allergy status to other drugs, medicaments and biological substances status: Secondary | ICD-10-CM | POA: Diagnosis not present

## 2017-02-08 DIAGNOSIS — Z9889 Other specified postprocedural states: Secondary | ICD-10-CM

## 2017-02-08 DIAGNOSIS — Z7982 Long term (current) use of aspirin: Secondary | ICD-10-CM

## 2017-02-08 DIAGNOSIS — K219 Gastro-esophageal reflux disease without esophagitis: Secondary | ICD-10-CM | POA: Diagnosis present

## 2017-02-08 DIAGNOSIS — R911 Solitary pulmonary nodule: Secondary | ICD-10-CM | POA: Diagnosis not present

## 2017-02-08 DIAGNOSIS — D680 Von Willebrand's disease: Secondary | ICD-10-CM | POA: Diagnosis present

## 2017-02-08 DIAGNOSIS — R110 Nausea: Secondary | ICD-10-CM | POA: Diagnosis not present

## 2017-02-08 DIAGNOSIS — C3431 Malignant neoplasm of lower lobe, right bronchus or lung: Principal | ICD-10-CM | POA: Diagnosis present

## 2017-02-08 DIAGNOSIS — M1990 Unspecified osteoarthritis, unspecified site: Secondary | ICD-10-CM | POA: Diagnosis present

## 2017-02-08 DIAGNOSIS — Z6830 Body mass index (BMI) 30.0-30.9, adult: Secondary | ICD-10-CM

## 2017-02-08 DIAGNOSIS — C3430 Malignant neoplasm of lower lobe, unspecified bronchus or lung: Secondary | ICD-10-CM | POA: Diagnosis present

## 2017-02-08 HISTORY — PX: THORACOTOMY: SHX5074

## 2017-02-08 HISTORY — PX: VIDEO ASSISTED THORACOSCOPY (VATS)/WEDGE RESECTION: SHX6174

## 2017-02-08 LAB — GLUCOSE, CAPILLARY
GLUCOSE-CAPILLARY: 155 mg/dL — AB (ref 65–99)
GLUCOSE-CAPILLARY: 155 mg/dL — AB (ref 65–99)
Glucose-Capillary: 184 mg/dL — ABNORMAL HIGH (ref 65–99)

## 2017-02-08 LAB — POCT I-STAT 4, (NA,K, GLUC, HGB,HCT)
GLUCOSE: 193 mg/dL — AB (ref 65–99)
HEMATOCRIT: 38 % (ref 36.0–46.0)
HEMOGLOBIN: 12.9 g/dL (ref 12.0–15.0)
Potassium: 4.2 mmol/L (ref 3.5–5.1)
Sodium: 138 mmol/L (ref 135–145)

## 2017-02-08 LAB — POCT I-STAT 3, ART BLOOD GAS (G3+)
Bicarbonate: 26.3 mmol/L (ref 20.0–28.0)
O2 SAT: 95 %
PCO2 ART: 48.6 mmHg — AB (ref 32.0–48.0)
Patient temperature: 97.8
TCO2: 28 mmol/L (ref 22–32)
pH, Arterial: 7.338 — ABNORMAL LOW (ref 7.350–7.450)
pO2, Arterial: 81 mmHg — ABNORMAL LOW (ref 83.0–108.0)

## 2017-02-08 SURGERY — VIDEO ASSISTED THORACOSCOPY (VATS)/WEDGE RESECTION
Anesthesia: General | Site: Chest | Laterality: Right

## 2017-02-08 MED ORDER — EPHEDRINE 5 MG/ML INJ
INTRAVENOUS | Status: AC
  Filled 2017-02-08: qty 10

## 2017-02-08 MED ORDER — PHENYLEPHRINE HCL 10 MG/ML IJ SOLN
INTRAMUSCULAR | Status: DC | PRN
  Administered 2017-02-08: 20 ug/min via INTRAVENOUS

## 2017-02-08 MED ORDER — FENTANYL CITRATE (PF) 100 MCG/2ML IJ SOLN
INTRAMUSCULAR | Status: DC | PRN
  Administered 2017-02-08 (×4): 50 ug via INTRAVENOUS
  Administered 2017-02-08 (×2): 25 ug via INTRAVENOUS
  Administered 2017-02-08 (×2): 50 ug via INTRAVENOUS
  Administered 2017-02-08: 25 ug via INTRAVENOUS
  Administered 2017-02-08: 50 ug via INTRAVENOUS
  Administered 2017-02-08: 25 ug via INTRAVENOUS
  Administered 2017-02-08: 50 ug via INTRAVENOUS

## 2017-02-08 MED ORDER — KCL IN DEXTROSE-NACL 20-5-0.45 MEQ/L-%-% IV SOLN
INTRAVENOUS | Status: DC
  Administered 2017-02-08 – 2017-02-09 (×2): via INTRAVENOUS
  Filled 2017-02-08 (×2): qty 1000

## 2017-02-08 MED ORDER — CARBOXYMETHYLCELLULOSE SODIUM 0.5 % OP SOLN: 1.0000 [drp] | Freq: Two times a day (BID) | OPHTHALMIC | Status: DC | PRN

## 2017-02-08 MED ORDER — PROPOFOL 10 MG/ML IV BOLUS
INTRAVENOUS | Status: AC
  Filled 2017-02-08: qty 20

## 2017-02-08 MED ORDER — SUCCINYLCHOLINE CHLORIDE 200 MG/10ML IV SOSY
PREFILLED_SYRINGE | INTRAVENOUS | Status: AC
  Filled 2017-02-08: qty 10

## 2017-02-08 MED ORDER — FENTANYL CITRATE (PF) 250 MCG/5ML IJ SOLN
INTRAMUSCULAR | Status: AC
  Filled 2017-02-08: qty 5

## 2017-02-08 MED ORDER — SUGAMMADEX SODIUM 200 MG/2ML IV SOLN
INTRAVENOUS | Status: DC | PRN
  Administered 2017-02-08: 200 mg via INTRAVENOUS

## 2017-02-08 MED ORDER — DEXTROSE 5 % IV SOLN
1.5000 g | INTRAVENOUS | Status: AC
  Administered 2017-02-08: 1.5 g via INTRAVENOUS
  Filled 2017-02-08: qty 1.5

## 2017-02-08 MED ORDER — METOCLOPRAMIDE HCL 5 MG/ML IJ SOLN
10.0000 mg | Freq: Four times a day (QID) | INTRAMUSCULAR | Status: AC
  Administered 2017-02-08 – 2017-02-09 (×4): 10 mg via INTRAVENOUS
  Filled 2017-02-08 (×4): qty 2

## 2017-02-08 MED ORDER — DARIFENACIN HYDROBROMIDE ER 7.5 MG PO TB24
7.5000 mg | ORAL_TABLET | Freq: Every day | ORAL | Status: DC
  Administered 2017-02-08 – 2017-02-10 (×2): 7.5 mg via ORAL
  Filled 2017-02-08 (×4): qty 1

## 2017-02-08 MED ORDER — FENTANYL CITRATE (PF) 100 MCG/2ML IJ SOLN
25.0000 ug | INTRAMUSCULAR | Status: DC | PRN
  Administered 2017-02-08 (×2): 50 ug via INTRAVENOUS

## 2017-02-08 MED ORDER — LORATADINE 10 MG PO TABS
10.0000 mg | ORAL_TABLET | Freq: Every day | ORAL | Status: DC
  Administered 2017-02-09 – 2017-02-11 (×3): 10 mg via ORAL
  Filled 2017-02-08 (×3): qty 1

## 2017-02-08 MED ORDER — ONDANSETRON HCL 4 MG/2ML IJ SOLN
INTRAMUSCULAR | Status: AC
  Filled 2017-02-08: qty 2

## 2017-02-08 MED ORDER — LEVALBUTEROL HCL 0.63 MG/3ML IN NEBU
0.6300 mg | INHALATION_SOLUTION | Freq: Four times a day (QID) | RESPIRATORY_TRACT | Status: DC
  Administered 2017-02-08: 0.63 mg via RESPIRATORY_TRACT
  Filled 2017-02-08: qty 3

## 2017-02-08 MED ORDER — LACTATED RINGERS IV SOLN
INTRAVENOUS | Status: DC | PRN
  Administered 2017-02-08: 07:00:00 via INTRAVENOUS

## 2017-02-08 MED ORDER — PHENYLEPHRINE HCL 10 MG/ML IJ SOLN
INTRAMUSCULAR | Status: DC | PRN
  Administered 2017-02-08: 40 ug via INTRAVENOUS

## 2017-02-08 MED ORDER — MOMETASONE FURO-FORMOTEROL FUM 200-5 MCG/ACT IN AERO
2.0000 | INHALATION_SPRAY | Freq: Two times a day (BID) | RESPIRATORY_TRACT | Status: DC
  Administered 2017-02-08 – 2017-02-11 (×6): 2 via RESPIRATORY_TRACT
  Filled 2017-02-08: qty 8.8

## 2017-02-08 MED ORDER — ONDANSETRON HCL 4 MG/2ML IJ SOLN: 4.0000 mg | Freq: Once | INTRAMUSCULAR | Status: DC | PRN

## 2017-02-08 MED ORDER — POTASSIUM CHLORIDE 10 MEQ/50ML IV SOLN
10.0000 meq | Freq: Every day | INTRAVENOUS | Status: DC | PRN
  Filled 2017-02-08: qty 50

## 2017-02-08 MED ORDER — FENTANYL CITRATE (PF) 100 MCG/2ML IJ SOLN
INTRAMUSCULAR | Status: AC
  Filled 2017-02-08: qty 2

## 2017-02-08 MED ORDER — BUPROPION HCL ER (SR) 100 MG PO TB12
200.0000 mg | ORAL_TABLET | Freq: Two times a day (BID) | ORAL | Status: DC
  Administered 2017-02-08 – 2017-02-11 (×6): 200 mg via ORAL
  Filled 2017-02-08 (×6): qty 2

## 2017-02-08 MED ORDER — DEXAMETHASONE SODIUM PHOSPHATE 10 MG/ML IJ SOLN
INTRAMUSCULAR | Status: AC
  Filled 2017-02-08: qty 1

## 2017-02-08 MED ORDER — NALOXONE HCL 0.4 MG/ML IJ SOLN: 0.4000 mg | INTRAMUSCULAR | Status: DC | PRN

## 2017-02-08 MED ORDER — ROCURONIUM BROMIDE 10 MG/ML (PF) SYRINGE
PREFILLED_SYRINGE | INTRAVENOUS | Status: AC
  Filled 2017-02-08: qty 5

## 2017-02-08 MED ORDER — MONTELUKAST SODIUM 10 MG PO TABS
10.0000 mg | ORAL_TABLET | Freq: Every day | ORAL | Status: DC
  Administered 2017-02-09 – 2017-02-11 (×3): 10 mg via ORAL
  Filled 2017-02-08 (×3): qty 1

## 2017-02-08 MED ORDER — SODIUM CHLORIDE 0.9% FLUSH: 9.0000 mL | INTRAVENOUS | Status: DC | PRN

## 2017-02-08 MED ORDER — BISACODYL 5 MG PO TBEC
10.0000 mg | DELAYED_RELEASE_TABLET | Freq: Every day | ORAL | Status: DC
  Administered 2017-02-09 – 2017-02-10 (×2): 10 mg via ORAL
  Filled 2017-02-08 (×3): qty 2

## 2017-02-08 MED ORDER — ORAL CARE MOUTH RINSE
15.0000 mL | Freq: Two times a day (BID) | OROMUCOSAL | Status: DC
  Administered 2017-02-08 – 2017-02-10 (×5): 15 mL via OROMUCOSAL

## 2017-02-08 MED ORDER — TRAMADOL HCL 50 MG PO TABS: 50.0000 mg | ORAL_TABLET | Freq: Four times a day (QID) | ORAL | Status: DC | PRN

## 2017-02-08 MED ORDER — AZELASTINE HCL 0.1 % NA SOLN
2.0000 | Freq: Every day | NASAL | Status: DC | PRN
  Filled 2017-02-08: qty 30

## 2017-02-08 MED ORDER — POLYVINYL ALCOHOL 1.4 % OP SOLN
1.0000 [drp] | OPHTHALMIC | Status: DC | PRN
  Administered 2017-02-08 – 2017-02-09 (×3): 1 [drp] via OPHTHALMIC
  Filled 2017-02-08 (×2): qty 15

## 2017-02-08 MED ORDER — OXYCODONE HCL 5 MG PO TABS: 5.0000 mg | ORAL_TABLET | ORAL | Status: DC | PRN

## 2017-02-08 MED ORDER — ACETAMINOPHEN 500 MG PO TABS
1000.0000 mg | ORAL_TABLET | Freq: Four times a day (QID) | ORAL | Status: DC
  Administered 2017-02-08 – 2017-02-11 (×13): 1000 mg via ORAL
  Filled 2017-02-08 (×13): qty 2

## 2017-02-08 MED ORDER — DIPHENHYDRAMINE HCL 12.5 MG/5ML PO ELIX: 12.5000 mg | ORAL_SOLUTION | Freq: Four times a day (QID) | ORAL | Status: DC | PRN

## 2017-02-08 MED ORDER — 0.9 % SODIUM CHLORIDE (POUR BTL) OPTIME
TOPICAL | Status: DC | PRN
  Administered 2017-02-08: 2000 mL

## 2017-02-08 MED ORDER — ONDANSETRON HCL 4 MG/2ML IJ SOLN: 4.0000 mg | Freq: Four times a day (QID) | INTRAMUSCULAR | Status: DC | PRN

## 2017-02-08 MED ORDER — PANTOPRAZOLE SODIUM 40 MG PO TBEC
40.0000 mg | DELAYED_RELEASE_TABLET | Freq: Every day | ORAL | Status: DC
  Administered 2017-02-08 – 2017-02-09 (×2): 40 mg via ORAL
  Filled 2017-02-08 (×2): qty 1

## 2017-02-08 MED ORDER — PHENYLEPHRINE 40 MCG/ML (10ML) SYRINGE FOR IV PUSH (FOR BLOOD PRESSURE SUPPORT)
PREFILLED_SYRINGE | INTRAVENOUS | Status: AC
  Filled 2017-02-08: qty 10

## 2017-02-08 MED ORDER — ACETAMINOPHEN 160 MG/5ML PO SOLN: 1000.0000 mg | Freq: Four times a day (QID) | ORAL | Status: DC

## 2017-02-08 MED ORDER — ONDANSETRON HCL 4 MG/2ML IJ SOLN
4.0000 mg | Freq: Four times a day (QID) | INTRAMUSCULAR | Status: DC | PRN
  Administered 2017-02-08 – 2017-02-10 (×3): 4 mg via INTRAVENOUS
  Filled 2017-02-08 (×3): qty 2

## 2017-02-08 MED ORDER — PREDNISOLONE ACETATE 1 % OP SUSP
1.0000 [drp] | Freq: Two times a day (BID) | OPHTHALMIC | Status: DC
  Administered 2017-02-08 – 2017-02-10 (×2): 1 [drp] via OPHTHALMIC
  Filled 2017-02-08: qty 1

## 2017-02-08 MED ORDER — HYDROMORPHONE 1 MG/ML IV SOLN
INTRAVENOUS | Status: DC
  Administered 2017-02-08: 0.3 mg via INTRAVENOUS
  Administered 2017-02-08: 11:00:00 via INTRAVENOUS
  Administered 2017-02-08: 0 mg via INTRAVENOUS
  Administered 2017-02-08: 2.4 mg via INTRAVENOUS
  Administered 2017-02-09: 0.9 mg via INTRAVENOUS
  Administered 2017-02-09: 1.5 mg via INTRAVENOUS
  Administered 2017-02-09: 0.6 mg via INTRAVENOUS
  Administered 2017-02-09: 1.5 mg via INTRAVENOUS

## 2017-02-08 MED ORDER — SENNOSIDES-DOCUSATE SODIUM 8.6-50 MG PO TABS
1.0000 | ORAL_TABLET | Freq: Every day | ORAL | Status: DC
  Administered 2017-02-08 – 2017-02-09 (×2): 1 via ORAL
  Filled 2017-02-08 (×3): qty 1

## 2017-02-08 MED ORDER — LIDOCAINE 2% (20 MG/ML) 5 ML SYRINGE
INTRAMUSCULAR | Status: AC
  Filled 2017-02-08: qty 5

## 2017-02-08 MED ORDER — LEVALBUTEROL HCL 0.63 MG/3ML IN NEBU
0.6300 mg | INHALATION_SOLUTION | Freq: Two times a day (BID) | RESPIRATORY_TRACT | Status: DC
  Administered 2017-02-08 – 2017-02-11 (×6): 0.63 mg via RESPIRATORY_TRACT
  Filled 2017-02-08 (×6): qty 3

## 2017-02-08 MED ORDER — MIDAZOLAM HCL 5 MG/5ML IJ SOLN
INTRAMUSCULAR | Status: DC | PRN
  Administered 2017-02-08 (×2): 1 mg via INTRAVENOUS

## 2017-02-08 MED ORDER — MIDAZOLAM HCL 2 MG/2ML IJ SOLN
INTRAMUSCULAR | Status: AC
  Filled 2017-02-08: qty 2

## 2017-02-08 MED ORDER — FLUTICASONE PROPIONATE 50 MCG/ACT NA SUSP
2.0000 | Freq: Every day | NASAL | Status: DC | PRN
  Filled 2017-02-08: qty 16

## 2017-02-08 MED ORDER — ROCURONIUM BROMIDE 100 MG/10ML IV SOLN
INTRAVENOUS | Status: DC | PRN
  Administered 2017-02-08 (×2): 20 mg via INTRAVENOUS
  Administered 2017-02-08: 5 mg via INTRAVENOUS
  Administered 2017-02-08: 50 mg via INTRAVENOUS

## 2017-02-08 MED ORDER — ONDANSETRON HCL 4 MG/2ML IJ SOLN
INTRAMUSCULAR | Status: DC | PRN
  Administered 2017-02-08: 4 mg via INTRAVENOUS

## 2017-02-08 MED ORDER — SUGAMMADEX SODIUM 200 MG/2ML IV SOLN
INTRAVENOUS | Status: AC
  Filled 2017-02-08: qty 2

## 2017-02-08 MED ORDER — PROPOFOL 10 MG/ML IV BOLUS
INTRAVENOUS | Status: DC | PRN
  Administered 2017-02-08 (×3): 50 mg via INTRAVENOUS
  Administered 2017-02-08: 150 mg via INTRAVENOUS

## 2017-02-08 MED ORDER — BUDESONIDE 0.5 MG/2ML IN SUSP: 0.5000 mg | Freq: Two times a day (BID) | RESPIRATORY_TRACT | Status: DC | PRN

## 2017-02-08 MED ORDER — HYDROMORPHONE 1 MG/ML IV SOLN
INTRAVENOUS | Status: AC
  Filled 2017-02-08: qty 25

## 2017-02-08 MED ORDER — BUPIVACAINE HCL (PF) 0.5 % IJ SOLN
INTRAMUSCULAR | Status: AC
  Filled 2017-02-08: qty 10

## 2017-02-08 MED ORDER — INSULIN ASPART 100 UNIT/ML ~~LOC~~ SOLN
0.0000 [IU] | SUBCUTANEOUS | Status: DC
  Administered 2017-02-08: 2 [IU] via SUBCUTANEOUS
  Administered 2017-02-08: 4 [IU] via SUBCUTANEOUS
  Administered 2017-02-08 – 2017-02-09 (×2): 2 [IU] via SUBCUTANEOUS

## 2017-02-08 MED ORDER — DEXAMETHASONE SODIUM PHOSPHATE 10 MG/ML IJ SOLN
INTRAMUSCULAR | Status: DC | PRN
  Administered 2017-02-08: 10 mg via INTRAVENOUS

## 2017-02-08 MED ORDER — DIPHENHYDRAMINE HCL 50 MG/ML IJ SOLN: 12.5000 mg | Freq: Four times a day (QID) | INTRAMUSCULAR | Status: DC | PRN

## 2017-02-08 MED ORDER — DEXTROSE 5 % IV SOLN
1.5000 g | Freq: Two times a day (BID) | INTRAVENOUS | Status: AC
  Administered 2017-02-08 – 2017-02-09 (×2): 1.5 g via INTRAVENOUS
  Filled 2017-02-08 (×2): qty 1.5

## 2017-02-08 MED ORDER — PRAVASTATIN SODIUM 20 MG PO TABS
20.0000 mg | ORAL_TABLET | Freq: Every day | ORAL | Status: DC
Start: 2017-02-09 — End: 2017-02-11
  Administered 2017-02-10: 20 mg via ORAL
  Filled 2017-02-08: qty 1

## 2017-02-08 MED ORDER — LEVALBUTEROL HCL 0.63 MG/3ML IN NEBU
0.6300 mg | INHALATION_SOLUTION | Freq: Four times a day (QID) | RESPIRATORY_TRACT | Status: DC | PRN
  Administered 2017-02-08 – 2017-02-10 (×3): 0.63 mg via RESPIRATORY_TRACT
  Filled 2017-02-08 (×3): qty 3

## 2017-02-08 SURGICAL SUPPLY — 55 items
APPLICATOR TIP COSEAL (VASCULAR PRODUCTS) ×8 IMPLANT
APPLICATOR TIP EXT COSEAL (VASCULAR PRODUCTS) ×4 IMPLANT
CANISTER SUCT 3000ML PPV (MISCELLANEOUS) ×4 IMPLANT
CLIP VESOCCLUDE MED 6/CT (CLIP) ×4 IMPLANT
CONT SPEC 4OZ CLIKSEAL STRL BL (MISCELLANEOUS) ×8 IMPLANT
COVER MAYO STAND STRL (DRAPES) ×4 IMPLANT
COVER SURGICAL LIGHT HANDLE (MISCELLANEOUS) ×4 IMPLANT
DERMABOND ADVANCED (GAUZE/BANDAGES/DRESSINGS) ×2
DERMABOND ADVANCED .7 DNX12 (GAUZE/BANDAGES/DRESSINGS) ×2 IMPLANT
DRAPE LAPAROSCOPIC ABDOMINAL (DRAPES) ×4 IMPLANT
ELECT BLADE 4.0 EZ CLEAN MEGAD (MISCELLANEOUS) ×4
ELECT BLADE 6.5 EXT (BLADE) ×4 IMPLANT
ELECT REM PT RETURN 9FT ADLT (ELECTROSURGICAL) ×4
ELECTRODE BLDE 4.0 EZ CLN MEGD (MISCELLANEOUS) ×2 IMPLANT
ELECTRODE REM PT RTRN 9FT ADLT (ELECTROSURGICAL) ×2 IMPLANT
FELT TEFLON 1X6 (MISCELLANEOUS) ×4 IMPLANT
GAUZE SPONGE 4X4 12PLY STRL (GAUZE/BANDAGES/DRESSINGS) ×4 IMPLANT
GLOVE BIOGEL PI IND STRL 6.5 (GLOVE) ×2 IMPLANT
GLOVE BIOGEL PI INDICATOR 6.5 (GLOVE) ×2
GLOVE EUDERMIC 7 POWDERFREE (GLOVE) ×8 IMPLANT
GOWN STRL REUS W/ TWL LRG LVL3 (GOWN DISPOSABLE) ×4 IMPLANT
GOWN STRL REUS W/ TWL XL LVL3 (GOWN DISPOSABLE) ×2 IMPLANT
GOWN STRL REUS W/TWL LRG LVL3 (GOWN DISPOSABLE) ×4
GOWN STRL REUS W/TWL XL LVL3 (GOWN DISPOSABLE) ×2
HANDLE STAPLE ENDO GIA SHORT (STAPLE) ×2
HANDLE UNIV ENDO GIA (ENDOMECHANICALS) ×4 IMPLANT
KIT BASIN OR (CUSTOM PROCEDURE TRAY) ×4 IMPLANT
KIT SUCTION CATH 14FR (SUCTIONS) ×4 IMPLANT
NS IRRIG 1000ML POUR BTL (IV SOLUTION) ×8 IMPLANT
PACK CHEST (CUSTOM PROCEDURE TRAY) ×4 IMPLANT
PAD ARMBOARD 7.5X6 YLW CONV (MISCELLANEOUS) ×8 IMPLANT
RELOAD EGIA 60 MED/THCK PURPLE (STAPLE) ×12 IMPLANT
RELOAD TRI 2.0 60 XTHK VAS SUL (STAPLE) ×4 IMPLANT
SEALANT SURG COSEAL 8ML (VASCULAR PRODUCTS) ×4 IMPLANT
SOLUTION ANTI FOG 6CC (MISCELLANEOUS) ×4 IMPLANT
STAPLER ENDO GIA 12MM SHORT (STAPLE) ×2 IMPLANT
SUT PROLENE 4 0 RB 1 (SUTURE) ×2
SUT PROLENE 4-0 RB1 .5 CRCL 36 (SUTURE) ×2 IMPLANT
SUT SILK  1 MH (SUTURE) ×4
SUT SILK 1 MH (SUTURE) ×4 IMPLANT
SUT SILK 1 TIES 10X30 (SUTURE) ×4 IMPLANT
SUT SILK 2 0SH CR/8 30 (SUTURE) ×4 IMPLANT
SUT VIC AB 1 CTX 36 (SUTURE) ×2
SUT VIC AB 1 CTX36XBRD ANBCTR (SUTURE) ×2 IMPLANT
SUT VIC AB 2-0 CT1 27 (SUTURE) ×2
SUT VIC AB 2-0 CT1 TAPERPNT 27 (SUTURE) ×2 IMPLANT
SUT VIC AB 2-0 CTX 36 (SUTURE) ×4 IMPLANT
SUT VIC AB 2-0 UR6 27 (SUTURE) ×4 IMPLANT
SUT VIC AB 3-0 X1 27 (SUTURE) ×4 IMPLANT
SUT VICRYL 2 TP 1 (SUTURE) ×4 IMPLANT
SYSTEM SAHARA CHEST DRAIN ATS (WOUND CARE) ×4 IMPLANT
TAPE CLOTH SURG 4X10 WHT LF (GAUZE/BANDAGES/DRESSINGS) ×4 IMPLANT
TRAY FOLEY W/METER SILVER 14FR (SET/KITS/TRAYS/PACK) ×4 IMPLANT
WATER STERILE IRR 1000ML POUR (IV SOLUTION) ×8 IMPLANT
YANKAUER SUCT BULB TIP NO VENT (SUCTIONS) ×4 IMPLANT

## 2017-02-08 NOTE — Anesthesia Procedure Notes (Signed)
Procedure Name: Intubation Date/Time: 02/08/2017 7:42 AM Performed by: Candis Shine, CRNA Pre-anesthesia Checklist: Patient identified, Emergency Drugs available, Suction available and Patient being monitored Patient Re-evaluated:Patient Re-evaluated prior to induction Oxygen Delivery Method: Circle System Utilized Preoxygenation: Pre-oxygenation with 100% oxygen Induction Type: IV induction Ventilation: Mask ventilation without difficulty Laryngoscope Size: Mac and 3 Grade View: Grade II Endobronchial tube: Double lumen EBT, Left, EBT position confirmed by fiberoptic bronchoscope and EBT position confirmed by auscultation and 37 Fr Number of attempts: 1 Airway Equipment and Method: Stylet Placement Confirmation: ETT inserted through vocal cords under direct vision,  positive ETCO2 and breath sounds checked- equal and bilateral Secured at: 29 cm Tube secured with: Tape Dental Injury: Teeth and Oropharynx as per pre-operative assessment

## 2017-02-08 NOTE — H&P (Signed)
[address]       [place] [address]             [phone]      Cardiothoracic Surgery Admission History and Physical   PCP is [name], MD  Referring Provider is Noralee Space, MD      Chief Complaint  Patient presents with  . 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 Manuella Ghazi who was evaluated by Dr. Lenna Gilford in 04/2014 for a prolonged productive cough with some wheezing and hoarseness. She had a CT in 01/2013 that showed scattered sub-centimeter lung nodules. She has been followed since by Dr. Lenna Gilford with medical treatment of her COPD and bronchitis. She had a CT of the chest at [facility] on 04/23/2016 that showed advanced bilateral upper lobe emphysema and a 10 x 18 mm density in the RLL. She had a PET scan on 05/13/2016 that did not show any hypermetabolic activity in the nodule and the decision was made to follow this. She has continued to have problems with cough and shortness of breath diagnosed as acute bronchitis and COPD exacerbation. She was seen by Dr. Domenic Polite in 05/2016 for leg edema and echo was normal. The edema improved with lasix. She had a follow up CT on 11/24/2016 which showed the RLL nodule to be larger at 2.4 x 1.1 x 1.2 cm. She had a PET scan done on 11/27/2016 that showed low level hypermetabolic activity in the nodule with an SUV of 2.4. There was another small 9 mm irregular opacity in the RUL near the surface of the lung that had low level uptake with an SUV max of 1.7. 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   Her PFTs show an FEV1 of 1.85 which is 67% of predicted.  There is no significant improvement with bronchodilators.  Her diffusion capacity is 48% of predicted.        Past Medical History:  Diagnosis Date  . Arthritis   . Asthma   . Blood dyscrasia   . COPD (chronic obstructive pulmonary  disease) ([place])   . Depression   . Dyspnea   . GERD (gastroesophageal reflux disease)   . Helicobacter pylori gastritis 2005  . Hyperlipidemia   . Von Willebrand disease ([place]) 1996        Past Surgical History:  Procedure Laterality Date  . BIOPSY  12/08/2016   Procedure: BIOPSY; Surgeon: Danie Binder, MD; Location: [facility]; Service: Endoscopy;;  . BRAVO [place] STUDY N/A 12/08/2016   Procedure: BRAVO [place]; Bravo Capsule Placement; Surgeon: Danie Binder, MD; Location: [facility]; Service: Endoscopy; Laterality: N/A;  . BREAST LUMPECTOMY    . CATARACT EXTRACTION     double  . COLONOSCOPY N/A 05/30/2012   Dr.Mann:Multiple polyps removed from the right colon-see description above; otherwise normal colonoscopy up to the cecum. 5 polyps, multiple tubular adenomas. next tcs 05/2015  . COLONOSCOPY N/A 12/13/2015   Procedure: COLONOSCOPY; Surgeon: Danie Binder, MD; Location: [facility]; Service: Endoscopy; Laterality: N/A; 8:30 am  . ESOPHAGOGASTRODUODENOSCOPY  2012   Dr. Collene Mares: few erosions in duodenal bulb. BRAVO off PPI ("severe reflux")  . ESOPHAGOGASTRODUODENOSCOPY N/A 06/12/2014   SLF: 1. The mucosa of the esophagus appeared normal 2. Mild non-erosive gastrtitis.   [name]  ESOPHAGOGASTRODUODENOSCOPY (EGD) WITH PROPOFOL N/A 12/08/2016   Procedure: ESOPHAGOGASTRODUODENOSCOPY (EGD) WITH PROPOFOL; Surgeon: Danie Binder, MD; Location[facility]; Service: Endoscopy; Laterality: N/A; 7:30am  . FLEXIBLE SIGMOIDOSCOPY N/A 02/05/2014   Dr. Oneida Alar: 5, 2-3 mm sessile sigmoid colon polyps removed, small internal hemorrhoids, moderate external hemorrhoids, 2 anal fissures present. Polyps were hyperplastic.   [name] NECK SURGERY  1951   cyst removal  . NECK SURGERY  1998   growth removed  . TONSILLECTOMY  1951  . TUBAL LIGATION  1984        Family History  Problem Relation Age of Onset  . Colon cancer Mother    Less than age 32  . Colon cancer Father    More than age 59, died from  metastatic disease at age 42  . Liver disease Neg Hx    Social History  Social History        Tobacco Use  . Smoking status: Former Smoker    Packs/day: 1.00    Years: 50.00    Pack years: 50.00    Types: Cigarettes    Start date: 03/17/1961    Last attempt to quit: 05/30/2009    Years since quitting: 7.6  . Smokeless tobacco: Never Used  . Tobacco comment: Quit smoking x 7 years  Substance Use Topics  . Alcohol use: Yes    Comment: Occasionally   . Drug use: No         Current Outpatient Medications  Medication Sig Dispense Refill  . acetaminophen (TYLENOL) 500 MG tablet Take 1,000 mg by mouth 2 (two) times daily as needed for moderate pain or headache.    [name] alum & mag hydroxide-simeth (MAALOX/MYLANTA) 200-200-20 MG/5ML suspension Take 15 mLs by mouth as needed for indigestion or heartburn.    . Ascorbic Acid (VITAMIN C) 1000 MG tablet Take 1,000 mg by mouth daily.    [name] aspirin 81 MG tablet Take 81 mg by mouth daily.    [name] azelastine (ASTELIN) 0.1 % nasal spray Place 2 sprays into both nostrils daily as needed for rhinitis. Use in each nostril as directed    . b complex vitamins tablet Take 1 tablet by mouth daily.    [name] bismuth-metronidazole-tetracycline (PYLERA) 140-125-125 MG capsule 3 PO QID FOR 10 DAYS (Patient taking differently: 2-3 PO QID FOR 10 DAYS) 120 capsule 0  . budesonide (PULMICORT) 0.5 MG/2ML nebulizer solution Takes as needed    . buPROPion (WELLBUTRIN SR) 200 MG 12 hr tablet Take 200 mg by mouth 2 (two) times daily.    . Ca Carbonate-Mag Hydroxide (ROLAIDS PO) Take 2 tablets by mouth as needed.    [name] CALCIUM-MAGNESIUM PO Take 1 tablet by mouth every evening.    . celecoxib (CELEBREX) 200 MG capsule Take 200 mg by mouth daily.     . cetirizine (ZYRTEC) 10 MG tablet Take 10 mg by mouth daily.    [name] docusate sodium (COLACE) 100 MG capsule Take 100 mg by mouth 2 (two) times daily.     [name] doxylamine, Sleep, (UNISOM) 25 MG tablet Take 12.5 mg by mouth at bedtime.    .  fluticasone (FLONASE) 50 MCG/ACT nasal spray Place 2 sprays into both nostrils daily as needed for allergies or rhinitis.    . Fluticasone-Salmeterol (ADVAIR) 500-50 MCG/DOSE AEPB Inhale 1 puff into the lungs 2 (two) times daily.     [name] HYDROcodone-homatropine (HYCODAN) 5-1.5 MG/5ML syrup Take 5 mLs by mouth every 6 (six) hours as needed for cough. [place]  mL 0  . ipratropium-albuterol (DUONEB) 0.5-2.5 (3) MG/3ML SOLN Take 3 mLs by nebulization 3 (three) times daily. 360 mL 11  . montelukast (SINGULAIR) 10 MG tablet Take 1 tablet by mouth daily.    [name] OVER THE COUNTER MEDICATION Take 1 tablet by mouth at bedtime. "midnight" dietary supplement    . pantoprazole (PROTONIX) 40 MG tablet Take 1 tablet (40 mg total) by mouth 2 (two) times daily. 30 minutes before breakfast and dinner 60 tablet 6  . pravastatin (PRAVACHOL) 20 MG tablet Take 20 mg by mouth at bedtime.     . ranitidine (ZANTAC) 150 MG tablet Take 150 mg at bedtime as needed by mouth for heartburn.    . solifenacin (VESICARE) 5 MG tablet Take 5 mg by mouth every evening.      No current facility-administered medications for this visit.         Allergies  Allergen Reactions  . Demerol [Meperidine] Nausea And Vomiting  . Betadine [Povidone Iodine] Rash    Topical only   Review of Systems   Constitutional: Negative for activity change, appetite change and fatigue.  HENT: Negative.  Eyes: Negative.  Respiratory: Positive for cough, shortness of breath and wheezing. Negative for chest tightness.  With exertion  Cardiovascular: Negative for chest pain, palpitations and leg swelling.  Gastrointestinal:  Reflux  Endocrine: Negative.  Genitourinary: Negative.  Musculoskeletal: Positive for arthralgias.  Skin: Negative.  Allergic/Immunologic: Negative.  Neurological:  Memory problems  Hematological: Negative.  Psychiatric/Behavioral:  Depression   BP (!) 149/90 (BP Location: Left Arm, Patient Position: Sitting, Cuff Size: Normal)   Pulse 85  Resp 18  Ht 5\' 9"  (1.753 m)  Wt 207 lb (93.9 kg)  SpO2 98% Comment: RA  BMI 30.57 kg/m   Physical Exam   Constitutional: She is oriented to person, place, and time. She appears well-developed and well-nourished. She appears distressed.  HENT:  Head: Normocephalic and atraumatic.  Mouth/Throat: Oropharynx is clear and moist.  Eyes: Conjunctivae and EOM are normal. Pupils are equal, round, and reactive to light.  Neck: Normal range of motion. Neck supple. No JVD present. No thyromegaly present.  Cardiovascular: Normal rate, regular rhythm, normal heart sounds and intact distal pulses.  No murmur heard.  Pulmonary/Chest: Effort normal and breath sounds normal. No respiratory distress. She has no wheezes. She has no rales. She exhibits no tenderness.  Abdominal: Soft. Bowel sounds are normal. She exhibits no distension and no mass. There is no tenderness.  Musculoskeletal: Normal range of motion. She exhibits no edema.  Lymphadenopathy:  She has no cervical adenopathy.  Neurological: She is alert and oriented to person, place, and time. She has normal strength. No cranial nerve deficit or sensory deficit.  Skin: Skin is warm and dry.  Psychiatric: She has a normal mood and affect.   Diagnostic Tests:   EXAM:  CT CHEST WITHOUT CONTRAST  TECHNIQUE:  Multidetector CT imaging of the chest was performed following the  standard protocol without IV contrast.  COMPARISON: PET-CT 05/14/2016. Chest CT 04/23/2016.  FINDINGS:  Cardiovascular: Heart size is normal. There is no significant  pericardial fluid, thickening or pericardial calcification. There is  aortic atherosclerosis, as well as atherosclerosis of the great  vessels of the mediastinum and the coronary arteries, including  calcified atherosclerotic plaque in the left main, left anterior  descending, left circumflex and right coronary arteries.  Mediastinum/Nodes: No pathologically enlarged mediastinal or hilar  lymph  nodes. Please note that accurate exclusion of hilar adenopathy  is limited on noncontrast CT scans. Esophagus is unremarkable in  appearance. No axillary lymphadenopathy.  Lungs/Pleura: Previously noted right lower lobe pulmonary nodule has  increased in size in appears more solid than the prior examination,  currently measuring 2.4 x 1.1 x 1.2 cm (axial image 97 of series 4,  and coronal image 124 of series 5), previously only 1.8 x 1.0 x 0.7  cm on 04/23/2016. This nodule has an aggressive appearance with  macrolobulated slightly spiculated margins and appears to contact  the overlying pleura posteriorly. No other definite suspicious  appearing pulmonary nodules or masses are noted. No acute  consolidative airspace disease. No pleural effusions. A few  scattered areas of mild cylindrical bronchiectasis, peripheral  bronchiolectasis, thickening of the peribronchovascular interstitium  and peribronchovascular ground-glass attenuation and micronodularity  are noted, most evident in the inferior aspect of the left lower  lobe. Mild diffuse bronchial wall thickening with moderate  centrilobular and mild paraseptal emphysema.  Upper Abdomen: Aortic atherosclerosis.  Musculoskeletal: Old healed midsternal fracture an old healed  fracture of the anterolateral aspect of the left fifth rib are again  noted. There are no aggressive appearing lytic or blastic lesions  noted in the visualized portions of the skeleton.  IMPRESSION:  1. Interval enlargement of an aggressive appearing nodule in the  posteromedial aspect of the right lower lobe highly concerning for  neoplasm. Further evaluation with repeat PET-CT is recommended at  this time.  2. Diffuse bronchial wall thickening with moderate centrilobular and  mild paraseptal emphysema; imaging findings suggestive of underlying  COPD.  3. Aortic atherosclerosis, in addition to left main and 3 vessel  coronary artery disease. Please note that  although the presence of  coronary artery calcium documents the presence of coronary artery  disease, the severity of this disease and any potential stenosis  cannot be assessed on this non-gated CT examination. Assessment for  potential risk factor modification, dietary therapy or pharmacologic  therapy may be warranted, if clinically indicated.  4. Additional incidental findings, as above.  These results will be called to the ordering clinician or  representative by the Radiologist Assistant, and communication  documented in the PACS or zVision Dashboard.  Aortic Atherosclerosis (ICD10-I70.0) and Emphysema (ICD10-J43.9).  Electronically Signed  By: Vinnie Langton M.D.  On: 11/25/2016 09:38    CLINICAL DATA: Subsequent treatment strategy for pulmonary nodule.  EXAM:  NUCLEAR MEDICINE PET SKULL BASE TO THIGH  TECHNIQUE:  10 mCi F-18 FDG was injected intravenously. Full-ring PET imaging  was performed from the skull base to thigh after the radiotracer. CT  data was obtained and used for attenuation correction and anatomic  localization.  FASTING BLOOD GLUCOSE: Value: 96 mg/dl  COMPARISON: 05/14/2016  FINDINGS:  NECK: No hypermetabolic lymph nodes in the neck.  CHEST: 9 mm irregular opacity posterior right upper lobe (image 16  series 8) is not substantially changed in the interval and religious  represents architectural distortion, but has demonstrable FDG  accumulation with SUV max = 1.7.  Posterior right lower lobe lesion in question measures 2.2 cm on CT  imaging and demonstrates FDG uptake with SUV max = 2.4.  Emphysema noted bilaterally. There is biapical pleural-parenchymal  scarring. Coronary artery and thoracic aortic atherosclerosis.  ABDOMEN/PELVIS: No abnormal hypermetabolic activity within the  liver, pancreas, adrenal glands, or spleen. No hypermetabolic lymph  nodes in the abdomen or pelvis.  2 cm hypoattenuating lesion subcapsular medial segment left liver    shows no hypermetabolism. Probable tiny gallstone.  There is  abdominal aortic atherosclerosis without aneurysm.  SKELETON: No focal hypermetabolic activity to suggest skeletal  metastasis.  IMPRESSION:  1. FDG uptake in the posterior right lower lobe pulmonary nodule in  question is identified although relatively low level. Nevertheless  given progressing size and demonstrable FDG accumulation, neoplasm  is suspected.  2. Second tiny 9 mm parenchymal irregularity posterior right upper  lobe demonstrates FDG uptake. Although uptake is low, given the tiny  irregular nature of this lesion, close follow-up is recommended as  neoplasm cannot be excluded.  3. Aortic Atherosclerois (ICD10-170.0)  4. Emphysema. (ICD10-J43.9).  Electronically Signed  By: Misty Stanley M.D.  On: 11/27/2016 13:35   Impression:   She has a small peripheral right lower lobe squamous cell carcinoma and a tiny nodular density in the periphery of the right upper lobe that had trace hypermetabolic activity and is indeterminate.  Her pulmonary function testing shows moderate obstructive lung disease as well as a moderate reduction in her diffusion capacity.  She has some baseline shortness of breath with exertion but no chest discomfort.  Her right lower lobe lung lesion is probably amenable to wedge resection which would minimize the loss of functional lung tissue.  Her CT scan shows extensive emphysematous changes primarily in the upper lobes and I think that a right lower lobectomy would remove a significant amount of functional lung.  I think her PFTs are probably adequate to perform right lower lobectomy but she may end up with more shortness of breath and she has now and could be oxygen dependent.  I told her that I would try to perform a wedge resection if at all possible and would only perform a right lower lobectomy if the surgical margin was positive.  The right upper lobe peripheral density may be visible at the time  of surgery and if it is I would also a small wedge resection.  I discussed the operative procedure of right thoracoscopy or possibly thoracotomy for wedge resection or right lower lobectomy.  I discussed alternatives, benefits, and risks including but not limited to bleeding, blood transfusion, infection, prolonged air leak, bronchial stump complications, persistent space, respiratory failure, worsening chronic shortness of breath and oxygen dependence, and recurrent cancer.  She understands all this and agrees to proceed with surgery.   Plan:  Right VATS or thoracotomy for RLL lung wedge resection of lung cancer and possible right lower lobectomy.  Gaye Pollack, MD  [facility]  [phone]

## 2017-02-08 NOTE — Transfer of Care (Signed)
Immediate Anesthesia Transfer of Care Note  Patient: Ellen Bowers  Procedure(s) Performed: VIDEO ASSISTED THORACOSCOPY (VATS)/WEDGE RESECTION (Right Chest) THORACOTOMY MAJOR (Right Chest)  Patient Location: PACU  Anesthesia Type:General  Level of Consciousness: awake, alert  and oriented  Airway & Oxygen Therapy: Patient Spontanous Breathing and Patient connected to nasal cannula oxygen  Post-op Assessment: Report given to RN and Post -op Vital signs reviewed and stable  Post vital signs: Reviewed and stable  Last Vitals:  Vitals:   02/08/17 0722 02/08/17 0723  BP:    Pulse: 76 78  Resp:    Temp:    SpO2: 100% 100%    Last Pain:  Vitals:   02/08/17 0545  TempSrc: Oral      Patients Stated Pain Goal: 2 (09/62/83 6629)  Complications: No apparent anesthesia complications

## 2017-02-08 NOTE — Anesthesia Procedure Notes (Signed)
Central Venous Catheter Insertion Performed by: Murvin Natal, MD, anesthesiologist Start/End1/28/2019 7:10 AM, 02/08/2017 7:20 AM Patient location: Pre-op. Preanesthetic checklist: patient identified, IV checked, site marked, risks and benefits discussed, surgical consent, monitors and equipment checked, pre-op evaluation, timeout performed and anesthesia consent Position: Trendelenburg Lidocaine 1% used for infiltration and patient sedated Hand hygiene performed  and maximum sterile barriers used  Catheter size: 8 Fr Total catheter length 16. Central line was placed.Double lumen Procedure performed using ultrasound guided technique. Ultrasound Notes:image(s) printed for medical record Attempts: 1 Following insertion, dressing applied and line sutured. Post procedure assessment: blood return through all ports  Patient tolerated the procedure well with no immediate complications.

## 2017-02-08 NOTE — Progress Notes (Signed)
Patient ID: [name], female   DOB: [birth year], 72 y.o.   MRN: [id number]  TCTS Evening Rounds:   Hemodynamically stable  Awake and alert.  Nausea this pm.  Urine output good  CT output low  CBC    Component Value Date/Time   WBC 8.5 02/04/2017 1534   RBC 4.67 02/04/2017 1534   HGB 12.9 02/08/2017 1202   HCT 38.0 02/08/2017 1202   PLT 229 02/04/2017 1534   MCV 87.2 02/04/2017 1534   MCH 28.5 02/04/2017 1534   MCHC 32.7 02/04/2017 1534   RDW 15.1 02/04/2017 1534   LYMPHSABS 3.3 06/07/2014 1255   MONOABS 0.8 06/07/2014 1255   EOSABS 0.2 06/07/2014 1255   BASOSABS 0.0 06/07/2014 1255     BMET    Component Value Date/Time   NA 138 02/08/2017 1202   K 4.2 02/08/2017 1202   CL 101 02/04/2017 1534   CO2 19 (L) 02/04/2017 1534   GLUCOSE 193 (H) 02/08/2017 1202   BUN 14 02/04/2017 1534   CREATININE 0.87 02/04/2017 1534   CALCIUM 9.2 02/04/2017 1534   GFRNONAA >60 02/04/2017 1534   GFRAA >60 02/04/2017 1534     A/P:  Stable postop course. Continue current plans.

## 2017-02-08 NOTE — Anesthesia Postprocedure Evaluation (Signed)
Anesthesia Post Note  Patient: Ellen Bowers  Procedure(s) Performed: VIDEO ASSISTED THORACOSCOPY (VATS)/WEDGE RESECTION (Right Chest) THORACOTOMY MAJOR (Right Chest)     Patient location during evaluation: PACU Anesthesia Type: General Level of consciousness: awake and alert Pain management: pain level controlled Vital Signs Assessment: post-procedure vital signs reviewed and stable Respiratory status: spontaneous breathing, nonlabored ventilation, respiratory function stable and patient connected to nasal cannula oxygen Cardiovascular status: blood pressure returned to baseline and stable Postop Assessment: no apparent nausea or vomiting Anesthetic complications: no    Last Vitals:  Vitals:   02/08/17 1315 02/08/17 1320  BP: 133/77   Pulse: 84   Resp: 19   Temp:    SpO2: 97% 96%    Last Pain:  Vitals:   02/08/17 1200  TempSrc: Oral  PainSc: 8                  Candence Sease P Sallyanne Birkhead

## 2017-02-08 NOTE — Brief Op Note (Signed)
02/08/2017  10:15 AM  PATIENT:  Ellen Bowers  72 y.o. female  PRE-OPERATIVE DIAGNOSIS:  RLL LUNG NODULE  POST-OPERATIVE DIAGNOSIS:  RLL LUNG NODULE  PROCEDURE:  Procedure(s): VIDEO ASSISTED THORACOSCOPY (VATS)/WEDGE RESECTION (Right) THORACOTOMY MAJOR (Right)  SURGEON:  Surgeon(s) and Role:    * Shone Leventhal, Fernande Boyden, MD - Primary  PHYSICIAN ASSISTANT: Lars Pinks, PA-C    ANESTHESIA:   general  EBL:  25 mL   BLOOD ADMINISTERED:none  DRAINS: one Chest Tube(s) in the right pleural space   LOCAL MEDICATIONS USED:  NONE  SPECIMEN:  Source of Specimen:  wedge of RLL  Frozen section shows negative margin  DISPOSITION OF SPECIMEN:  PATHOLOGY  COUNTS:  YES  TOURNIQUET:  * No tourniquets in log *  DICTATION: .Note written in EPIC  PLAN OF CARE: Admit to inpatient   PATIENT DISPOSITION:  PACU - hemodynamically stable.   Delay start of Pharmacological VTE agent (>24hrs) due to surgical blood loss or risk of bleeding: yes

## 2017-02-08 NOTE — Anesthesia Procedure Notes (Signed)
Arterial Line Insertion Start/End1/28/2019 6:50 AM Performed by: Candis Shine, CRNA, CRNA  Patient location: Pre-op. Preanesthetic checklist: patient identified, IV checked, site marked, risks and benefits discussed, surgical consent, monitors and equipment checked, pre-op evaluation, timeout performed and anesthesia consent Lidocaine 1% used for infiltration Left, radial was placed Catheter size: 20 G Hand hygiene performed  and maximum sterile barriers used   Attempts: 1 Procedure performed without using ultrasound guided technique. Following insertion, dressing applied and Biopatch. Post procedure assessment: normal and unchanged  Patient tolerated the procedure well with no immediate complications.

## 2017-02-08 NOTE — Interval H&P Note (Signed)
History and Physical Interval Note:  02/08/2017 5:54 AM  Ellen Bowers  has presented today for surgery, with the diagnosis of RLL LUNG NODULE  The various methods of treatment have been discussed with the patient and family. After consideration of risks, benefits and other options for treatment, the patient has consented to  Procedure(s): VIDEO ASSISTED THORACOSCOPY (VATS)/WEDGE RESECTION (Right) possible THORACOTOMY MAJOR (Right) possible LOBECTOMY (Right) as a surgical intervention .  The patient's history has been reviewed, patient examined, no change in status, stable for surgery.  I have reviewed the patient's chart and labs.  Questions were answered to the patient's satisfaction.     Gaye Pollack

## 2017-02-09 ENCOUNTER — Encounter (HOSPITAL_COMMUNITY): Payer: Self-pay | Admitting: Surgery

## 2017-02-09 ENCOUNTER — Inpatient Hospital Stay (HOSPITAL_COMMUNITY): Payer: Medicare Other

## 2017-02-09 LAB — BASIC METABOLIC PANEL
ANION GAP: 10 (ref 5–15)
BUN: 15 mg/dL (ref 6–20)
CALCIUM: 9.2 mg/dL (ref 8.9–10.3)
CO2: 21 mmol/L — ABNORMAL LOW (ref 22–32)
Chloride: 103 mmol/L (ref 101–111)
Creatinine, Ser: 0.77 mg/dL (ref 0.44–1.00)
GLUCOSE: 131 mg/dL — AB (ref 65–99)
Potassium: 4.2 mmol/L (ref 3.5–5.1)
SODIUM: 134 mmol/L — AB (ref 135–145)

## 2017-02-09 LAB — BLOOD GAS, ARTERIAL
Acid-Base Excess: 0.2 mmol/L (ref 0.0–2.0)
Bicarbonate: 24.1 mmol/L (ref 20.0–28.0)
FIO2: 32
O2 SAT: 98.4 %
PATIENT TEMPERATURE: 98.6
pCO2 arterial: 37.6 mmHg (ref 32.0–48.0)
pH, Arterial: 7.423 (ref 7.350–7.450)
pO2, Arterial: 112 mmHg — ABNORMAL HIGH (ref 83.0–108.0)

## 2017-02-09 LAB — CBC
HCT: 36.4 % (ref 36.0–46.0)
HEMOGLOBIN: 12.1 g/dL (ref 12.0–15.0)
MCH: 29.2 pg (ref 26.0–34.0)
MCHC: 33.2 g/dL (ref 30.0–36.0)
MCV: 87.7 fL (ref 78.0–100.0)
PLATELETS: 240 10*3/uL (ref 150–400)
RBC: 4.15 MIL/uL (ref 3.87–5.11)
RDW: 15.2 % (ref 11.5–15.5)
WBC: 9.8 10*3/uL (ref 4.0–10.5)

## 2017-02-09 LAB — GLUCOSE, CAPILLARY
GLUCOSE-CAPILLARY: 115 mg/dL — AB (ref 65–99)
GLUCOSE-CAPILLARY: 142 mg/dL — AB (ref 65–99)
Glucose-Capillary: 141 mg/dL — ABNORMAL HIGH (ref 65–99)
Glucose-Capillary: 136 mg/dL — ABNORMAL HIGH (ref 65–99)
Glucose-Capillary: 147 mg/dL — ABNORMAL HIGH (ref 65–99)

## 2017-02-09 MED ORDER — METOCLOPRAMIDE HCL 5 MG/ML IJ SOLN
5.0000 mg | Freq: Three times a day (TID) | INTRAMUSCULAR | Status: DC
  Administered 2017-02-09 – 2017-02-10 (×2): 5 mg via INTRAVENOUS
  Filled 2017-02-09 (×2): qty 2

## 2017-02-09 NOTE — Care Management Note (Addendum)
Case Management Note  Patient Details  Name: Ellen Bowers MRN: [id number] Date of Birth: 1945-12-30  Subjective/Objective:   From home with spouse, pta indep, POD 1 VATS, wedge resection, thoracotomy, conts with chest tube will put to water seal.  Spouse will be with patient for assistance 24/7.  She has PCP and medication coverage.                  Action/Plan: NCM will follow for dc needs.   Expected Discharge Date:                  Expected Discharge Plan:     In-House Referral:     Discharge planning Services  CM Consult  Post Acute Care Choice:    Choice offered to:     DME Arranged:    DME Agency:     HH Arranged:    HH Agency:     Status of Service:  In process, will continue to follow  If discussed at Long Length of Stay Meetings, dates discussed:    Additional Comments:  Zenon Mayo, RN 02/09/2017, 10:50 AM

## 2017-02-09 NOTE — Progress Notes (Signed)
Patient ID: Ellen Bowers, female   DOB: 03-May-1945, 73 y.o.   MRN: [id number] EVENING ROUNDS NOTE :     [address]       [place], [address]             [phone]                 1 Day Post-Op Procedure(s) (LRB): VIDEO ASSISTED THORACOSCOPY (VATS)/WEDGE RESECTION (Right) THORACOTOMY MAJOR (Right)  Total Length of Stay:  LOS: 1 day  BP (!) 110/99   Pulse 94   Temp 98.4 F (36.9 C) (Oral)   Resp 18   Ht 5' 9.5" (1.765 m)   Wt 212 lb 4.9 oz (96.3 kg)   SpO2 97%   BMI 30.90 kg/m   .Intake/Output      01/28 0701 - 01/29 0700 01/29 0701 - 01/30 0700   I.V. (mL/kg) 2426.3 (25.2) 203.2 (2.1)   IV Piggyback 100    Total Intake(mL/kg) 2526.3 (26.2) 203.2 (2.1)   Urine (mL/kg/hr) 990 (0.4) 250 (0.2)   Blood 25    Chest Tube 174 20   Total Output 1189 270   Net +1337.3 -66.8          . dextrose 5 % and 0.45 % NaCl with KCl 20 mEq/L 10 mL/hr at 02/09/17 0826  . potassium chloride       Lab Results  Component Value Date   WBC 9.8 02/09/2017   HGB 12.1 02/09/2017   HCT 36.4 02/09/2017   PLT 240 02/09/2017   GLUCOSE 131 (H) 02/09/2017   ALT 19 02/04/2017   AST 20 02/04/2017   NA 134 (L) 02/09/2017   K 4.2 02/09/2017   CL 103 02/09/2017   CREATININE 0.77 02/09/2017   BUN 15 02/09/2017   CO2 21 (L) 02/09/2017   INR 0.95 02/04/2017   No air leak from single chest tube Ambulated some today but is not being able to go around the whole unit yet   Grace Isaac MD  [place] Office [phone] 02/09/2017 6:43 PM

## 2017-02-09 NOTE — Progress Notes (Signed)
1 Day Post-Op Procedure(s) (LRB): VIDEO ASSISTED THORACOSCOPY (VATS)/WEDGE RESECTION (Right) THORACOTOMY MAJOR (Right) Subjective: Sore but PCA is working  Nausea resolved  Objective: Vital signs in last 24 hours: Temp:  [97 F (36.1 C)-98.7 F (37.1 C)] 98.7 F (37.1 C) (01/29 0401) Pulse Rate:  [37-100] 88 (01/29 0700) Cardiac Rhythm: Normal sinus rhythm (01/29 0400) Resp:  [10-23] 18 (01/29 0700) BP: (107-154)/(56-85) 120/64 (01/29 0700) SpO2:  [64 %-100 %] 97 % (01/29 0700) Arterial Line BP: (136-188)/(67-90) 136/71 (01/29 0700) Weight:  [96.3 kg (212 lb 4.9 oz)] 96.3 kg (212 lb 4.9 oz) (01/28 1200)  Hemodynamic parameters for last 24 hours:    Intake/Output from previous day: 01/28 0701 - 01/29 0700 In: 2526.3 [I.V.:2426.3; IV Piggyback:100] Out: 1189 [Urine:990; Blood:25; Chest Tube:174] Intake/Output this shift: No intake/output data recorded.  General appearance: alert and cooperative Neurologic: intact Heart: regular rate and rhythm, S1, S2 normal, no murmur, click, rub or gallop Lungs: few rhonchi bilat Wound: dressing dry No air leak from chest tube  Lab Results: Recent Labs    02/08/17 1202 02/09/17 0413  WBC  --  9.8  HGB 12.9 12.1  HCT 38.0 36.4  PLT  --  240   BMET:  Recent Labs    02/08/17 1202 02/09/17 0413  NA 138 134*  K 4.2 4.2  CL  --  103  CO2  --  21*  GLUCOSE 193* 131*  BUN  --  15  CREATININE  --  0.77  CALCIUM  --  9.2    PT/INR: No results for input(s): LABPROT, INR in the last 72 hours. ABG    Component Value Date/Time   PHART 7.423 02/09/2017 0435   HCO3 24.1 02/09/2017 0435   TCO2 28 02/08/2017 1207   ACIDBASEDEF 0.1 02/04/2017 1534   O2SAT 98.4 02/09/2017 0435   CBG (last 3)  Recent Labs    02/08/17 2010 02/09/17 0005 02/09/17 0403  GLUCAP 155* 142* 115*    Assessment/Plan: S/P Procedure(s) (LRB): VIDEO ASSISTED THORACOSCOPY (VATS)/WEDGE RESECTION (Right) THORACOTOMY MAJOR (Right)  POD 1 She is  hemodynamically stable in sinus rhythm No air leak so will put chest tube to water seal. Check CXR today. IV to [facility] DC arterial line Ambulate, IS   LOS: 1 day    Gaye Pollack 02/09/2017

## 2017-02-10 ENCOUNTER — Inpatient Hospital Stay (HOSPITAL_COMMUNITY): Payer: Medicare Other

## 2017-02-10 LAB — COMPREHENSIVE METABOLIC PANEL
ALK PHOS: 68 U/L (ref 38–126)
ALT: 18 U/L (ref 14–54)
ANION GAP: 9 (ref 5–15)
AST: 21 U/L (ref 15–41)
Albumin: 3.2 g/dL — ABNORMAL LOW (ref 3.5–5.0)
BILIRUBIN TOTAL: 0.9 mg/dL (ref 0.3–1.2)
BUN: 9 mg/dL (ref 6–20)
CALCIUM: 9.1 mg/dL (ref 8.9–10.3)
CO2: 23 mmol/L (ref 22–32)
Chloride: 100 mmol/L — ABNORMAL LOW (ref 101–111)
Creatinine, Ser: 0.8 mg/dL (ref 0.44–1.00)
Glucose, Bld: 111 mg/dL — ABNORMAL HIGH (ref 65–99)
Potassium: 4.2 mmol/L (ref 3.5–5.1)
SODIUM: 132 mmol/L — AB (ref 135–145)
Total Protein: 6 g/dL — ABNORMAL LOW (ref 6.5–8.1)

## 2017-02-10 LAB — CBC
HEMATOCRIT: 34.8 % — AB (ref 36.0–46.0)
HEMOGLOBIN: 11.3 g/dL — AB (ref 12.0–15.0)
MCH: 28.5 pg (ref 26.0–34.0)
MCHC: 32.5 g/dL (ref 30.0–36.0)
MCV: 87.9 fL (ref 78.0–100.0)
Platelets: 201 10*3/uL (ref 150–400)
RBC: 3.96 MIL/uL (ref 3.87–5.11)
RDW: 14.6 % (ref 11.5–15.5)
WBC: 9.1 10*3/uL (ref 4.0–10.5)

## 2017-02-10 LAB — GLUCOSE, CAPILLARY
GLUCOSE-CAPILLARY: 119 mg/dL — AB (ref 65–99)
GLUCOSE-CAPILLARY: 131 mg/dL — AB (ref 65–99)

## 2017-02-10 MED ORDER — ONDANSETRON HCL 4 MG/2ML IJ SOLN
INTRAMUSCULAR | Status: AC
  Administered 2017-02-10: 4 mg via INTRAVENOUS
  Filled 2017-02-10: qty 2

## 2017-02-10 MED ORDER — ONDANSETRON HCL 4 MG/2ML IJ SOLN
4.0000 mg | Freq: Four times a day (QID) | INTRAMUSCULAR | Status: DC | PRN
Start: 2017-02-10 — End: 2017-02-11
  Filled 2017-02-10: qty 2

## 2017-02-10 MED ORDER — GUAIFENESIN ER 600 MG PO TB12
1200.0000 mg | ORAL_TABLET | Freq: Two times a day (BID) | ORAL | Status: DC
  Administered 2017-02-10 – 2017-02-11 (×3): 1200 mg via ORAL
  Filled 2017-02-10 (×3): qty 2

## 2017-02-10 MED ORDER — SODIUM CHLORIDE 0.9% FLUSH: 10.0000 mL | INTRAVENOUS | Status: DC | PRN

## 2017-02-10 MED ORDER — CHLORHEXIDINE GLUCONATE CLOTH 2 % EX PADS
6.0000 | MEDICATED_PAD | Freq: Every day | CUTANEOUS | Status: DC
  Administered 2017-02-10: 6 via TOPICAL

## 2017-02-10 MED ORDER — LEVALBUTEROL HCL 0.63 MG/3ML IN NEBU: 0.6300 mg | INHALATION_SOLUTION | RESPIRATORY_TRACT | Status: DC | PRN

## 2017-02-10 MED ORDER — ENOXAPARIN SODIUM 40 MG/0.4ML ~~LOC~~ SOLN
40.0000 mg | SUBCUTANEOUS | Status: DC
Start: 2017-02-10 — End: 2017-02-11
  Filled 2017-02-10: qty 0.4

## 2017-02-10 MED ORDER — SODIUM CHLORIDE 0.9% FLUSH
10.0000 mL | Freq: Two times a day (BID) | INTRAVENOUS | Status: DC
  Administered 2017-02-10 (×2): 10 mL

## 2017-02-10 MED ORDER — PANTOPRAZOLE SODIUM 40 MG PO TBEC
40.0000 mg | DELAYED_RELEASE_TABLET | Freq: Two times a day (BID) | ORAL | Status: DC
  Administered 2017-02-10 – 2017-02-11 (×3): 40 mg via ORAL
  Filled 2017-02-10 (×3): qty 1

## 2017-02-10 MED ORDER — ALUM & MAG HYDROXIDE-SIMETH 200-200-20 MG/5ML PO SUSP: 30.0000 mL | Freq: Four times a day (QID) | ORAL | Status: DC | PRN

## 2017-02-10 MED ORDER — TRAMADOL HCL 50 MG PO TABS
50.0000 mg | ORAL_TABLET | Freq: Four times a day (QID) | ORAL | Status: DC | PRN
  Administered 2017-02-11: 50 mg via ORAL
  Filled 2017-02-10: qty 1

## 2017-02-10 MED ORDER — KETOROLAC TROMETHAMINE 15 MG/ML IJ SOLN
15.0000 mg | Freq: Four times a day (QID) | INTRAMUSCULAR | Status: DC | PRN
  Administered 2017-02-10 – 2017-02-11 (×3): 15 mg via INTRAVENOUS
  Filled 2017-02-10 (×3): qty 1

## 2017-02-10 NOTE — Progress Notes (Signed)
Wasted 51ml of Dilaudid with Donnelly Angelica RN when PCA completed.

## 2017-02-10 NOTE — Discharge Summary (Signed)
Physician Discharge Summary       [address]       [place] [address]             [phone]    Patient ID: Ellen Bowers MRN: [id number] DOB/AGE: 01-12-1946 72 y.o.  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 S/p muscle sparing right thoracotomy for wedge resection of RLL 2. ABL anemia  Past Medical Diagnoses: 1. COPD (chronic obstructive pulmonary disease) ([place]) 2. GERD (gastroesophageal reflux disease) 3. Von Willebrand disease ([place]) 4. Hyperlipidemia 5. Helicobacter pylori gastritis 6. Depression 7. Asthma 8. Arthritis  Procedure (s):  RIGHT VATS, MUSCLE SPARING RIGHT THORACOTOMY MAJOR,  WEDGE RESECTION RLL by Dr. Cyndia Bent on 02/08/2017.   Pathology: Lung, resection (segmental or lobe), Right Lower Lobe - INVASIVE SQUAMOUS CELL CARCINOMA, MODERATELY DIFFERENTIATED, SPANNING 1.8 CM. - THE SURGICAL RESECTION MARGINS ARE NEGATIVE FOR CARCINOMA. - SEE ONCOLOGY TABLE BELOW. 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 Manuella Ghazi who was evaluated by Dr. Lenna Gilford in 04/2014 for a prolonged productive cough with some wheezing and hoarseness. She had a CT in 01/2013 that showed scattered sub-centimeter lung nodules. She has been followed since by Dr. Lenna Gilford with medical treatment of her COPD and bronchitis. She had a CT of the chest at [facility] on 04/23/2016 that showed advanced bilateral upper lobe emphysema and a 10 x 18 mm density in the RLL. She had a PET scan on 05/13/2016 that did not show any hypermetabolic activity in the nodule and the decision was made to follow this. She has continued to have problems with cough and shortness of breath diagnosed as acute bronchitis and COPD exacerbation. She was seen by Dr. Domenic Polite in 05/2016 for leg edema and echo was normal. The edema improved with lasix. She had a  follow up CT on 11/24/2016 which showed the RLL nodule to be larger at 2.4 x 1.1 x 1.2 cm. She had a PET scan done on 11/27/2016 that showed low level hypermetabolic activity in the nodule with an SUV of 2.4. There was another small 9 mm irregular opacity in the RUL near the surface of the lung that had low level uptake with an SUV max of 1.7. 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   Her PFTs show an FEV1 of 1.85 which is 67% of predicted. There is no significant improvement with bronchodilators. Her diffusion capacity is 48% of predicted.  She has a small peripheral right lower lobe squamous cell carcinoma and a tiny nodular density in the periphery of the right upper lobe that had trace hypermetabolic activity and is indeterminate. Her pulmonary function testing shows moderate obstructive lung disease as well as a moderate reduction in her diffusion capacity. She has some baseline shortness of breath with exertion but no chest discomfort. Her right lower lobe lung lesion is probably amenable to wedge resection which would minimize the loss of functional lung tissue. Her CT scan shows extensive emphysematous changes primarily in the upper lobes and I think that a right lower lobectomy would remove a significant amount of functional lung. Dr. Cyndia Bent thought her PFTs are probably adequate to perform right lower lobectomy but she may end up with more shortness of breath and she has now and could be oxygen dependent. Dr. Cyndia Bent told  her that he would try to perform a wedge resection if at all possible and would only perform a right lower lobectomy if the surgical margin was positive. Potential risks, benefits, and complications of the surgery were discussed with the patient and she agreed to proceed with surgery. She underwent a right VATS, muscle sparing right thoracotomy and wedge RLL on 02/08/2017.   Brief Hospital Course:  The  patient remained afebrile and hemodynamically stable. A line and foley were removed early in the post operative course. Chest tube output gradually decreased. Daily chest x rays were obtained and remained stable. Chest tube did not have an air leak. Chest tube was removed on 02/10/2017. Patient is ambulating on room air. Patient is tolerating a diet and has had a bowel movement. Wounds are clean and dry. Final chest X ray showed no pneumothorax and small right pleural effusion. She did have intermittent nausea (no emesis) but felt better and wanted to go home on 02/11/2017. Patient is felt surgically stable for discharge today.   Latest Vital Signs: Blood pressure 119/68, pulse 89, temperature 99.2 F (37.3 C), temperature source Oral, resp. rate 17, height 5' 9.5" (1.765 m), weight 212 lb 4.9 oz (96.3 kg), SpO2 98 %.  Physical Exam: Cardiovascular: RRR Pulmonary: Slightly diminished at right base Abdomen: Soft, non tender, bowel sounds present. Extremities: Trace bilateral lower extremity edema. Wounds: Clean and dry.  No erythema or signs of infection. Ecchymosis right chest wound.  Discharge Condition:Stable and discharged to home.  Recent laboratory studies:  Lab Results  Component Value Date   WBC 9.1 02/10/2017   HGB 11.3 (L) 02/10/2017   HCT 34.8 (L) 02/10/2017   MCV 87.9 02/10/2017   PLT 201 02/10/2017   Lab Results  Component Value Date   NA 132 (L) 02/10/2017   K 4.2 02/10/2017   CL 100 (L) 02/10/2017   CO2 23 02/10/2017   CREATININE 0.80 02/10/2017   GLUCOSE 111 (H) 02/10/2017    Diagnostic Studies: EXAM: CHEST  2 VIEW  COMPARISON:  02/10/2017  FINDINGS: Interval removal of right chest tube. No pneumothorax visualized. The right IJ catheter is also been removed. Similar appearance of small right pleural effusion with right basilar atelectasis.  IMPRESSION: 1. Removal of right IJ catheter and right chest tube without complications. 2. Small right pleural  effusion.   Electronically Signed   By: Kerby Moors M.D.   On: 02/11/2017 09:07   Discharge Medications: Allergies as of 02/11/2017      Reactions   Demerol [meperidine] Nausea And Vomiting   Betadine [povidone Iodine] Rash   Topical only      Medication List    STOP taking these medications   bismuth-metronidazole-tetracycline 140-125-125 MG capsule Commonly known as:  PYLERA   HYDROcodone-homatropine 5-1.5 MG/5ML syrup Commonly known as:  HYCODAN     TAKE these medications   acetaminophen 500 MG tablet Commonly known as:  TYLENOL Take 1,000 mg by mouth 2 (two) times daily as needed for moderate pain or headache.   albuterol 108 (90 Base) MCG/ACT inhaler Commonly known as:  PROVENTIL HFA;VENTOLIN HFA Inhale 2 puffs into the lungs every 6 (six) hours as needed for wheezing or shortness of breath.   alum & mag hydroxide-simeth 200-200-20 MG/5ML suspension Commonly known as:  MAALOX/MYLANTA Take 15 mLs by mouth every 6 (six) hours as needed for indigestion or heartburn.   aspirin EC 81 MG tablet Take 81 mg by mouth daily.   azelastine 0.1 % nasal spray Commonly  known as:  ASTELIN Place 2 sprays into both nostrils daily as needed for rhinitis. Use in each nostril as directed   b complex vitamins tablet Take 1 tablet by mouth daily.   budesonide 0.5 MG/2ML nebulizer solution Commonly known as:  PULMICORT Take 0.5 mg by nebulization every 6 (six) hours as needed (for shortness of breath/wheezing.).   buPROPion 200 MG 12 hr tablet Commonly known as:  WELLBUTRIN SR Take 200 mg by mouth 2 (two) times daily.   CAL-MAG PO Take 1 tablet by mouth at bedtime.   carboxymethylcellulose 0.5 % Soln Commonly known as:  REFRESH PLUS Place 1-2 drops into both eyes 2 (two) times daily as needed (for dry eyes). REFRESH   celecoxib 200 MG capsule Commonly known as:  CELEBREX Take 200 mg by mouth daily.   cetirizine 10 MG tablet Commonly known as:  ZYRTEC Take 10 mg  by mouth daily.   docusate sodium 100 MG capsule Commonly known as:  COLACE Take 100 mg by mouth 2 (two) times daily.   doxylamine (Sleep) 25 MG tablet Commonly known as:  UNISOM Take 25 mg by mouth at bedtime.   FLECTOR 1.3 % Ptch Generic drug:  diclofenac Place 0.25 patches onto the skin daily. 1/4TH OF PATCH DAILY   fluticasone 50 MCG/ACT nasal spray Commonly known as:  FLONASE Place 2 sprays into both nostrils daily as needed for allergies or rhinitis.   Fluticasone-Salmeterol 500-50 MCG/DOSE Aepb Commonly known as:  ADVAIR Inhale 1 puff into the lungs 2 (two) times daily.   ipratropium-albuterol 0.5-2.5 (3) MG/3ML Soln Commonly known as:  DUONEB Take 3 mLs by nebulization 3 (three) times daily. What changed:    when to take this  additional instructions   MIDNITE PO Take 1 tablet by mouth at bedtime.   montelukast 10 MG tablet Commonly known as:  SINGULAIR Take 10 mg by mouth daily.   oxyCODONE 5 MG immediate release tablet Commonly known as:  Oxy IR/ROXICODONE Take 5 mg by mouth every 4-6 hours PRN severe pain.   pantoprazole 40 MG tablet Commonly known as:  PROTONIX Take 1 tablet (40 mg total) by mouth 2 (two) times daily. 30 minutes before breakfast and dinner   pravastatin 20 MG tablet Commonly known as:  PRAVACHOL Take 20 mg by mouth at bedtime.   prednisoLONE acetate 1 % ophthalmic suspension Commonly known as:  PRED FORTE Place 1 drop into the right eye 2 (two) times daily.   ranitidine 150 MG tablet Commonly known as:  ZANTAC Take 150 mg at bedtime as needed by mouth for heartburn.   ROLAIDS PO Take 2 tablets by mouth as needed.   VESICARE 5 MG tablet Generic drug:  solifenacin Take 5 mg by mouth at bedtime.   vitamin C 1000 MG tablet Take 1,000 mg by mouth 2 (two) times daily.       Follow Up Appointments: Follow-up Information    Gaye Pollack, MD. Go on 02/24/2017.   Specialty:  Cardiothoracic Surgery Why:  PA/LAT CXR to be  taken (at [facility] which is in the same building as Dr. Vivi Martens office) on 02/24/2017 at 11:00 am;Appointment time is at 11:30 am Contact information: [place] [address] [phone]           Signed: Sharalyn Ink [facility] 02/11/2017, 7:32 AM

## 2017-02-10 NOTE — Progress Notes (Signed)
2 Days Post-Op Procedure(s) (LRB): VIDEO ASSISTED THORACOSCOPY (VATS)/WEDGE RESECTION (Right) THORACOTOMY MAJOR (Right) Subjective: Slept better. Pain under control. Cough that is chronic but not bringing anything up.   Objective: Vital signs in last 24 hours: Temp:  [98.4 F (36.9 C)-99.2 F (37.3 C)] 99.1 F (37.3 C) (01/30 0344) Pulse Rate:  [81-107] 97 (01/30 0700) Cardiac Rhythm: Normal sinus rhythm (01/30 0400) Resp:  [10-28] 18 (01/30 0700) BP: (104-139)/(56-99) 115/78 (01/30 0700) SpO2:  [93 %-98 %] 98 % (01/30 0700) Arterial Line BP: (161)/(85) 161/85 (01/29 0800)  Hemodynamic parameters for last 24 hours:    Intake/Output from previous day: 01/29 0701 - 01/30 0700 In: 833.2 [P.O.:500; I.V.:333.2] Out: 7711 [Urine:1080; Chest Tube:166] Intake/Output this shift: No intake/output data recorded.  General appearance: alert and cooperative Heart: regular rate and rhythm, S1, S2 normal, no murmur, click, rub or gallop Lungs: rhonchi right lung Wound: incision ok no air leak from chest tube.  Lab Results: Recent Labs    02/09/17 0413 02/10/17 0331  WBC 9.8 9.1  HGB 12.1 11.3*  HCT 36.4 34.8*  PLT 240 201   BMET:  Recent Labs    02/09/17 0413 02/10/17 0331  NA 134* 132*  K 4.2 4.2  CL 103 100*  CO2 21* 23  GLUCOSE 131* 111*  BUN 15 9  CREATININE 0.77 0.80  CALCIUM 9.2 9.1    PT/INR: No results for input(s): LABPROT, INR in the last 72 hours. ABG    Component Value Date/Time   PHART 7.423 02/09/2017 0435   HCO3 24.1 02/09/2017 0435   TCO2 28 02/08/2017 1207   ACIDBASEDEF 0.1 02/04/2017 1534   O2SAT 98.4 02/09/2017 0435   CBG (last 3)  Recent Labs    02/09/17 1548 02/09/17 2012 02/10/17 0001  GLUCAP 136* 147* 119*   CXR: ok  Assessment/Plan: S/P Procedure(s) (LRB): VIDEO ASSISTED THORACOSCOPY (VATS)/WEDGE RESECTION (Right) THORACOTOMY MAJOR (Right)  POD 2 She has been hemodynamically stable in sinus rhythm.  No air leak and CXR  stable so will remove chest tube today. Follow up CXR in am.  Rattling cough but no bringing anything up so will start Mucinex. She has chronic cough at home that may be due to chronic problems with reflux.  Reflux: will resume bid Protonix and Maalox prn.  DC PCA and start Toradol and oral pain meds.  Continue IS, add flutter valve. Continue ambulation  DC foley.  Keep central line in since her only access.  Transfer to 4E when bed available.     LOS: 2 days    Gaye Pollack 02/10/2017

## 2017-02-10 NOTE — Discharge Instructions (Signed)
Thoracotomy, Care After This sheet gives you information about how to care for yourself after your procedure. Your doctor may also give you more specific instructions. If you have problems or questions, contact your doctor. Follow these instructions at home: Preventing lung infection ( pneumonia)  Take deep breaths or do breathing exercises as told by your doctor.  Cough often. Coughing is important to clear thick spit (phlegm) and open your lungs. If coughing hurts, hold a pillow against your chest or place both hands flat on top of your cut (splinting) when you cough. This may help with discomfort.  Use an incentive spirometer as told. This is a tool that measures how well you fill your lungs with each breath.  Do lung therapy (pulmonary rehabilitation) as told. Medicines  Take over-the-counter or prescription medicines only as told by your doctor.  If you have pain, take pain-relieving medicine before your pain gets very bad. This will help you breathe and cough more comfortably.  If you were prescribed an antibiotic medicine, take it as told by your doctor. Do not stop taking the antibiotic even if you start to feel better. Activity  Ask your doctor what activities are safe for you.  Do not travel by airplane for 2 weeks after your chest tube is removed, or until your doctor says that this is safe.  Do not lift anything that is heavier than 10 lb (4.5 kg), or the limit that your doctor tells you, until he or she says that it is safe.  Do not drive until your doctor approves. ? Do not drive or use heavy machinery while taking prescription pain medicine. Incision care  Follow instructions from your doctor about how to take care of your cut from surgery (incision). Make sure you: ? Wash your hands with soap and water before you change your bandage (dressing). If you cannot use soap and water, use hand sanitizer. ? Change your bandage as told by your doctor. ? Leave stitches  (sutures), skin glue, or skin tape (adhesive) strips in place. They may need to stay in place for 2 weeks or longer. If tape strips get loose and curl up, you may trim the loose edges. Do not remove tape strips completely unless your doctor says it is okay.  Keep your bandage dry.  Check your cut from surgery every day for signs of infection. Check for: ? More redness, swelling, or pain. ? More fluid or blood. ? Warmth. ? Pus or a bad smell. Bathing  Do not take baths, swim, or use a hot tub until your doctor approves. You may take showers.  After your bandage has been removed, use soap and water to gently wash your cut from surgery. Do not use anything else to clean your cut unless your doctor tells you to. Eating and drinking  Eat a healthy diet as told by your doctor. A healthy diet includes: ? Fresh fruits and vegetables. ? Whole grains. ? Low-fat (lean) proteins.  Drink enough fluid to keep your pee (urine) clear or pale yellow. General instructions  To prevent or treat trouble pooping (constipation) while you are taking prescription pain medicine, your doctor may recommend that you: ? Take over-the-counter or prescription medicines. ? Eat foods that are high in fiber. These include fresh fruits and vegetables, whole grains, and beans. ? Limit foods that are high in fat and processed sugars, such as fried and sweet foods.  Do not use any products that contain nicotine or tobacco. These include cigarettes  and e-cigarettes. If you need help quitting, ask your doctor.  Avoid secondhand smoke.  Wear compression stockings as told. These help to prevent blood clots and reduce swelling in your legs.  If you have a chest tube, care for it as told.  Keep all follow-up visits as told by your doctor. This is important. Contact a doctor if:  You have more redness, swelling, or pain around your cut from surgery.  You have more fluid or blood coming from your cut from  surgery.  Your cut from surgery feels warm to the touch.  You have pus or a bad smell coming from your cut from surgery.  You have a fever or chills.  Your heartbeat seems uneven.  You feel sick to your stomach (nauseous).  You throw up (vomit).  You have muscle aches.  You have trouble pooping (having a bowel movement). This may mean that you: ? Poop fewer times in a week than normal. ? Have a hard time pooping. ? Have poop that is dry, hard, or bigger than normal. Get help right away if:  You get a rash.  You feel light-headed.  You feel like you might pass out (faint).  You are short of breath.  You have trouble breathing.  You are confused.  You have trouble talking.  You have problems with your seeing (vision).  You are not able to move.  You lose feeling (have numbness) in your: ? Face. ? Arms. ? Legs.  You pass out.  You have a sudden, bad headache.  You feel weak.  You have chest pain.  You have pain that: ? Is very bad. ? Gets worse, even with medicine. Summary  Take deep breaths, do breathing exercises, and cough often. This helps prevent lung infection (pneumonia).  Do not drive until your doctor approves. Do not travel by airplane for 2 weeks after your chest tube is removed, or until your doctor says that this is safe.  Check your cut from surgery every day for signs of infection.  Eat a healthy diet. This includes fresh fruits and vegetables, whole grains, and low-fat (lean) proteins. This information is not intended to replace advice given to you by your health care provider. Make sure you discuss any questions you have with your health care provider. Document Released: 06/30/2011 Document Revised: 09/23/2015 Document Reviewed: 09/23/2015 Elsevier Interactive Patient Education  2017 [employer].

## 2017-02-10 NOTE — Progress Notes (Signed)
Patient was transferred with no distress with SWOT RN Rip Harbour after report given to [employer] with no questions. Family at bedside.

## 2017-02-11 ENCOUNTER — Inpatient Hospital Stay (HOSPITAL_COMMUNITY): Payer: Medicare Other

## 2017-02-11 MED ORDER — OXYCODONE HCL 5 MG PO TABS: ORAL_TABLET | ORAL | 0 refills | Status: DC

## 2017-02-11 MED ORDER — ONDANSETRON 4 MG PO TBDP
4.0000 mg | ORAL_TABLET | Freq: Four times a day (QID) | ORAL | Status: DC | PRN
  Administered 2017-02-11: 4 mg via ORAL
  Filled 2017-02-11 (×3): qty 1

## 2017-02-11 NOTE — Care Management Note (Signed)
Case Management Note Original Note Created Ellen Mayo, RN--02/09/2017, 10:50 AM  Patient Details  Name: Ellen Bowers MRN: [id number] Date of Birth: January 12, 1946  Subjective/Objective:   From home with spouse, pta indep, POD 1 VATS, wedge resection, thoracotomy, conts with chest tube will put to water seal.  Spouse will be with patient for assistance 24/7.  She has PCP and medication coverage.                  Action/Plan: NCM will follow for dc needs.   Expected Discharge Date:  02/11/17               Expected Discharge Plan:  Home/Self Care  In-House Referral:  NA  Discharge planning Services  CM Consult  Post Acute Care Choice:  NA Choice offered to:  NA  DME Arranged:    DME Agency:     HH Arranged:    HH Agency:     Status of Service:  Completed, signed off  If discussed at [place] of Stay Meetings, dates discussed:    Discharge Disposition: home/self care  Additional Comments:  02/11/17- 1430- Ellen Gibbons RN, CM- pt for d/c home today- no CM needs noted for transition home  Ellen Bowers [place], RN 02/11/2017, 2:30 PM [phone]

## 2017-02-11 NOTE — Progress Notes (Addendum)
      [address]       [place] [address]             [phone]       3 Days Post-Op Procedure(s) (LRB): VIDEO ASSISTED THORACOSCOPY (VATS)/WEDGE RESECTION (Right) THORACOTOMY MAJOR (Right)  Subjective: Patient having some loose stools and intermittent nausea.  Objective: Vital signs in last 24 hours: Temp:  [97.7 F (36.5 C)-99.3 F (37.4 C)] 99.2 F (37.3 C) (01/31 0600) Pulse Rate:  [66-97] 89 (01/31 0600) Cardiac Rhythm: Normal sinus rhythm (01/31 0102) Resp:  [17-25] 17 (01/31 0600) BP: (118-137)/(66-80) 119/68 (01/31 0600) SpO2:  [89 %-98 %] 93 % (01/31 0600)      Intake/Output from previous day: 01/30 0701 - 01/31 0700 In: 240 [P.O.:240] Out: 280 [Urine:250; Chest Tube:30]   Physical Exam:  Cardiovascular: RRR Pulmonary: Slightly diminished at right base Abdomen: Soft, non tender, bowel sounds present. Extremities: Trace bilateral lower extremity edema. Wounds: Clean and dry.  No erythema or signs of infection. Ecchymosis right chest wound.   Lab Results: CBC: Recent Labs    02/09/17 0413 02/10/17 0331  WBC 9.8 9.1  HGB 12.1 11.3*  HCT 36.4 34.8*  PLT 240 201   BMET:  Recent Labs    02/09/17 0413 02/10/17 0331  NA 134* 132*  K 4.2 4.2  CL 103 100*  CO2 21* 23  GLUCOSE 131* 111*  BUN 15 9  CREATININE 0.77 0.80  CALCIUM 9.2 9.1    PT/INR: No results for input(s): LABPROT, INR in the last 72 hours. ABG:  INR: Will add last result for INR, ABG once components are confirmed Will add last 4 CBG results once components are confirmed  Assessment/Plan:  1. CV - SR in the 80-90's. 2.  Pulmonary - On room air. CXR appears stable. Encourage incentive spirometer. 3. Stop stool softeners 4. Zofran PRN nausea-unsure of etiology 5. Will re evaluate this afternoon. Possibly discharge later today.   Jearlean Demauro M ZimmermanPA-C 02/11/2017,7:10 AM

## 2017-02-12 ENCOUNTER — Other Ambulatory Visit: Payer: Self-pay

## 2017-02-12 MED ORDER — ONDANSETRON HCL 4 MG PO TABS: 4.0000 mg | ORAL_TABLET | Freq: Three times a day (TID) | ORAL | 0 refills | Status: AC | PRN

## 2017-02-17 NOTE — Op Note (Signed)
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 Right video-assisted thoracoscopy 2. Right muscle-sparing thoracotomy 3. 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 Manuella Ghazi who was evaluated by Dr. Lenna Gilford in 04/2014 for a prolonged productive cough with some wheezing and hoarseness. She had a CT in 01/2013 that showed scattered sub-centimeter lung nodules. She has been followed since by Dr. Lenna Gilford with medical treatment of her COPD and bronchitis. She had a CT of the chest at [facility] on 04/23/2016 that showed advanced bilateral upper lobe emphysema and a 10 x 18 mm density in the RLL. She had a PET scan on 05/13/2016 that did not show any hypermetabolic activity in the nodule and the decision was made to follow this. She has continued to have problems with cough and shortness of breath diagnosed as acute bronchitis and COPD exacerbation. She was seen by Dr. Domenic Polite in 05/2016 for leg edema and echo was normal. The edema improved with lasix. She had a follow up CT on 11/24/2016 which showed the RLL nodule to be larger at 2.4 x 1.1 x 1.2 cm. She had a PET scan done on 11/27/2016 that showed low level hypermetabolic activity in the nodule with an SUV of 2.4. There was another small 9 mm irregular opacity in the RUL near the surface of the lung that had low level uptake with an SUV max of 1.7. 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 Her PFTs show an FEV1 of 1.85 which is 67% of predicted. There is no significant improvement with bronchodilators. Her diffusion  capacity is 48% of predicted.  I discussed the operative procedure of right thoracoscopy or possibly thoracotomy for wedge resection or right lower lobectomy. I discussed alternatives, benefits, and risks including but not limited to bleeding, blood transfusion, infection, prolonged air leak, bronchial stump complications, persistent space, respiratory failure, worsening chronic shortness of breath and oxygen dependence, and recurrent cancer. She understands all this and agrees to proceed with surgery.    Preparation:  The patient was seen in the preoperative holding area and the correct patient, correct operation, correct operative side were confirmed with the patient after reviewing the medical record and CT scan. The consent was signed by me.  The right side of the chest was signed by me.  Preoperative antibiotics were given.  The patient was taken back to the operating room and positioned supine on the operating room table. After being placed under general endotracheal anesthesia by the anesthesia team using a double lumen tube a foley catheter was placed. The patient was turned into the left lateral decubitus position. The chest was prepped with betadine soap and solution.  A surgical time-out was taken and the correct patient,operative side, and operative procedure were confirmed with the nursing and anesthesia staff.   Operative Procedure:  A 1 cm incision was made in the mid-axillary line at the 8th intercostal space. The right lung was deflated. A 10 mm trocar was inserted into the pleural space and a 30 degree thoracoscope was inserted.  The pleural space was examined.  The right upper lobe of lung  was adherent to the apex of the chest in the same area where the small subpleural density had been noted on CT scan in the right upper lobe.  I thought that this density was most likely inflammatory given the adhesions to the chest wall.  I could not locate the right lower lobe nodule because it  was sitting below the surface of the lung.  A short lateral muscle-sparing thoracotomy was performed. The pleural space was entered through the 5th intercostal space.  The inferior pulmonary ligament was divided using electrocautery.  The right lower lobe was palpated and the nodule found. I did not feel any other nodules except the one noted on CT scan. This lesion was removed by wedge resection using surgical staplers. It was sent to pathology for frozen section of the lesion and the margin. The pathologist called the OR and said it was non-small cell carcinoma, with negative surgical margins of 1cm.  The suture line was coated with CoSeal.  A 28 F chest tube was placed through the trocar incision and advanced posteriorly to the apex. The ribs were re-approximated using #2 vicryl peri-costal sutures that were placed through the middle of the 6th rib after drilling small holes and then around the 5th rib.The muscles were returned to their normal position. The subcutaneous tissue was closed with 2-0 vicryl continuous suture. The skin was closed with 3-0 vicryl subcuticular suture. All sponge, needle, and instrument counts were reported correct at the end of the case. Dry sterile dressings were placed over the incisions and around the chest tubes which were connected to pleurevac suction. The patient was turned supine, extubated,then transported to the PACU in satisfactory and stable condition.

## 2017-02-23 ENCOUNTER — Other Ambulatory Visit: Payer: Self-pay | Admitting: Surgery

## 2017-02-23 DIAGNOSIS — C3430 Malignant neoplasm of lower lobe, unspecified bronchus or lung: Secondary | ICD-10-CM

## 2017-02-24 ENCOUNTER — Other Ambulatory Visit: Payer: Self-pay

## 2017-02-24 ENCOUNTER — Ambulatory Visit
Admission: RE | Admit: 2017-02-24 | Discharge: 2017-02-24 | Disposition: A | Discharge status: Home / Self Care | Payer: Medicare Other | Source: Ambulatory Visit | Attending: Surgery | Admitting: Surgery

## 2017-02-24 ENCOUNTER — Ambulatory Visit (INDEPENDENT_AMBULATORY_CARE_PROVIDER_SITE_OTHER): Payer: Self-pay | Admitting: Surgery

## 2017-02-24 ENCOUNTER — Encounter: Payer: Self-pay | Admitting: Surgery

## 2017-02-24 VITALS — BP 125/72 | HR 83 | Resp 18 | Ht 69.0 in | Wt 205.0 lb

## 2017-02-24 DIAGNOSIS — C3430 Malignant neoplasm of lower lobe, unspecified bronchus or lung: Secondary | ICD-10-CM

## 2017-02-24 NOTE — Progress Notes (Signed)
HPI: Patient returns for routine postoperative follow-up having undergone right muscle sparing thoracotomy for wedge resection of a right lower lobe squamous cell carcinoma on 02/08/2017. The patient's early postoperative recovery while in the hospital was notable for an uncomplicated postoperative course.  The final pathology showed a 1.8 cm moderately differentiated squamous cell carcinoma with negative surgical margins (T1b, Nx, M0) Since hospital discharge the patient reports that she has been feeling better.  She is walking around her house without chest pain or shortness of breath.  She has mild incisional soreness.  She is still using her incentive spirometer and getting to about 1600.  She has been doing most of her daily activities herself but her husband has been helping her getting out of the bathtub.  Her only complaint is of some intermittent nausea and she has been taking Zofran for that which seems to help.  She is not taking any narcotic pain medicine at this time.  She has been taking occasional ibuprofen, Tylenol, and Robaxin.   Current Outpatient Medications  Medication Sig Dispense Refill  . acetaminophen (TYLENOL) 500 MG tablet Take 1,000 mg by mouth 2 (two) times daily as needed for moderate pain or headache.    . albuterol (PROVENTIL HFA;VENTOLIN HFA) 108 (90 Base) MCG/ACT inhaler Inhale 2 puffs into the lungs every 6 (six) hours as needed for wheezing or shortness of breath.    [name] alum & mag hydroxide-simeth (MAALOX/MYLANTA) 200-200-20 MG/5ML suspension Take 15 mLs by mouth every 6 (six) hours as needed for indigestion or heartburn.     . Ascorbic Acid (VITAMIN C) 1000 MG tablet Take 1,000 mg by mouth 2 (two) times daily.     [name] aspirin EC 81 MG tablet Take 81 mg by mouth daily.    [name] azelastine (ASTELIN) 0.1 % nasal spray Place 2 sprays into both nostrils daily as needed for rhinitis. Use in each nostril as directed    . b complex vitamins tablet Take 1 tablet by mouth  daily.    . budesonide (PULMICORT) 0.5 MG/2ML nebulizer solution Take 0.5 mg by nebulization every 6 (six) hours as needed (for shortness of breath/wheezing.).     [name] buPROPion (WELLBUTRIN SR) 200 MG 12 hr tablet Take 200 mg by mouth 2 (two) times daily.    . Ca Carbonate-Mag Hydroxide (ROLAIDS PO) Take 2 tablets by mouth as needed.    . Calcium-Magnesium (CAL-MAG PO) Take 1 tablet by mouth at bedtime.    . carboxymethylcellulose (REFRESH PLUS) 0.5 % SOLN Place 1-2 drops into both eyes 2 (two) times daily as needed (for dry eyes). REFRESH    . celecoxib (CELEBREX) 200 MG capsule Take 200 mg by mouth daily.     . cetirizine (ZYRTEC) 10 MG tablet Take 10 mg by mouth daily.    . diclofenac (FLECTOR) 1.3 % PTCH Place 0.25 patches onto the skin daily. 1/4TH OF PATCH DAILY    . docusate sodium (COLACE) 100 MG capsule Take 100 mg by mouth 2 (two) times daily.     [name] doxylamine, Sleep, (UNISOM) 25 MG tablet Take 25 mg by mouth at bedtime.     . fluticasone (FLONASE) 50 MCG/ACT nasal spray Place 2 sprays into both nostrils daily as needed for allergies or rhinitis.    . Fluticasone-Salmeterol (ADVAIR) 500-50 MCG/DOSE AEPB Inhale 1 puff into the lungs 2 (two) times daily.     [name] ipratropium-albuterol (DUONEB) 0.5-2.5 (3) MG/3ML SOLN Take 3 mLs by nebulization 3 (three) times  daily. (Patient taking differently: Take 3 mLs by nebulization See admin instructions. Scheduled twice daily, may use additional dose as needed) 360 mL 11  . Misc Natural Products (MIDNITE PO) Take 1 tablet by mouth at bedtime.    . montelukast (SINGULAIR) 10 MG tablet Take 10 mg by mouth daily.     . ondansetron (ZOFRAN) 4 MG tablet Take 1 tablet (4 mg total) by mouth every 8 (eight) hours as needed for nausea or vomiting. 20 tablet 0  . pantoprazole (PROTONIX) 40 MG tablet Take 1 tablet (40 mg total) by mouth 2 (two) times daily. 30 minutes before breakfast and dinner 60 tablet 6  . pravastatin (PRAVACHOL) 20 MG tablet Take 20 mg by mouth  at bedtime.     . prednisoLONE acetate (PRED FORTE) 1 % ophthalmic suspension Place 1 drop into the right eye 2 (two) times daily.    . ranitidine (ZANTAC) 150 MG tablet Take 150 mg at bedtime as needed by mouth for heartburn.    . solifenacin (VESICARE) 5 MG tablet Take 5 mg by mouth at bedtime.     . traMADol (ULTRAM) 50 MG tablet Take 50 mg by mouth every 6 (six) hours as needed.     No current facility-administered medications for this visit.     Physical Exam: BP 125/72 (BP Location: Left Arm, Patient Position: Sitting, Cuff Size: Large)   Pulse 83   Resp 18   Ht 5\' 9"  (1.753 m)   Wt 205 lb (93 kg)   SpO2 97% Comment: RA  BMI 30.27 kg/m  She looks well. Cardiac exam shows a regular rate and rhythm with normal heart sounds. Lung exam is clear. The right chest incision is healing well.  The chest tube suture was removed.  Diagnostic Tests:  CLINICAL DATA:  Lung cancer postop  EXAM: CHEST  2 VIEW  COMPARISON:  02/11/2017  FINDINGS: COPD with pulmonary hyperinflation.  Postop changes on the right with pleural fluid stable from the prior study. Mild right lower lobe airspace disease stable most consistent with atelectasis or infiltrate.  Left lung clear.  Negative for heart failure.  IMPRESSION: Postop pleural fluid on the right is stable. Mild right lower lobe airspace disease is stable.   Electronically Signed   By: Franchot Gallo M.D.   On: 02/24/2017 11:22  Impression:  Overall I think she is making good progress following her surgery.  I encouraged her to continue ambulating and use her incentive spirometer.  It is not clear why she has intermittent nausea since she is not on any new medications.  She has had some constipation and it could be related to that.  She has started taking MiraLAX.  She also has a history of significant reflux and takes Protonix twice a day as well as Maalox as needed.  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  Plan:  I will plan to see her back in 1 month with a chest x-ray and then will see her at 6 months with a CT scan of the chest for surveillance.   Gaye Pollack, MD [facility] [phone]

## 2017-03-11 ENCOUNTER — Telehealth: Payer: Self-pay

## 2017-03-11 NOTE — Telephone Encounter (Signed)
Mrs Ellen Bowers called about loosing voice and coughing since Saturday.Her cough is dry and when she does get up any sputum, it's clear.  She saw her PCP on Monday and was given Z-Pak/5day and Hycodan. She is also taking IBU for pain and mucinex for cough. She does not having any fevers, no shortness of breath. Her incision sites have no signs of infection. She is just concerned about the increased pain with the cough. I told her she was on the appropriate med's., and to finish her ABX, if no improvement call back and we could move up her appt with Dr Cyndia Bent with CXR  on 03/24/17.

## 2017-03-16 ENCOUNTER — Other Ambulatory Visit: Payer: Self-pay | Admitting: Surgery

## 2017-03-16 DIAGNOSIS — C3490 Malignant neoplasm of unspecified part of unspecified bronchus or lung: Secondary | ICD-10-CM

## 2017-03-17 ENCOUNTER — Ambulatory Visit
Admission: RE | Admit: 2017-03-17 | Discharge: 2017-03-17 | Disposition: A | Discharge status: Home / Self Care | Payer: Medicare Other | Source: Ambulatory Visit | Attending: Surgery | Admitting: Surgery

## 2017-03-17 ENCOUNTER — Other Ambulatory Visit: Payer: Self-pay

## 2017-03-17 ENCOUNTER — Encounter: Payer: Self-pay | Admitting: Surgery

## 2017-03-17 ENCOUNTER — Ambulatory Visit (INDEPENDENT_AMBULATORY_CARE_PROVIDER_SITE_OTHER): Payer: Self-pay | Admitting: Surgery

## 2017-03-17 VITALS — BP 137/76 | HR 81 | Resp 18 | Ht 69.0 in | Wt 204.2 lb

## 2017-03-17 DIAGNOSIS — C3490 Malignant neoplasm of unspecified part of unspecified bronchus or lung: Secondary | ICD-10-CM

## 2017-03-17 DIAGNOSIS — C3430 Malignant neoplasm of lower lobe, unspecified bronchus or lung: Secondary | ICD-10-CM

## 2017-03-17 NOTE — Progress Notes (Signed)
HPI: Patient returns for routine postoperative follow-up having undergone right VATS and muscle sparing thoracotomy for wedge resection of a right lower lobe squamous cell carcinoma  on 02/08/2017.  The final pathology showed a T1b moderately differentiated invasive squamous cell carcinoma measuring 1.8 cm with negative resection margins. The patient's early postoperative recovery while in the hospital was notable for an uncomplicated postoperative course. Since hospital discharge the patient reports that she developed a sore and hoarse throat as well as increased cough.  She was started on a Z-Pak by her primary physician.  She says that her sore throat resolved but she still has some hoarseness and has increased right chest wall discomfort from the coughing that she was doing.  Ibuprofen has been giving her relief.  She denies any shortness of breath.  She has had no fever or chills.  She has not been coughing up any sputum.   Current Outpatient Medications  Medication Sig Dispense Refill  . acetaminophen (TYLENOL) 500 MG tablet Take 1,000 mg by mouth 2 (two) times daily as needed for moderate pain or headache.    . albuterol (PROVENTIL HFA;VENTOLIN HFA) 108 (90 Base) MCG/ACT inhaler Inhale 2 puffs into the lungs every 6 (six) hours as needed for wheezing or shortness of breath.    [name] alum & mag hydroxide-simeth (MAALOX/MYLANTA) 200-200-20 MG/5ML suspension Take 15 mLs by mouth every 6 (six) hours as needed for indigestion or heartburn.     . Ascorbic Acid (VITAMIN C) 1000 MG tablet Take 1,000 mg by mouth 2 (two) times daily.     [name] azelastine (ASTELIN) 0.1 % nasal spray Place 2 sprays into both nostrils daily as needed for rhinitis. Use in each nostril as directed    . b complex vitamins tablet Take 1 tablet by mouth daily.    . budesonide (PULMICORT) 0.5 MG/2ML nebulizer solution Take 0.5 mg by nebulization every 6 (six) hours as needed (for shortness of breath/wheezing.).     [name] buPROPion  (WELLBUTRIN SR) 200 MG 12 hr tablet Take 200 mg by mouth 2 (two) times daily.    . Ca Carbonate-Mag Hydroxide (ROLAIDS PO) Take 2 tablets by mouth as needed.    . Calcium-Magnesium (CAL-MAG PO) Take 1 tablet by mouth at bedtime.    . carboxymethylcellulose (REFRESH PLUS) 0.5 % SOLN Place 1-2 drops into both eyes 2 (two) times daily as needed (for dry eyes). REFRESH    . cetirizine (ZYRTEC) 10 MG tablet Take 10 mg by mouth daily.    . diclofenac (FLECTOR) 1.3 % PTCH Place 0.25 patches onto the skin daily. 1/4TH OF PATCH DAILY    . docusate sodium (COLACE) 100 MG capsule Take 100 mg by mouth 2 (two) times daily.     [name] doxylamine, Sleep, (UNISOM) 25 MG tablet Take 25 mg by mouth at bedtime.     . fluticasone (FLONASE) 50 MCG/ACT nasal spray Place 2 sprays into both nostrils daily as needed for allergies or rhinitis.    . Fluticasone-Salmeterol (ADVAIR) 500-50 MCG/DOSE AEPB Inhale 1 puff into the lungs 2 (two) times daily.     [name] ibuprofen (ADVIL,MOTRIN) 200 MG tablet Take 200 mg by mouth every 6 (six) hours as needed.    [name] ipratropium-albuterol (DUONEB) 0.5-2.5 (3) MG/3ML SOLN Take 3 mLs by nebulization 3 (three) times daily. (Patient taking differently: Take 3 mLs by nebulization See admin instructions. Scheduled twice daily, may use additional dose as needed) 360 mL 11  . Misc Natural Products (MIDNITE  PO) Take 1 tablet by mouth at bedtime.    . montelukast (SINGULAIR) 10 MG tablet Take 10 mg by mouth daily.     . ondansetron (ZOFRAN) 4 MG tablet Take 1 tablet (4 mg total) by mouth every 8 (eight) hours as needed for nausea or vomiting. 20 tablet 0  . pantoprazole (PROTONIX) 40 MG tablet Take 1 tablet (40 mg total) by mouth 2 (two) times daily. 30 minutes before breakfast and dinner 60 tablet 6  . pravastatin (PRAVACHOL) 20 MG tablet Take 20 mg by mouth at bedtime.     . prednisoLONE acetate (PRED FORTE) 1 % ophthalmic suspension Place 1 drop into the right eye 2 (two) times daily.    . ranitidine  (ZANTAC) 150 MG tablet Take 150 mg at bedtime as needed by mouth for heartburn.    . solifenacin (VESICARE) 5 MG tablet Take 5 mg by mouth at bedtime.     . celecoxib (CELEBREX) 200 MG capsule Take 200 mg by mouth daily.     [name] HYDROcodone-homatropine (HYCODAN) 5-1.5 MG/5ML syrup TAKE 1 TEASPOONFUL (5ML) BY MOUTH EVERY 6 HOURS AS NEEDED  0   No current facility-administered medications for this visit.     Physical Exam: BP 137/76 (BP Location: Left Arm, Patient Position: Sitting, Cuff Size: Large)   Pulse 81   Resp 18   Ht 5\' 9"  (1.753 m)   Wt 204 lb 3.2 oz (92.6 kg)   SpO2 95% Comment: RA  BMI 30.16 kg/m  She looks well. Cardiac exam shows a regular rate and rhythm with normal heart sounds. Lungs are clear. The right chest incision is healing well.  Diagnostic Tests:  CLINICAL DATA:  Post VATS.  Shortness of breath, cough  EXAM: CHEST - 2 VIEW  COMPARISON:  02/24/2017  FINDINGS: Heart is borderline in size. Small right pleural effusion. Bibasilar atelectasis. No acute bony abnormality. No real change since prior study.  IMPRESSION: Small right pleural effusion with bibasilar atelectasis. No real change.   Electronically Signed   By: Rolm Baptise M.D.   On: 03/17/2017 08:47   Impression:  Overall I think she is making good progress following her surgery especially considering her underlying lung disease.  I think her chest x-ray looks good with minimal residual fluid at the right costophrenic angle that is unchanged from her prior chest x-ray.  Her lungs are clear and she is healing up well.  I encouraged her to continue using her incentive spirometer and to avoid doing any heavy lifting.  I would expect her chest wall discomfort to gradually improve over the next couple months.  She does not require any additional therapy for this completely resected small squamous cell carcinoma.  She will require continued surveillance.  Plan: I will see her back in 3  months with a chest x-ray and then will plan to follow her up in 6 months with a CT of the chest.   Gaye Pollack, MD [facility] [phone]

## 2017-03-24 ENCOUNTER — Encounter: Payer: Medicare Other | Admitting: Surgery

## 2017-03-31 ENCOUNTER — Encounter (INDEPENDENT_AMBULATORY_CARE_PROVIDER_SITE_OTHER): Payer: Self-pay | Admitting: Orthopaedic Surgery

## 2017-03-31 ENCOUNTER — Ambulatory Visit (INDEPENDENT_AMBULATORY_CARE_PROVIDER_SITE_OTHER): Payer: Medicare Other | Admitting: Orthopaedic Surgery

## 2017-03-31 DIAGNOSIS — G8929 Other chronic pain: Secondary | ICD-10-CM | POA: Diagnosis not present

## 2017-03-31 DIAGNOSIS — M25561 Pain in right knee: Secondary | ICD-10-CM | POA: Diagnosis not present

## 2017-03-31 MED ORDER — LIDOCAINE HCL 1 % IJ SOLN
2.0000 mL | INTRAMUSCULAR | Status: AC | PRN
  Administered 2017-03-31: 2 mL

## 2017-03-31 MED ORDER — BUPIVACAINE HCL 0.5 % IJ SOLN
2.0000 mL | INTRAMUSCULAR | Status: AC | PRN
  Administered 2017-03-31: 2 mL via INTRA_ARTICULAR

## 2017-03-31 MED ORDER — METHYLPREDNISOLONE ACETATE 40 MG/ML IJ SUSP
80.0000 mg | INTRAMUSCULAR | Status: AC | PRN
  Administered 2017-03-31: 80 mg

## 2017-03-31 NOTE — Progress Notes (Signed)
Office Visit Note   Patient: Ellen Bowers           Date of Birth: [birth year]           MRN: [id number] Visit Date: 03/31/2017              Requested by: [name], MD [address] [place], [place] [address] PCP: [name], MD   Assessment & Plan: Visit Diagnoses:  1. Chronic pain of right knee     Plan: History of osteoarthritis right knee with prior cortisone injection.  Has had exacerbation of her pain. will reinject with cortisone and monitor her response  Follow-Up Instructions: Return in about 1 week (around 04/07/2017).   Orders:  No orders of the defined types were placed in this encounter.  No orders of the defined types were placed in this encounter.     Procedures: Large Joint Inj: R knee on 03/31/2017 2:32 PM Indications: pain and diagnostic evaluation Details: 25 G 1.5 in needle, anteromedial approach  Arthrogram: No  Medications: 2 mL lidocaine 1 %; 2 mL bupivacaine 0.5 %; 80 mg methylPREDNISolone acetate 40 MG/ML Procedure, treatment alternatives, risks and benefits explained, specific risks discussed. Consent was given by the patient. Immediately prior to procedure a time out was called to verify the correct patient, procedure, equipment, support staff and site/side marked as required. Patient was prepped and draped in the usual sterile fashion.       Clinical Data: No additional findings.   Subjective: No chief complaint on file. Recurrent right knee pain presumably on the basis of her osteoarthritis.  Prior films demonstrate arthritis and she has done well with cortisone injections.  She denies any history of injury or trauma.  The last 2 months she has had a single solitary pulmonary nodule excised from her right long well.  Prior films performed a year ago demonstrate tricompartmental degenerative changes predominate in the lateral compartment with increased valgus and near bone-on-bone appearance  HPI  Review of  Systems   Objective: Vital Signs: There were no vitals taken for this visit.  Physical Exam  Ortho Exam awake alert and oriented x3.  Comfortable sitting.  Right knee with minimal effusion.  Some medial joint pain.  Full extension and flexion comparable to left knee.  No instability.  No calf pain.  No distal edema.  Neurovascular exam intact.  Painless range of motion both hips  Specialty Comments:  No specialty comments available.  Imaging: No results found.   PMFS History: Patient Active Problem List   Diagnosis Date Noted  . Lung cancer, lower lobe ([place]) 02/08/2017  . Dyspepsia   . Unilateral primary osteoarthritis, right knee 10/23/2016  . COPD with acute exacerbation ([place]) 06/30/2016  . Anorectal fissure 10/24/2014  . Family history of colorectal cancer 10/24/2014  . History of colon polyps 10/24/2014  . History of pneumonia 06/07/2014  . COPD mixed type ([place]) 06/07/2014  . Pulmonary nodule 06/07/2014  . Abdominal pain, epigastric 05/07/2014  . Hemorrhoids 05/07/2014  . Rectal bleeding   . Rectal pain   . Rectal pain, chronic 12/28/2013  . GERD (gastroesophageal reflux disease) 09/06/2013  . Chronic cough 09/06/2013   Past Medical History:  Diagnosis Date  . Anxiety   . Arthritis    R knee, bilateral hips, hands & neck- being given steroid injections   . Asthma   . Blood dyscrasia   . Cancer (HCC)    L hand  . COPD (chronic obstructive pulmonary disease) ([place])   .  Depression   . Dyspnea   . GERD (gastroesophageal reflux disease)   . Helicobacter pylori gastritis 2005  . Hyperlipidemia   . 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 Von Willebrand disease ([place]) 1996    Family History  Problem Relation Age of Onset  . Colon cancer Mother        Less than age 55  . Colon cancer Father        More than age 30, died from metastatic disease at age 56  . Liver disease Neg Hx     Past Surgical History:  Procedure Laterality Date  . BIOPSY   12/08/2016   Procedure: BIOPSY;  Surgeon: Danie Binder, MD;  [facility];  Service: Endoscopy;;  . BRAVO [place] STUDY N/A 12/08/2016   Procedure: BRAVO [place]; Bravo Capsule Placement;  Surgeon: Danie Binder, MD;  [facility];  Service: Endoscopy;  Laterality: N/A;  . BREAST LUMPECTOMY Left    benign  . CATARACT EXTRACTION     double  . COLONOSCOPY N/A 05/30/2012   Dr.Mann:Multiple polyps removed from the right colon-see description above; otherwise normal colonoscopy up to the cecum. 5 polyps, multiple tubular adenomas. next tcs 05/2015  . COLONOSCOPY N/A 12/13/2015   Procedure: COLONOSCOPY;  Surgeon: Danie Binder, MD;  [facility];  Service: Endoscopy;  Laterality: N/A;  8:30 am  . ESOPHAGOGASTRODUODENOSCOPY  2012   Dr. Collene Mares: few erosions in duodenal bulb. BRAVO off PPI ("severe reflux")  . ESOPHAGOGASTRODUODENOSCOPY N/A 06/12/2014   SLF: 1. The mucosa of the esophagus appeared normal 2. Mild non-erosive gastrtitis.   [name] ESOPHAGOGASTRODUODENOSCOPY (EGD) WITH PROPOFOL N/A 12/08/2016   Procedure: ESOPHAGOGASTRODUODENOSCOPY (EGD) WITH PROPOFOL;  Surgeon: Danie Binder, MD;  [facility];  Service: Endoscopy;  Laterality: N/A;  7:30am  . EYE SURGERY Bilateral    L& R  . FLEXIBLE SIGMOIDOSCOPY N/A 02/05/2014   Dr. Oneida Alar: 5, 2-3 mm sessile sigmoid colon polyps removed, small internal hemorrhoids, moderate external hemorrhoids, 2 anal fissures present. Polyps were hyperplastic.   [name] NECK SURGERY  1951   cyst removal  . NECK SURGERY  1998   growth removed  . THORACOTOMY Right 02/08/2017   Procedure: THORACOTOMY MAJOR;  Surgeon: Gaye Pollack, MD;  [facility] OR;  Service: Thoracic;  Laterality: Right;  . TONSILLECTOMY  1951  . TUBAL LIGATION  1984  . VIDEO ASSISTED THORACOSCOPY (VATS)/WEDGE RESECTION Right 02/08/2017   Procedure: VIDEO ASSISTED THORACOSCOPY (VATS)/WEDGE RESECTION;  Surgeon: Gaye Pollack, MD;  Location: [facility] OR;  Service:  Thoracic;  Laterality: Right;   Social History   Occupational History  . Not on file  Tobacco Use  . Smoking status: Former Smoker    Packs/day: 1.00    Years: 50.00    Pack years: 50.00    Types: Cigarettes    Start date: 03/17/1961    Last attempt to quit: 05/30/2009    Years since quitting: 7.8  . Smokeless tobacco: Never Used  . Tobacco comment: Quit smoking x 7 years  Substance and Sexual Activity  . Alcohol use: Yes    Comment: Occasionally    . Drug use: No  . Sexual activity: Not on file     Garald Balding, MD   Note - This record has been created using [employer].  Chart creation errors have been sought, but may not always  have been located. Such creation errors do  not reflect on  the standard of medical care.

## 2017-04-07 ENCOUNTER — Other Ambulatory Visit: Payer: Self-pay | Admitting: Pulmonary Disease

## 2017-04-07 ENCOUNTER — Ambulatory Visit: Payer: Medicare Other | Admitting: Nurse Practitioner

## 2017-04-14 ENCOUNTER — Encounter (INDEPENDENT_AMBULATORY_CARE_PROVIDER_SITE_OTHER): Payer: Self-pay | Admitting: Orthopaedic Surgery

## 2017-04-14 ENCOUNTER — Ambulatory Visit (INDEPENDENT_AMBULATORY_CARE_PROVIDER_SITE_OTHER): Payer: Medicare Other | Admitting: Orthopaedic Surgery

## 2017-04-14 VITALS — BP 126/76 | HR 83 | Resp 18 | Ht 69.5 in | Wt 198.0 lb

## 2017-04-14 DIAGNOSIS — M25551 Pain in right hip: Secondary | ICD-10-CM

## 2017-04-14 MED ORDER — BUPIVACAINE HCL 0.5 % IJ SOLN
2.0000 mL | INTRAMUSCULAR | Status: AC | PRN
  Administered 2017-04-14: 2 mL via INTRA_ARTICULAR

## 2017-04-14 MED ORDER — LIDOCAINE HCL 1 % IJ SOLN
2.0000 mL | INTRAMUSCULAR | Status: AC | PRN
  Administered 2017-04-14: 2 mL

## 2017-04-14 MED ORDER — METHYLPREDNISOLONE ACETATE 40 MG/ML IJ SUSP
80.0000 mg | INTRAMUSCULAR | Status: AC | PRN
  Administered 2017-04-14: 80 mg

## 2017-04-14 NOTE — Progress Notes (Signed)
Office Visit Note   Patient: Ellen Bowers           Date of Birth: Sep 06, 1945           MRN: [id number] Visit Date: 04/14/2017              Requested by: [name], MD [address] [place], [place] [address] PCP: [name], MD   Assessment & Plan: Visit Diagnoses:  1. Pain of right hip joint     Plan: Recurrent greater trochanteric bursitis right hip.  Will inject with cortisone and monitor response  Follow-Up Instructions: Return if symptoms worsen or fail to improve.   Orders:  Orders Placed This Encounter  Procedures  . Large Joint Inj: R greater trochanter   No orders of the defined types were placed in this encounter.     Procedures: Large Joint Inj: R greater trochanter on 04/14/2017 3:21 PM Indications: pain and diagnostic evaluation Details: 25 G 1.5 in needle  Arthrogram: No  Medications: 2 mL lidocaine 1 %; 2 mL bupivacaine 0.5 %; 80 mg methylPREDNISolone acetate 40 MG/ML Procedure, treatment alternatives, risks and benefits explained, specific risks discussed. Consent was given by the patient. Immediately prior to procedure a time out was called to verify the correct patient, procedure, equipment, support staff and site/side marked as required. Patient was prepped and draped in the usual sterile fashion.       Clinical Data: No additional findings.   Subjective: Chief Complaint  Patient presents with  . Right Hip - Pain  . Follow-up    pt would like r hip injection  Mrs. Hoadley has been seen in the past for evaluation of pain the greater trochanteric region of both of her hips.  She has had cortisone with good relief of her pain.  She is now experiencing recurrent pain right side.  Numbness or tingling.  No back discomfort.  No groin pain.  No history of injury or trauma  HPI  Review of Systems   Objective: Vital Signs: BP 126/76 (BP Location: Right Arm, Patient Position: Sitting, Cuff Size: Normal)   Pulse 83   Resp 18   Ht 5' 9.5"  (1.765 m)   Wt 198 lb (89.8 kg)   BMI 28.82 kg/m   Physical Exam  Ortho Exam awake alert and oriented x3.  Comfortable sitting.  No pain in right groin with range of motion.  Local tenderness over the greater trochanter without any skin changes.  Straight leg raise negative.  Specialty Comments:  No specialty comments available.  Imaging: No results found.   PMFS History: Patient Active Problem List   Diagnosis Date Noted  . Lung cancer, lower lobe ([place]) 02/08/2017  . Dyspepsia   . Unilateral primary osteoarthritis, right knee 10/23/2016  . COPD with acute exacerbation ([place]) 06/30/2016  . Anorectal fissure 10/24/2014  . Family history of colorectal cancer 10/24/2014  . History of colon polyps 10/24/2014  . History of pneumonia 06/07/2014  . COPD mixed type ([place]) 06/07/2014  . Pulmonary nodule 06/07/2014  . Abdominal pain, epigastric 05/07/2014  . Hemorrhoids 05/07/2014  . Rectal bleeding   . Rectal pain   . Rectal pain, chronic 12/28/2013  . GERD (gastroesophageal reflux disease) 09/06/2013  . Chronic cough 09/06/2013   Past Medical History:  Diagnosis Date  . Anxiety   . Arthritis    R knee, bilateral hips, hands & neck- being given steroid injections   . Asthma   . Blood dyscrasia   .  Cancer (HCC)    L hand  . COPD (chronic obstructive pulmonary disease) ([place])   . Depression   . Dyspnea   . GERD (gastroesophageal reflux disease)   . Helicobacter pylori gastritis 2005  . Hyperlipidemia   . 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 Von Willebrand disease ([place]) 1996    Family History  Problem Relation Age of Onset  . Colon cancer Mother        Less than age 76  . Colon cancer Father        More than age 65, died from metastatic disease at age 56  . Liver disease Neg Hx     Past Surgical History:  Procedure Laterality Date  . BIOPSY  12/08/2016   Procedure: BIOPSY;  Surgeon: Danie Binder, MD;  [facility];  Service: Endoscopy;;    . BRAVO [place] STUDY N/A 12/08/2016   Procedure: BRAVO [place]; Bravo Capsule Placement;  Surgeon: Danie Binder, MD;  [facility];  Service: Endoscopy;  Laterality: N/A;  . BREAST LUMPECTOMY Left    benign  . CATARACT EXTRACTION     double  . COLONOSCOPY N/A 05/30/2012   Dr.Mann:Multiple polyps removed from the right colon-see description above; otherwise normal colonoscopy up to the cecum. 5 polyps, multiple tubular adenomas. next tcs 05/2015  . COLONOSCOPY N/A 12/13/2015   Procedure: COLONOSCOPY;  Surgeon: Danie Binder, MD;  [facility];  Service: Endoscopy;  Laterality: N/A;  8:30 am  . ESOPHAGOGASTRODUODENOSCOPY  2012   Dr. Collene Mares: few erosions in duodenal bulb. BRAVO off PPI ("severe reflux")  . ESOPHAGOGASTRODUODENOSCOPY N/A 06/12/2014   SLF: 1. The mucosa of the esophagus appeared normal 2. Mild non-erosive gastrtitis.   [name] ESOPHAGOGASTRODUODENOSCOPY (EGD) WITH PROPOFOL N/A 12/08/2016   Procedure: ESOPHAGOGASTRODUODENOSCOPY (EGD) WITH PROPOFOL;  Surgeon: Danie Binder, MD;  [facility];  Service: Endoscopy;  Laterality: N/A;  7:30am  . EYE SURGERY Bilateral    L& R  . FLEXIBLE SIGMOIDOSCOPY N/A 02/05/2014   Dr. Oneida Alar: 5, 2-3 mm sessile sigmoid colon polyps removed, small internal hemorrhoids, moderate external hemorrhoids, 2 anal fissures present. Polyps were hyperplastic.   [name] NECK SURGERY  1951   cyst removal  . NECK SURGERY  1998   growth removed  . THORACOTOMY Right 02/08/2017   Procedure: THORACOTOMY MAJOR;  Surgeon: Gaye Pollack, MD;  Location: [facility] OR;  Service: Thoracic;  Laterality: Right;  . TONSILLECTOMY  1951  . TUBAL LIGATION  1984  . VIDEO ASSISTED THORACOSCOPY (VATS)/WEDGE RESECTION Right 02/08/2017   Procedure: VIDEO ASSISTED THORACOSCOPY (VATS)/WEDGE RESECTION;  Surgeon: Gaye Pollack, MD;  [facility];  Service: Thoracic;  Laterality: Right;   Social History   Occupational History  . Not on file  Tobacco Use  .  Smoking status: Former Smoker    Packs/day: 1.00    Years: 50.00    Pack years: 50.00    Types: Cigarettes    Start date: 03/17/1961    Last attempt to quit: 05/30/2009    Years since quitting: 7.8  . Smokeless tobacco: Never Used  . Tobacco comment: Quit smoking x 7 years  Substance and Sexual Activity  . Alcohol use: Yes    Comment: Occasionally    . Drug use: No  . Sexual activity: Not on file     Garald Balding, MD   Note - This record has been created using [employer].  Chart creation errors have been sought, but may not always  have been located. Such creation errors do not reflect on  the standard of medical care.

## 2017-04-14 NOTE — Progress Notes (Deleted)
Office Visit Note   Patient: Ellen Bowers           Date of Birth: 04-27-1945           MRN: [id number] Visit Date: 04/14/2017              Requested by: [name], MD [address] [place], [place] [address] PCP: [name], MD   Assessment & Plan: Visit Diagnoses: No diagnosis found.  Plan: ***  Follow-Up Instructions: No follow-ups on file.   Orders:  No orders of the defined types were placed in this encounter.  No orders of the defined types were placed in this encounter.     Procedures: No procedures performed   Clinical Data: No additional findings.   Subjective: No chief complaint on file.   HPI  Review of Systems  Constitutional: Positive for fatigue.  HENT: Negative for ear pain.   Eyes: Negative for pain.  Respiratory: Positive for cough and shortness of breath.   Cardiovascular: Negative for leg swelling.  Gastrointestinal: Negative for constipation and diarrhea.  Genitourinary: Negative for difficulty urinating.  Musculoskeletal: Positive for back pain. Negative for neck pain.  Skin: Negative for rash and wound.  Allergic/Immunologic: Negative for food allergies.  Neurological: Positive for weakness and numbness. Negative for dizziness, light-headedness and headaches.  Hematological: Does not bruise/bleed easily.  Psychiatric/Behavioral: Positive for sleep disturbance.     Objective: Vital Signs: BP 126/76 (BP Location: Right Arm, Patient Position: Sitting, Cuff Size: Normal)   Pulse 83   Resp 18   Ht 5' 9.5" (1.765 m)   Wt 198 lb (89.8 kg)   BMI 28.82 kg/m   Physical Exam  Ortho Exam  Specialty Comments:  No specialty comments available.  Imaging: No results found.   PMFS History: Patient Active Problem List   Diagnosis Date Noted  . Lung cancer, lower lobe ([place]) 02/08/2017  . Dyspepsia   . Unilateral primary osteoarthritis, right knee 10/23/2016  . COPD with acute exacerbation ([place]) 06/30/2016  . Anorectal fissure  10/24/2014  . Family history of colorectal cancer 10/24/2014  . History of colon polyps 10/24/2014  . History of pneumonia 06/07/2014  . COPD mixed type ([place]) 06/07/2014  . Pulmonary nodule 06/07/2014  . Abdominal pain, epigastric 05/07/2014  . Hemorrhoids 05/07/2014  . Rectal bleeding   . Rectal pain   . Rectal pain, chronic 12/28/2013  . GERD (gastroesophageal reflux disease) 09/06/2013  . Chronic cough 09/06/2013   Past Medical History:  Diagnosis Date  . Anxiety   . Arthritis    R knee, bilateral hips, hands & neck- being given steroid injections   . Asthma   . Blood dyscrasia   . Cancer (HCC)    L hand  . COPD (chronic obstructive pulmonary disease) ([place])   . Depression   . Dyspnea   . GERD (gastroesophageal reflux disease)   . Helicobacter pylori gastritis 2005  . Hyperlipidemia   . 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 Von Willebrand disease ([place]) 1996    Family History  Problem Relation Age of Onset  . Colon cancer Mother        Less than age 50  . Colon cancer Father        More than age 32, died from metastatic disease at age 69  . Liver disease Neg Hx     Past Surgical History:  Procedure Laterality Date  . BIOPSY  12/08/2016   Procedure:  BIOPSY;  Surgeon: Danie Binder, MD;  Location: [facility];  Service: Endoscopy;;  . BRAVO [place] STUDY N/A 12/08/2016   Procedure: BRAVO [place]; Bravo Capsule Placement;  Surgeon: Danie Binder, MD;  Location: [facility];  Service: Endoscopy;  Laterality: N/A;  . BREAST LUMPECTOMY Left    benign  . CATARACT EXTRACTION     double  . COLONOSCOPY N/A 05/30/2012   Dr.Mann:Multiple polyps removed from the right colon-see description above; otherwise normal colonoscopy up to the cecum. 5 polyps, multiple tubular adenomas. next tcs 05/2015  . COLONOSCOPY N/A 12/13/2015   Procedure: COLONOSCOPY;  Surgeon: Danie Binder, MD;  Location: [facility];  Service: Endoscopy;  Laterality: N/A;  8:30 am  .  ESOPHAGOGASTRODUODENOSCOPY  2012   Dr. Collene Mares: few erosions in duodenal bulb. BRAVO off PPI ("severe reflux")  . ESOPHAGOGASTRODUODENOSCOPY N/A 06/12/2014   SLF: 1. The mucosa of the esophagus appeared normal 2. Mild non-erosive gastrtitis.   [name] ESOPHAGOGASTRODUODENOSCOPY (EGD) WITH PROPOFOL N/A 12/08/2016   Procedure: ESOPHAGOGASTRODUODENOSCOPY (EGD) WITH PROPOFOL;  Surgeon: Danie Binder, MD;  Location: [facility];  Service: Endoscopy;  Laterality: N/A;  7:30am  . EYE SURGERY Bilateral    L& R  . FLEXIBLE SIGMOIDOSCOPY N/A 02/05/2014   Dr. Oneida Alar: 5, 2-3 mm sessile sigmoid colon polyps removed, small internal hemorrhoids, moderate external hemorrhoids, 2 anal fissures present. Polyps were hyperplastic.   [name] NECK SURGERY  1951   cyst removal  . NECK SURGERY  1998   growth removed  . THORACOTOMY Right 02/08/2017   Procedure: THORACOTOMY MAJOR;  Surgeon: Gaye Pollack, MD;  Location: [facility];  Service: Thoracic;  Laterality: Right;  . TONSILLECTOMY  1951  . TUBAL LIGATION  1984  . VIDEO ASSISTED THORACOSCOPY (VATS)/WEDGE RESECTION Right 02/08/2017   Procedure: VIDEO ASSISTED THORACOSCOPY (VATS)/WEDGE RESECTION;  Surgeon: Gaye Pollack, MD;  Location: [facility] OR;  Service: Thoracic;  Laterality: Right;   Social History   Occupational History  . Not on file  Tobacco Use  . Smoking status: Former Smoker    Packs/day: 1.00    Years: 50.00    Pack years: 50.00    Types: Cigarettes    Start date: 03/17/1961    Last attempt to quit: 05/30/2009    Years since quitting: 7.8  . Smokeless tobacco: Never Used  . Tobacco comment: Quit smoking x 7 years  Substance and Sexual Activity  . Alcohol use: Yes    Comment: Occasionally    . Drug use: No  . Sexual activity: Not on file

## 2017-05-19 ENCOUNTER — Ambulatory Visit: Payer: Medicare Other | Admitting: Pulmonary Disease

## 2017-05-19 ENCOUNTER — Encounter: Payer: Self-pay | Admitting: Pulmonary Disease

## 2017-05-19 VITALS — BP 130/78 | HR 91 | Temp 98.2°F | Ht 69.0 in | Wt 201.2 lb

## 2017-05-19 DIAGNOSIS — C3431 Malignant neoplasm of lower lobe, right bronchus or lung: Secondary | ICD-10-CM | POA: Diagnosis not present

## 2017-05-19 DIAGNOSIS — J449 Chronic obstructive pulmonary disease, unspecified: Secondary | ICD-10-CM

## 2017-05-19 DIAGNOSIS — Z8701 Personal history of pneumonia (recurrent): Secondary | ICD-10-CM | POA: Diagnosis not present

## 2017-05-19 MED ORDER — HYDROCODONE-HOMATROPINE 5-1.5 MG/5ML PO SYRP: ORAL_SOLUTION | ORAL | 0 refills | Status: DC
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
## 2017-05-19 NOTE — Patient Instructions (Signed)
Today we updated your med list in our EPIC system...    Continue your current medications the same...  We refilled your Hycodan per request...  Stay as active as possible...  Call for any questions...  Let's plan a follow up visit in 62mo, sooner if needed for problems.Marland KitchenMarland Kitchen

## 2017-05-20 ENCOUNTER — Encounter: Payer: Self-pay | Admitting: Gastroenterology

## 2017-05-20 ENCOUNTER — Ambulatory Visit: Payer: Medicare Other | Admitting: Gastroenterology

## 2017-05-20 DIAGNOSIS — K602 Anal fissure, unspecified: Secondary | ICD-10-CM

## 2017-05-20 DIAGNOSIS — K219 Gastro-esophageal reflux disease without esophagitis: Secondary | ICD-10-CM

## 2017-05-20 DIAGNOSIS — R1013 Epigastric pain: Secondary | ICD-10-CM | POA: Diagnosis not present

## 2017-05-20 MED ORDER — BACLOFEN 10 MG PO TABS
ORAL_TABLET | ORAL | 3 refills | Status: DC
Start: 2017-05-20 — End: 2017-06-30

## 2017-05-20 NOTE — Patient Instructions (Addendum)
TO REDUCE REGURGITATION/COUGH:     1. AVOID REFLUX TRIGGERS. SEE INFO BELOW.    2. CONTINUE [place]. TAKE 30 MINUTES PRIOR TO MEALS TWICE DAILY.    3. USE MAALOX OR MYLANTA WHEN NEEDED TO CONTROL HEARTBURN.    4. USE BACLOFEN 30 MINS PRIOR TO MEALS UP TO THREE TIMES A DAY OR 1 HOUR PRIOR TO BEDTIME.   PLEASE CALL WITH QUESTIONS OR CONCERNS.  FOLLOW UP IN 3 MOS.    Lifestyle and home remedies TO CONTROL HEARTBURN You may eliminate or reduce the frequency of heartburn by making the following lifestyle changes:  . Control your weight. Being overweight is a major risk factor for heartburn and GERD. Excess pounds put pressure on your abdomen, pushing up your stomach and causing acid to back up into your esophagus.   . Eat smaller meals. 4 TO 6 MEALS A DAY. This reduces pressure on the lower esophageal sphincter, helping to prevent the valve from opening and acid from washing back into your esophagus.   Dolphus Jenny your belt. Clothes that fit tightly around your waist put pressure on your abdomen and the lower esophageal sphincter.   . Eliminate heartburn triggers. Everyone has specific triggers. Common triggers such as fatty or fried foods, spicy food, tomato sauce, carbonated beverages, alcohol, chocolate, mint, garlic, onion, caffeine and nicotine may make heartburn worse.   [name] Avoid stooping or bending. Tying your shoes is OK. Bending over for longer periods to weed your garden isn't, especially soon after eating.   . Don't lie down after a meal. Wait at least three to four hours after eating before going to bed, and don't lie down right after eating.   Alternative medicine . Several home remedies exist for treating GERD, but they provide only temporary relief. They include drinking baking soda (sodium bicarbonate) added to water or drinking other fluids such as baking soda mixed with cream of tartar and water. . Although these liquids create temporary relief by neutralizing, washing away or  buffering acids, eventually they aggravate the situation by adding gas and fluid to your stomach, increasing pressure and causing more acid reflux. Further, adding more sodium to your diet may increase your blood pressure and add stress to your heart, and excessive bicarbonate ingestion can alter the acid-base balance in your body.

## 2017-05-20 NOTE — Assessment & Plan Note (Signed)
SYMPTOMS CONTROLLED/RESOLVED.  CONTINUE TO MONITOR SYMPTOMS. 
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
## 2017-05-20 NOTE — Assessment & Plan Note (Addendum)
SYMPTOMS NOT IDEALLY CONTROLLED: PROBLEMS WITH REGURGITATION AT NIGHTTIME.  TO REDUCE REGURGITATION/COUGH:     1. AVOID REFLUX TRIGGERS. SEE INFO BELOW.    2. CONTINUE [place]. TAKE 30 MINUTES PRIOR TO MEALS TWICE DAILY.    3. USE MAALOX OR MYLANTA WHEN NEEDED TO CONTROL HEARTBURN.    4. USE BACLOFEN 30 MINS PRIOR TO MEALS UP TO THREE TIMES A DAY OR 1 HOUR PRIOR TO BEDTIME. MED SIDE EFFECTS DISCUSSED.  PLEASE CALL WITH QUESTIONS OR CONCERNS.  FOLLOW UP IN 3 MOS.

## 2017-06-29 ENCOUNTER — Other Ambulatory Visit: Payer: Self-pay | Admitting: Surgery

## 2017-06-29 DIAGNOSIS — C3430 Malignant neoplasm of lower lobe, unspecified bronchus or lung: Secondary | ICD-10-CM

## 2017-06-30 ENCOUNTER — Encounter: Payer: Self-pay | Admitting: Surgery

## 2017-06-30 ENCOUNTER — Ambulatory Visit
Admission: RE | Admit: 2017-06-30 | Discharge: 2017-06-30 | Disposition: A | Discharge status: Home / Self Care | Payer: Medicare Other | Source: Ambulatory Visit | Attending: Surgery | Admitting: Surgery

## 2017-06-30 ENCOUNTER — Ambulatory Visit: Payer: Medicare Other | Admitting: Surgery

## 2017-06-30 VITALS — BP 128/79 | HR 83 | Resp 20 | Ht 69.0 in | Wt 203.0 lb

## 2017-06-30 DIAGNOSIS — C3431 Malignant neoplasm of lower lobe, right bronchus or lung: Secondary | ICD-10-CM | POA: Diagnosis not present

## 2017-06-30 DIAGNOSIS — C3430 Malignant neoplasm of lower lobe, unspecified bronchus or lung: Secondary | ICD-10-CM

## 2017-06-30 NOTE — Progress Notes (Signed)
HPI:  Patient returns for routine follow-up having undergone right VATS and muscle sparing thoracotomy for wedge resection of a right lower lobe squamous cell carcinoma  on 02/08/2017.  The final pathology showed a T1b moderately differentiated invasive squamous cell carcinoma measuring 1.8 cm with negative resection margins.  She has been feeling well overall but still reports some exertional shortness of breath.  She has significant underlying COPD.  She still has some mild discomfort along the distribution of the intercostal nerve beneath the right breast.    Current Outpatient Medications  Medication Sig Dispense Refill  . acetaminophen (TYLENOL) 500 MG tablet Take 1,000 mg by mouth 2 (two) times daily as needed for moderate pain or headache.    . albuterol (PROVENTIL HFA;VENTOLIN HFA) 108 (90 Base) MCG/ACT inhaler Inhale 2 puffs into the lungs every 6 (six) hours as needed for wheezing or shortness of breath.    [name] alum & mag hydroxide-simeth (MAALOX/MYLANTA) 200-200-20 MG/5ML suspension Take 15 mLs by mouth every 6 (six) hours as needed for indigestion or heartburn.     . Ascorbic Acid (VITAMIN C) 1000 MG tablet Take 1,000 mg by mouth 2 (two) times daily.     [name] azelastine (ASTELIN) 0.1 % nasal spray Place 2 sprays into both nostrils daily as needed for rhinitis. Use in each nostril as directed    . b complex vitamins tablet Take 1 tablet by mouth daily.    . budesonide (PULMICORT) 0.5 MG/2ML nebulizer solution Take 0.5 mg by nebulization daily.     [name] buPROPion (WELLBUTRIN SR) 200 MG 12 hr tablet Take 200 mg by mouth 2 (two) times daily.    . Ca Carbonate-Mag Hydroxide (ROLAIDS PO) Take 2 tablets by mouth as needed.    . Calcium-Magnesium (CAL-MAG PO) Take 1 tablet by mouth at bedtime.    . carboxymethylcellulose (REFRESH PLUS) 0.5 % SOLN Place 1-2 drops into both eyes 2 (two) times daily as needed (for dry eyes). REFRESH    . celecoxib (CELEBREX) 200 MG capsule Take 200 mg by mouth  daily.     . cetirizine (ZYRTEC) 10 MG tablet Take 10 mg by mouth daily.    . Cholecalciferol (VITAMIN D PO) Take by mouth daily. Unsure of dose    . diclofenac (FLECTOR) 1.3 % PTCH Place 0.25 patches onto the skin as needed. 1/4TH OF PATCH DAILY     . docusate sodium (COLACE) 100 MG capsule Take 100 mg by mouth 2 (two) times daily.     [name] doxylamine, Sleep, (UNISOM) 25 MG tablet Take 25 mg by mouth at bedtime.     . fluticasone (FLONASE) 50 MCG/ACT nasal spray Place 2 sprays into both nostrils daily as needed for allergies or rhinitis.    . Fluticasone-Salmeterol (ADVAIR) 500-50 MCG/DOSE AEPB Inhale 1 puff into the lungs 2 (two) times daily.     [name] HYDROcodone-homatropine (HYCODAN) 5-1.5 MG/5ML syrup TAKE 1 TEASPOONFUL (5ML) BY MOUTH EVERY 6 HOURS AS NEEDED 473 mL 0  . ibuprofen (ADVIL,MOTRIN) 200 MG tablet Take 200 mg by mouth every 6 (six) hours as needed.    [name] ipratropium-albuterol (DUONEB) 0.5-2.5 (3) MG/3ML SOLN Take 3 mLs by nebulization 3 (three) times daily. (Patient taking differently: Take 3 mLs by nebulization 3 (three) times daily. Scheduled twice daily, may use additional dose as needed) 360 mL 11  . Misc Natural Products (MIDNITE PO) Take 1 tablet by mouth at bedtime.    . montelukast (SINGULAIR) 10 MG tablet Take 10 mg  by mouth daily.     . ondansetron (ZOFRAN) 4 MG tablet Take 1 tablet (4 mg total) by mouth every 8 (eight) hours as needed for nausea or vomiting. 20 tablet 0  . pantoprazole (PROTONIX) 40 MG tablet TAKE ONE TABLET BY MOUTH TWICE DAILY - 30 MINUTES BEFORE BREAKFAST & DINNER 60 tablet 6  . pravastatin (PRAVACHOL) 20 MG tablet Take 20 mg by mouth at bedtime.     . ranitidine (ZANTAC) 150 MG tablet Take 150 mg at bedtime as needed by mouth for heartburn.    . solifenacin (VESICARE) 5 MG tablet Take 5 mg by mouth at bedtime.     . Zoledronic Acid (RECLAST IV) Inject into the vein. Once per year     No current facility-administered medications for this visit.       Physical Exam: BP 128/79   Pulse 83   Resp 20   Ht 5\' 9"  (1.753 m)   Wt 203 lb (92.1 kg)   SpO2 94% Comment: RA  BMI 29.98 kg/m  She looks well. There is no cervical or supraclavicular adenopathy. Lungs reveal slight wheeze on the right.  They are otherwise clear. Cardiac exam shows a regular rate and rhythm with normal heart sounds. The right chest incision is well-healed.  There are no skin lesions.  Diagnostic Tests:  CLINICAL DATA:  Status post VATS procedure for lung carcinoma  EXAM: CHEST - 2 VIEW  COMPARISON:  March 27, 2017  FINDINGS: Lungs overall are hyperexpanded. There is mild scarring on the right. No edema or consolidation. No evident mass or adenopathy. Heart size normal. Pulmonary vascularity normal. No adenopathy. No bone lesions.  IMPRESSION: Lungs hyperexpanded without edema or consolidation. Areas of scarring on the right, stable. No edema or consolidation. No mass or adenopathy evident.   Electronically Signed   By: Lowella Grip III M.D.   On: 06/30/2017 10:28   Impression:  Overall I think she is making a good recovery following her surgery.  She has significant underlying lung disease and I would expect her to have some pulmonary limitation with exertion.  Her chest x-ray today looks fine.  There is no pleural effusion and no mass or adenopathy present.  Her preoperative PET scan showed a 9 mm irregular opacity in the posterior aspect of the right upper lobe which had mild FDG uptake with an SUV max of 1.7. This was noted on her prior chest CT of 11/24/2016 as well as the PET scan on 05/14/2016 and was not substantially changed compared to those studies.  I reviewed the chest CT scan from 06/13/2014 and it was present on that scan but not as noticeable.  This will require continued follow-up.  I have reviewed the chest x-ray and CT/PET scans with her and her husband today and answered their questions.  Plan:  I will see her back in  3 months with a CT scan of the chest for lung cancer surveillance and to follow-up on the 9 mm right upper lobe opacity.   I spent 15 minutes performing this established patient evaluation and > 50% of this time was spent face to face counseling and coordinating the care of this patient's lung cancer surveillance.    Gaye Pollack, MD [facility] [phone]

## 2017-07-28 ENCOUNTER — Encounter (INDEPENDENT_AMBULATORY_CARE_PROVIDER_SITE_OTHER): Payer: Self-pay | Admitting: Orthopaedic Surgery

## 2017-07-28 ENCOUNTER — Ambulatory Visit (INDEPENDENT_AMBULATORY_CARE_PROVIDER_SITE_OTHER): Payer: Self-pay

## 2017-07-28 ENCOUNTER — Ambulatory Visit (INDEPENDENT_AMBULATORY_CARE_PROVIDER_SITE_OTHER): Payer: Medicare Other | Admitting: Orthopaedic Surgery

## 2017-07-28 VITALS — BP 107/69 | HR 91 | Ht 69.0 in | Wt 203.0 lb

## 2017-07-28 DIAGNOSIS — M1711 Unilateral primary osteoarthritis, right knee: Secondary | ICD-10-CM | POA: Diagnosis not present

## 2017-07-28 DIAGNOSIS — M25561 Pain in right knee: Secondary | ICD-10-CM

## 2017-07-28 MED ORDER — LIDOCAINE HCL 1 % IJ SOLN
2.0000 mL | INTRAMUSCULAR | Status: AC | PRN
  Administered 2017-07-28: 2 mL

## 2017-07-28 MED ORDER — BUPIVACAINE HCL 0.5 % IJ SOLN
2.0000 mL | INTRAMUSCULAR | Status: AC | PRN
  Administered 2017-07-28: 2 mL via INTRA_ARTICULAR

## 2017-07-28 MED ORDER — METHYLPREDNISOLONE ACETATE 40 MG/ML IJ SUSP
80.0000 mg | INTRAMUSCULAR | Status: AC | PRN
  Administered 2017-07-28: 80 mg

## 2017-07-28 NOTE — Progress Notes (Signed)
Office Visit Note   Patient: Ellen Bowers           Date of Birth: [birth year]           MRN: [id number] Visit Date: 07/28/2017              Requested by: [name], MD [address] [place], [place] [address] PCP: [name], MD   Assessment & Plan: Visit Diagnoses:  1. Acute pain of right knee   2. Unilateral primary osteoarthritis, right knee     Plan: Is on injection lateral compartment right knee.  Pre-certify [place] supplementation  Follow-Up Instructions: Return in about 3 weeks (around 08/18/2017).   Orders:  Orders Placed This Encounter  Procedures  . Large Joint Inj: R knee   No orders of the defined types were placed in this encounter.     Procedures: Large Joint Inj: R knee on 07/28/2017 2:06 PM Indications: pain and diagnostic evaluation Details: 25 G 1.5 in needle, anteromedial approach  Arthrogram: No  Medications: 2 mL lidocaine 1 %; 2 mL bupivacaine 0.5 %; 80 mg methylPREDNISolone acetate 40 MG/ML Procedure, treatment alternatives, risks and benefits explained, specific risks discussed. Consent was given by the patient. Immediately prior to procedure a time out was called to verify the correct patient, procedure, equipment, support staff and site/side marked as required. Patient was prepped and draped in the usual sterile fashion.       Clinical Data: No additional findings.   Subjective: Chief Complaint  Patient presents with  . Follow-up    R KNEE PAIN FOR 2-3 MO NO INJURY GOTTEN WORSE LAST MONTH  Mrs. Camp is known to have osteoarthritis of her right knee per prior films.  Her last films in 2018 is rated near complete collapse of the lateral compartment with osteophytes and subchondral sclerosis.  She is had a recent exacerbation with achiness soreness and sometimes a feeling of her knee giving way.  HPI  Review of Systems  Constitutional: Negative for fatigue and fever.  HENT: Negative for ear pain.   Eyes: Negative for pain.    Respiratory: Negative for cough and shortness of breath.   Cardiovascular: Negative for leg swelling.  Gastrointestinal: Negative for constipation and diarrhea.  Genitourinary: Negative for difficulty urinating.  Musculoskeletal: Positive for back pain and neck pain.  Skin: Negative for rash.  Allergic/Immunologic: Negative for food allergies.  Neurological: Positive for weakness. Negative for numbness.  Hematological: Does not bruise/bleed easily.  Psychiatric/Behavioral: Positive for sleep disturbance.     Objective: Vital Signs: BP 107/69 (BP Location: Left Arm, Patient Position: Sitting, Cuff Size: Normal)   Pulse 91   Ht 5\' 9"  (1.753 m)   Wt 203 lb (92.1 kg)   BMI 29.98 kg/m   Physical Exam  Constitutional: She is oriented to person, place, and time. She appears well-developed and well-nourished.  HENT:  Mouth/Throat: Oropharynx is clear and moist.  Eyes: Pupils are equal, round, and reactive to light. EOM are normal.  Pulmonary/Chest: Effort normal.  Neurological: She is alert and oriented to person, place, and time.  Skin: Skin is warm and dry.  Psychiatric: She has a normal mood and affect. Her behavior is normal.    Ortho Exam alert and oriented x3.  Comfortable sitting.  Right knee without effusion.  Increased valgus with weightbearing.  Predominant lateral joint pain.  Full extension over 100 degrees of flexion no instability.  No popliteal pain.  No calf pain.  No distal edema.  Foot  was warm with good pulses.  Straight leg raise negative.  Painless range of motion right hip  Specialty Comments:  No specialty comments available.  Imaging: No results found.   PMFS History: Patient Active Problem List   Diagnosis Date Noted  . Lung cancer, lower lobe ([place]) 02/08/2017  . Dyspepsia   . COPD with acute exacerbation ([place]) 06/30/2016  . Anorectal fissure 10/24/2014  . History of colon polyps 10/24/2014  . History of pneumonia 06/07/2014  . COPD mixed type  ([place]) 06/07/2014  . Hemorrhoids 05/07/2014  . GERD (gastroesophageal reflux disease) 09/06/2013  . Chronic cough 09/06/2013   Past Medical History:  Diagnosis Date  . Anxiety   . Arthritis    R knee, bilateral hips, hands & neck- being given steroid injections   . Asthma   . Blood dyscrasia   . Cancer (HCC)    L hand  . COPD (chronic obstructive pulmonary disease) ([place])   . Depression   . Dyspnea   . GERD (gastroesophageal reflux disease)   . Helicobacter pylori gastritis 2005  . Hyperlipidemia   . 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 Von Willebrand disease ([place]) 1996    Family History  Problem Relation Age of Onset  . Colon cancer Mother        Less than age 44  . Colon cancer Father        More than age 20, died from metastatic disease at age 80  . Liver disease Neg Hx     Past Surgical History:  Procedure Laterality Date  . BIOPSY  12/08/2016   Procedure: BIOPSY;  Surgeon: Danie Binder, MD;  [facility];  Service: Endoscopy;;  . BRAVO [place] STUDY N/A 12/08/2016   Procedure: BRAVO [place]; Bravo Capsule Placement;  Surgeon: Danie Binder, MD;  [facility];  Service: Endoscopy;  Laterality: N/A;  . BREAST LUMPECTOMY Left    benign  . CATARACT EXTRACTION     double  . COLONOSCOPY N/A 05/30/2012   Dr.Mann:Multiple polyps removed from the right colon-see description above; otherwise normal colonoscopy up to the cecum. 5 polyps, multiple tubular adenomas. next tcs 05/2015  . COLONOSCOPY N/A 12/13/2015   Procedure: COLONOSCOPY;  Surgeon: Danie Binder, MD;  [facility];  Service: Endoscopy;  Laterality: N/A;  8:30 am  . ESOPHAGOGASTRODUODENOSCOPY  2012   Dr. Collene Mares: few erosions in duodenal bulb. BRAVO off PPI ("severe reflux")  . ESOPHAGOGASTRODUODENOSCOPY N/A 06/12/2014   SLF: 1. The mucosa of the esophagus appeared normal 2. Mild non-erosive gastrtitis.   [name] ESOPHAGOGASTRODUODENOSCOPY (EGD) WITH PROPOFOL N/A 12/08/2016    Procedure: ESOPHAGOGASTRODUODENOSCOPY (EGD) WITH PROPOFOL;  Surgeon: Danie Binder, MD;  [facility];  Service: Endoscopy;  Laterality: N/A;  7:30am  . EYE SURGERY Bilateral    L& R  . FLEXIBLE SIGMOIDOSCOPY N/A 02/05/2014   Dr. Oneida Alar: 5, 2-3 mm sessile sigmoid colon polyps removed, small internal hemorrhoids, moderate external hemorrhoids, 2 anal fissures present. Polyps were hyperplastic.   [name] NECK SURGERY  1951   cyst removal  . NECK SURGERY  1998   growth removed  . THORACOTOMY Right 02/08/2017   Procedure: THORACOTOMY MAJOR;  Surgeon: Gaye Pollack, MD;  Location: [facility] OR;  Service: Thoracic;  Laterality: Right;  . TONSILLECTOMY  1951  . TUBAL LIGATION  1984  . VIDEO ASSISTED THORACOSCOPY (VATS)/WEDGE RESECTION Right 02/08/2017   Procedure: VIDEO ASSISTED THORACOSCOPY (VATS)/WEDGE RESECTION;  Surgeon: Gaye Pollack, MD;  Location: [facility] OR;  Service: Thoracic;  Laterality: Right;   Social History   Occupational History  . Not on file  Tobacco Use  . Smoking status: Former Smoker    Packs/day: 1.00    Years: 50.00    Pack years: 50.00    Types: Cigarettes    Start date: 03/17/1961    Last attempt to quit: 05/30/2009    Years since quitting: 8.1  . Smokeless tobacco: Never Used  . Tobacco comment: Quit smoking x 7 years  Substance and Sexual Activity  . Alcohol use: Yes    Comment: Occasionally    . Drug use: No  . Sexual activity: Not on file

## 2017-07-30 ENCOUNTER — Other Ambulatory Visit: Payer: Self-pay | Admitting: *Deleted

## 2017-07-30 DIAGNOSIS — R918 Other nonspecific abnormal finding of lung field: Secondary | ICD-10-CM

## 2017-07-30 DIAGNOSIS — R911 Solitary pulmonary nodule: Secondary | ICD-10-CM

## 2017-07-30 NOTE — Progress Notes (Unsigned)
ct 

## 2017-08-18 ENCOUNTER — Encounter (INDEPENDENT_AMBULATORY_CARE_PROVIDER_SITE_OTHER): Payer: Self-pay | Admitting: Orthopaedic Surgery

## 2017-08-18 ENCOUNTER — Ambulatory Visit (INDEPENDENT_AMBULATORY_CARE_PROVIDER_SITE_OTHER): Payer: Medicare Other | Admitting: Orthopaedic Surgery

## 2017-08-18 VITALS — BP 111/67 | HR 84 | Ht 69.0 in | Wt 203.0 lb

## 2017-08-18 DIAGNOSIS — M1711 Unilateral primary osteoarthritis, right knee: Secondary | ICD-10-CM | POA: Diagnosis not present

## 2017-08-18 MED ORDER — SODIUM HYALURONATE (VISCOSUP) 20 MG/2ML IX SOSY
20.0000 mg | PREFILLED_SYRINGE | INTRA_ARTICULAR | Status: AC | PRN
  Administered 2017-08-18: 20 mg via INTRA_ARTICULAR

## 2017-08-18 NOTE — Progress Notes (Signed)
Office Visit Note   Patient: Ellen Bowers           Date of Birth: 1945-11-20           MRN: [id number] Visit Date: 08/18/2017              Requested by: [name], MD [address] [place], [place] [address] PCP: [name], MD   Assessment & Plan: Visit Diagnoses:  1. Unilateral primary osteoarthritis, right knee     Plan: First Euflexxa injection right knee.  Follow-up weekly for the next 2 weeks to complete the series  Follow-Up Instructions: Return in about 1 week (around 08/25/2017).   Orders:  Orders Placed This Encounter  Procedures  . Large Joint Inj: R knee   No orders of the defined types were placed in this encounter.     Procedures: Large Joint Inj: R knee on 08/18/2017 12:00 PM Indications: pain and joint swelling Details: 25 G 1.5 in needle  Arthrogram: No  Medications: 20 mg Sodium Hyaluronate 20 MG/2ML Outcome: tolerated well, no immediate complications Procedure, treatment alternatives, risks and benefits explained, specific risks discussed. Consent was given by the patient. Immediately prior to procedure a time out was called to verify the correct patient, procedure, equipment, support staff and site/side marked as required. Patient was prepped and draped in the usual sterile fashion.       Clinical Data: No additional findings.   Subjective: Chief Complaint  Patient presents with  . Follow-up    # 1 R KNEE EUFLEXXA INJECTION, CORTISONE INJECTION HELPED SOME    HPI  Review of Systems  Constitutional: Negative for fatigue and fever.  HENT: Negative for ear pain.   Eyes: Negative for pain.  Respiratory: Positive for cough and shortness of breath.   Cardiovascular: Negative for leg swelling.  Gastrointestinal: Negative for constipation and diarrhea.  Genitourinary: Negative for difficulty urinating.  Musculoskeletal: Negative for back pain and neck pain.  Skin: Negative for rash.  Allergic/Immunologic: Negative for food allergies.    Neurological: Positive for weakness. Negative for numbness.  Hematological: Does not bruise/bleed easily.  Psychiatric/Behavioral: Negative for sleep disturbance.     Objective: Vital Signs: BP 111/67 (BP Location: Right Arm, Patient Position: Sitting, Cuff Size: Normal)   Pulse 84   Ht 5\' 9"  (1.753 m)   Wt 203 lb (92.1 kg)   BMI 29.98 kg/m   Physical Exam  Ortho Exam alert and oriented x3.  Comfortable sitting.  Right knee without effusion.  No increased heat.  Predominant lateral joint pain and increased valgus with weightbearing.  No instability.  Specialty Comments:  No specialty comments available.  Imaging: No results found.   PMFS History: Patient Active Problem List   Diagnosis Date Noted  . Unilateral primary osteoarthritis, right knee 08/18/2017  . Lung cancer, lower lobe ([place]) 02/08/2017  . Dyspepsia   . COPD with acute exacerbation ([place]) 06/30/2016  . Anorectal fissure 10/24/2014  . History of colon polyps 10/24/2014  . History of pneumonia 06/07/2014  . COPD mixed type ([place]) 06/07/2014  . Hemorrhoids 05/07/2014  . GERD (gastroesophageal reflux disease) 09/06/2013  . Chronic cough 09/06/2013   Past Medical History:  Diagnosis Date  . Anxiety   . Arthritis    R knee, bilateral hips, hands & neck- being given steroid injections   . Asthma   . Blood dyscrasia   . Cancer (HCC)    L hand  . COPD (chronic obstructive pulmonary disease) ([place])   .  Depression   . Dyspnea   . GERD (gastroesophageal reflux disease)   . Helicobacter pylori gastritis 2005  . Hyperlipidemia   . 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 Von Willebrand disease ([place]) 1996    Family History  Problem Relation Age of Onset  . Colon cancer Mother        Less than age 48  . Colon cancer Father        More than age 61, died from metastatic disease at age 6  . Liver disease Neg Hx     Past Surgical History:  Procedure Laterality Date  . BIOPSY  12/08/2016    Procedure: BIOPSY;  Surgeon: Danie Binder, MD;  [facility];  Service: Endoscopy;;  . BRAVO [place] STUDY N/A 12/08/2016   Procedure: BRAVO [place]; Bravo Capsule Placement;  Surgeon: Danie Binder, MD;  [facility];  Service: Endoscopy;  Laterality: N/A;  . BREAST LUMPECTOMY Left    benign  . CATARACT EXTRACTION     double  . COLONOSCOPY N/A 05/30/2012   Dr.Mann:Multiple polyps removed from the right colon-see description above; otherwise normal colonoscopy up to the cecum. 5 polyps, multiple tubular adenomas. next tcs 05/2015  . COLONOSCOPY N/A 12/13/2015   Procedure: COLONOSCOPY;  Surgeon: Danie Binder, MD;  [facility];  Service: Endoscopy;  Laterality: N/A;  8:30 am  . ESOPHAGOGASTRODUODENOSCOPY  2012   Dr. Collene Mares: few erosions in duodenal bulb. BRAVO off PPI ("severe reflux")  . ESOPHAGOGASTRODUODENOSCOPY N/A 06/12/2014   SLF: 1. The mucosa of the esophagus appeared normal 2. Mild non-erosive gastrtitis.   [name] ESOPHAGOGASTRODUODENOSCOPY (EGD) WITH PROPOFOL N/A 12/08/2016   Procedure: ESOPHAGOGASTRODUODENOSCOPY (EGD) WITH PROPOFOL;  Surgeon: Danie Binder, MD;  [facility];  Service: Endoscopy;  Laterality: N/A;  7:30am  . EYE SURGERY Bilateral    L& R  . FLEXIBLE SIGMOIDOSCOPY N/A 02/05/2014   Dr. Oneida Alar: 5, 2-3 mm sessile sigmoid colon polyps removed, small internal hemorrhoids, moderate external hemorrhoids, 2 anal fissures present. Polyps were hyperplastic.   [name] NECK SURGERY  1951   cyst removal  . NECK SURGERY  1998   growth removed  . THORACOTOMY Right 02/08/2017   Procedure: THORACOTOMY MAJOR;  Surgeon: Gaye Pollack, MD;  Location: [facility] OR;  Service: Thoracic;  Laterality: Right;  . TONSILLECTOMY  1951  . TUBAL LIGATION  1984  . VIDEO ASSISTED THORACOSCOPY (VATS)/WEDGE RESECTION Right 02/08/2017   Procedure: VIDEO ASSISTED THORACOSCOPY (VATS)/WEDGE RESECTION;  Surgeon: Gaye Pollack, MD;  Location: [facility];  Service: Thoracic;   Laterality: Right;   Social History   Occupational History  . Not on file  Tobacco Use  . Smoking status: Former Smoker    Packs/day: 1.00    Years: 50.00    Pack years: 50.00    Types: Cigarettes    Start date: 03/17/1961    Last attempt to quit: 05/30/2009    Years since quitting: 8.2  . Smokeless tobacco: Never Used  . Tobacco comment: Quit smoking x 7 years  Substance and Sexual Activity  . Alcohol use: Yes    Comment: Occasionally    . Drug use: No  . Sexual activity: Not on file

## 2017-08-25 ENCOUNTER — Encounter (INDEPENDENT_AMBULATORY_CARE_PROVIDER_SITE_OTHER): Payer: Self-pay | Admitting: Orthopaedic Surgery

## 2017-08-25 ENCOUNTER — Ambulatory Visit (INDEPENDENT_AMBULATORY_CARE_PROVIDER_SITE_OTHER): Payer: Medicare Other | Admitting: Orthopaedic Surgery

## 2017-08-25 ENCOUNTER — Telehealth: Payer: Self-pay

## 2017-08-25 VITALS — BP 112/66 | HR 86 | Ht 69.0 in | Wt 200.0 lb

## 2017-08-25 DIAGNOSIS — M1711 Unilateral primary osteoarthritis, right knee: Secondary | ICD-10-CM

## 2017-08-25 MED ORDER — SODIUM HYALURONATE (VISCOSUP) 20 MG/2ML IX SOSY
20.0000 mg | PREFILLED_SYRINGE | INTRA_ARTICULAR | Status: AC | PRN
  Administered 2017-08-25: 20 mg via INTRA_ARTICULAR

## 2017-08-25 NOTE — Progress Notes (Signed)
Office Visit Note   Patient: Ellen Bowers           Date of Birth: 1945-11-28           MRN: [id number] Visit Date: 08/25/2017              Requested by: [name], MD [address] [place], [place] [address] PCP: [name], MD   Assessment & Plan: Visit Diagnoses:  1. Unilateral primary osteoarthritis, right knee     Plan: Second Euflexxa injection right knee.  Follow-up 1 week for third injection  Follow-Up Instructions: Return in about 1 week (around 09/01/2017).   Orders:  Orders Placed This Encounter  Procedures  . Large Joint Inj: R knee   No orders of the defined types were placed in this encounter.     Procedures: Large Joint Inj: R knee on 08/25/2017 11:32 AM Indications: pain and joint swelling Details: 25 G 1.5 in needle  Arthrogram: No  Medications: 20 mg Sodium Hyaluronate 20 MG/2ML Outcome: tolerated well, no immediate complications Procedure, treatment alternatives, risks and benefits explained, specific risks discussed. Consent was given by the patient. Immediately prior to procedure a time out was called to verify the correct patient, procedure, equipment, support staff and site/side marked as required. Patient was prepped and draped in the usual sterile fashion.       Clinical Data: No additional findings.   Subjective: Chief Complaint  Patient presents with  . Follow-up    #2 EUFLEXXA R KNEE  Not much change in osteoarthritic symptoms right knee after first injection  HPI  Review of Systems  Constitutional: Negative for fatigue and fever.  HENT: Negative for ear pain.   Eyes: Negative for pain.  Respiratory: Negative for cough and shortness of breath.   Cardiovascular: Negative for leg swelling.  Gastrointestinal: Negative for constipation and diarrhea.  Genitourinary: Negative for difficulty urinating.  Musculoskeletal: Negative for back pain and neck pain.  Skin: Negative for rash.  Allergic/Immunologic: Negative for food  allergies.  Neurological: Positive for weakness.  Hematological: Does not bruise/bleed easily.  Psychiatric/Behavioral: Positive for sleep disturbance.     Objective: Vital Signs: BP 112/66 (BP Location: Left Arm, Patient Position: Sitting, Cuff Size: Normal)   Pulse 86   Ht 5\' 9"  (1.753 m)   Wt 200 lb (90.7 kg)   BMI 29.53 kg/m   Physical Exam  Ortho Exam no problems related to first Euflexxa injection right knee.  Knee was not hot red warm or swollen.  Specialty Comments:  No specialty comments available.  Imaging: No results found.   PMFS History: Patient Active Problem List   Diagnosis Date Noted  . Unilateral primary osteoarthritis, right knee 08/18/2017  . Lung cancer, lower lobe ([place]) 02/08/2017  . Dyspepsia   . COPD with acute exacerbation ([place]) 06/30/2016  . Anorectal fissure 10/24/2014  . History of colon polyps 10/24/2014  . History of pneumonia 06/07/2014  . COPD mixed type ([place]) 06/07/2014  . Hemorrhoids 05/07/2014  . GERD (gastroesophageal reflux disease) 09/06/2013  . Chronic cough 09/06/2013   Past Medical History:  Diagnosis Date  . Anxiety   . Arthritis    R knee, bilateral hips, hands & neck- being given steroid injections   . Asthma   . Blood dyscrasia   . Cancer (HCC)    L hand  . COPD (chronic obstructive pulmonary disease) ([place])   . Depression   . Dyspnea   . GERD (gastroesophageal reflux disease)   . Helicobacter  pylori gastritis 2005  . Hyperlipidemia   . 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 Von Willebrand disease ([place]) 1996    Family History  Problem Relation Age of Onset  . Colon cancer Mother        Less than age 61  . Colon cancer Father        More than age 56, died from metastatic disease at age 8  . Liver disease Neg Hx     Past Surgical History:  Procedure Laterality Date  . BIOPSY  12/08/2016   Procedure: BIOPSY;  Surgeon: Danie Binder, MD;  [facility];  Service: Endoscopy;;  . BRAVO  [place] STUDY N/A 12/08/2016   Procedure: BRAVO [place]; Bravo Capsule Placement;  Surgeon: Danie Binder, MD;  [facility];  Service: Endoscopy;  Laterality: N/A;  . BREAST LUMPECTOMY Left    benign  . CATARACT EXTRACTION     double  . COLONOSCOPY N/A 05/30/2012   Dr.Mann:Multiple polyps removed from the right colon-see description above; otherwise normal colonoscopy up to the cecum. 5 polyps, multiple tubular adenomas. next tcs 05/2015  . COLONOSCOPY N/A 12/13/2015   Procedure: COLONOSCOPY;  Surgeon: Danie Binder, MD;  [facility];  Service: Endoscopy;  Laterality: N/A;  8:30 am  . ESOPHAGOGASTRODUODENOSCOPY  2012   Dr. Collene Mares: few erosions in duodenal bulb. BRAVO off PPI ("severe reflux")  . ESOPHAGOGASTRODUODENOSCOPY N/A 06/12/2014   SLF: 1. The mucosa of the esophagus appeared normal 2. Mild non-erosive gastrtitis.   [name] ESOPHAGOGASTRODUODENOSCOPY (EGD) WITH PROPOFOL N/A 12/08/2016   Procedure: ESOPHAGOGASTRODUODENOSCOPY (EGD) WITH PROPOFOL;  Surgeon: Danie Binder, MD;  [facility];  Service: Endoscopy;  Laterality: N/A;  7:30am  . EYE SURGERY Bilateral    L& R  . FLEXIBLE SIGMOIDOSCOPY N/A 02/05/2014   Dr. Oneida Alar: 5, 2-3 mm sessile sigmoid colon polyps removed, small internal hemorrhoids, moderate external hemorrhoids, 2 anal fissures present. Polyps were hyperplastic.   [name] NECK SURGERY  1951   cyst removal  . NECK SURGERY  1998   growth removed  . THORACOTOMY Right 02/08/2017   Procedure: THORACOTOMY MAJOR;  Surgeon: Gaye Pollack, MD;  Location[facility] OR;  Service: Thoracic;  Laterality: Right;  . TONSILLECTOMY  1951  . TUBAL LIGATION  1984  . VIDEO ASSISTED THORACOSCOPY (VATS)/WEDGE RESECTION Right 02/08/2017   Procedure: VIDEO ASSISTED THORACOSCOPY (VATS)/WEDGE RESECTION;  Surgeon: Gaye Pollack, MD;  Location: [facility];  Service: Thoracic;  Laterality: Right;   Social History   Occupational History  . Not on file  Tobacco Use  . Smoking status:  Former Smoker    Packs/day: 1.00    Years: 50.00    Pack years: 50.00    Types: Cigarettes    Start date: 03/17/1961    Last attempt to quit: 05/30/2009    Years since quitting: 8.2  . Smokeless tobacco: Never Used  . Tobacco comment: Quit smoking x 7 years  Substance and Sexual Activity  . Alcohol use: Yes    Comment: Occasionally    . Drug use: No  . Sexual activity: Not on file

## 2017-08-25 NOTE — Telephone Encounter (Signed)
PLEASE CALL PT. IN THE PAST DICLOFENAC AND CELEBREX HAVE BEEN LISTED AS HER CURRENT MEDICATIONS. SHE CAN TAKE DUEXIS. SHE SHOULD ONLY TAKE ONE OF THESE AT A TIME BECAUSE THEY CAN CAUSE KIDNEY FAILURE AND ULCERS. CONTINUE PROTONIX BID TO PREVENT ULCERS.

## 2017-08-25 NOTE — Telephone Encounter (Signed)
Pt said her orthopedic physician would like to start her on a new medication, but wanted her to check with Dr. Oneida Alar before starting it. Pt is already on Protonix and the new medication is DUEXIS which is a combination of Ibuprofen and Famotidine.  Dr. Oneida Alar, please advise!

## 2017-08-25 NOTE — Telephone Encounter (Signed)
REVIEWED.  

## 2017-08-26 NOTE — Telephone Encounter (Signed)
LMOM to call.

## 2017-08-26 NOTE — Telephone Encounter (Signed)
Pt is aware she may take the Duexis but cannot take the Diclofenac or celebrex at same time because it can cause kidney failure.   She said she has not been taking the Diclofenac and was going to quit the celebrex when she starts the Duexis.  She will continue the Protonix bid.

## 2017-09-01 ENCOUNTER — Ambulatory Visit (INDEPENDENT_AMBULATORY_CARE_PROVIDER_SITE_OTHER): Payer: Medicare Other | Admitting: Orthopaedic Surgery

## 2017-09-01 ENCOUNTER — Encounter (INDEPENDENT_AMBULATORY_CARE_PROVIDER_SITE_OTHER): Payer: Self-pay | Admitting: Orthopaedic Surgery

## 2017-09-01 VITALS — BP 108/74 | HR 76 | Ht 69.0 in | Wt 203.0 lb

## 2017-09-01 DIAGNOSIS — M1711 Unilateral primary osteoarthritis, right knee: Secondary | ICD-10-CM

## 2017-09-01 MED ORDER — SODIUM HYALURONATE (VISCOSUP) 20 MG/2ML IX SOSY
20.0000 mg | PREFILLED_SYRINGE | INTRA_ARTICULAR | Status: AC | PRN
  Administered 2017-09-01: 20 mg via INTRA_ARTICULAR

## 2017-09-01 NOTE — Progress Notes (Signed)
Office Visit Note   Patient: Ellen Bowers           Date of Birth: 11-01-1945           MRN: [id number] Visit Date: 09/01/2017              Requested by: [name], MD [address] [place], [place] [address] PCP: [name], MD   Assessment & Plan: Visit Diagnoses:  1. Unilateral primary osteoarthritis, right knee     Plan: Third Euflexxa injection right knee.  Follow-up as needed  Follow-Up Instructions: Return if symptoms worsen or fail to improve.   Orders:  No orders of the defined types were placed in this encounter.  No orders of the defined types were placed in this encounter.     Procedures: Large Joint Inj: R knee on 09/01/2017 9:42 AM Indications: pain and joint swelling Details: 25 G 1.5 in needle  Arthrogram: No  Medications: 20 mg Sodium Hyaluronate 20 MG/2ML Outcome: tolerated well, no immediate complications Procedure, treatment alternatives, risks and benefits explained, specific risks discussed. Consent was given by the patient. Immediately prior to procedure a time out was called to verify the correct patient, procedure, equipment, support staff and site/side marked as required. Patient was prepped and draped in the usual sterile fashion.       Clinical Data: No additional findings.   Subjective: Chief Complaint  Patient presents with  . Follow-up    EUFLEXXA KNEE INJECTION  2 prior Euflexxa injections made a difference.  We will proceed with the third.  We will also give her a prescription for Duexis  HPI  Review of Systems   Objective: Vital Signs: BP 108/74 (BP Location: Left Arm, Patient Position: Sitting, Cuff Size: Normal)   Pulse 76   Ht 5\' 9"  (1.753 m)   Wt 203 lb (92.1 kg)   BMI 29.98 kg/m   Physical Exam  Ortho Exam awake alert and oriented x3.  Comfortable sitting.  Right knee not effused.  Predominant lateral joint pain consistent with the arthritis more lateral than medial.  Specialty Comments:  No specialty  comments available.  Imaging: No results found.   PMFS History: Patient Active Problem List   Diagnosis Date Noted  . Unilateral primary osteoarthritis, right knee 08/18/2017  . Lung cancer, lower lobe ([place]) 02/08/2017  . Dyspepsia   . COPD with acute exacerbation ([place]) 06/30/2016  . Anorectal fissure 10/24/2014  . History of colon polyps 10/24/2014  . History of pneumonia 06/07/2014  . COPD mixed type ([place]) 06/07/2014  . Hemorrhoids 05/07/2014  . GERD (gastroesophageal reflux disease) 09/06/2013  . Chronic cough 09/06/2013   Past Medical History:  Diagnosis Date  . Anxiety   . Arthritis    R knee, bilateral hips, hands & neck- being given steroid injections   . Asthma   . Blood dyscrasia   . Cancer (HCC)    L hand  . COPD (chronic obstructive pulmonary disease) ([place])   . Depression   . Dyspnea   . GERD (gastroesophageal reflux disease)   . Helicobacter pylori gastritis 2005  . Hyperlipidemia   . 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 Von Willebrand disease ([place]) 1996    Family History  Problem Relation Age of Onset  . Colon cancer Mother        Less than age 31  . Colon cancer Father        More than age 71, died  from metastatic disease at age 55  . Liver disease Neg Hx     Past Surgical History:  Procedure Laterality Date  . BIOPSY  12/08/2016   Procedure: BIOPSY;  Surgeon: Danie Binder, MD;  [facility];  Service: Endoscopy;;  . BRAVO [place] STUDY N/A 12/08/2016   Procedure: BRAVO [place]; Bravo Capsule Placement;  Surgeon: Danie Binder, MD;  [facility];  Service: Endoscopy;  Laterality: N/A;  . BREAST LUMPECTOMY Left    benign  . CATARACT EXTRACTION     double  . COLONOSCOPY N/A 05/30/2012   Dr.Mann:Multiple polyps removed from the right colon-see description above; otherwise normal colonoscopy up to the cecum. 5 polyps, multiple tubular adenomas. next tcs 05/2015  . COLONOSCOPY N/A 12/13/2015   Procedure:  COLONOSCOPY;  Surgeon: Danie Binder, MD;  [facility];  Service: Endoscopy;  Laterality: N/A;  8:30 am  . ESOPHAGOGASTRODUODENOSCOPY  2012   Dr. Collene Mares: few erosions in duodenal bulb. BRAVO off PPI ("severe reflux")  . ESOPHAGOGASTRODUODENOSCOPY N/A 06/12/2014   SLF: 1. The mucosa of the esophagus appeared normal 2. Mild non-erosive gastrtitis.   [name] ESOPHAGOGASTRODUODENOSCOPY (EGD) WITH PROPOFOL N/A 12/08/2016   Procedure: ESOPHAGOGASTRODUODENOSCOPY (EGD) WITH PROPOFOL;  Surgeon: Danie Binder, MD;  [facility];  Service: Endoscopy;  Laterality: N/A;  7:30am  . EYE SURGERY Bilateral    L& R  . FLEXIBLE SIGMOIDOSCOPY N/A 02/05/2014   Dr. Oneida Alar: 5, 2-3 mm sessile sigmoid colon polyps removed, small internal hemorrhoids, moderate external hemorrhoids, 2 anal fissures present. Polyps were hyperplastic.   [name] NECK SURGERY  1951   cyst removal  . NECK SURGERY  1998   growth removed  . THORACOTOMY Right 02/08/2017   Procedure: THORACOTOMY MAJOR;  Surgeon: Gaye Pollack, MD;  Location: [facility] OR;  Service: Thoracic;  Laterality: Right;  . TONSILLECTOMY  1951  . TUBAL LIGATION  1984  . VIDEO ASSISTED THORACOSCOPY (VATS)/WEDGE RESECTION Right 02/08/2017   Procedure: VIDEO ASSISTED THORACOSCOPY (VATS)/WEDGE RESECTION;  Surgeon: Gaye Pollack, MD;  Location: [facility] OR;  Service: Thoracic;  Laterality: Right;   Social History   Occupational History  . Not on file  Tobacco Use  . Smoking status: Former Smoker    Packs/day: 1.00    Years: 50.00    Pack years: 50.00    Types: Cigarettes    Start date: 03/17/1961    Last attempt to quit: 05/30/2009    Years since quitting: 8.2  . Smokeless tobacco: Never Used  . Tobacco comment: Quit smoking x 7 years  Substance and Sexual Activity  . Alcohol use: Yes    Comment: Occasionally    . Drug use: No  . Sexual activity: Not on file     Garald Balding, MD   Note - This record has been created using [employer].  Chart  creation errors have been sought, but may not always  have been located. Such creation errors do not reflect on  the standard of medical care. +

## 2017-09-02 ENCOUNTER — Other Ambulatory Visit: Payer: Self-pay | Admitting: *Deleted

## 2017-09-02 DIAGNOSIS — R911 Solitary pulmonary nodule: Secondary | ICD-10-CM

## 2017-09-08 ENCOUNTER — Telehealth (INDEPENDENT_AMBULATORY_CARE_PROVIDER_SITE_OTHER): Payer: Self-pay | Admitting: Orthopaedic Surgery

## 2017-09-08 ENCOUNTER — Encounter: Payer: Self-pay | Admitting: Gastroenterology

## 2017-09-08 ENCOUNTER — Ambulatory Visit: Payer: Medicare Other | Admitting: Gastroenterology

## 2017-09-08 DIAGNOSIS — K219 Gastro-esophageal reflux disease without esophagitis: Secondary | ICD-10-CM

## 2017-09-08 DIAGNOSIS — K602 Anal fissure, unspecified: Secondary | ICD-10-CM

## 2017-09-08 NOTE — Assessment & Plan Note (Signed)
SYMPTOMS CONTROLLED/RESOLVED.  CONTINUE TO MONITOR SYMPTOMS. 

## 2017-09-08 NOTE — Patient Instructions (Addendum)
DRINK WATER TO KEEP YOUR URINE LIGHT YELLOW.  TO BETTER CONTROL REFLUX/REGUURGITATION:     AVOID REFLUX TRIGGERS. SEE INFO BELOW.    EAT 4-6 SMALL MEALS A DAY.    LOSE 20 LBS.    CONTINUE PROTONIX. TAKE 30 MINUTES PRIOR TO MEALS TWICE DAILY.    ZANTAC HELPS MOST WHEN USED AS NEEDED.    USE MAALOX AS NEEDED.   PLEASE CALL IF YOU WOULD LIKE A REFERRAL TO SEE THE SURGEON FOR A NISSEN FUNDOPLICATION.  FOLLOW UP IN 6 MOS.       Lifestyle and home remedies TO MANAGE REFLUX/REGURGITATION  You may eliminate or reduce the frequency of heartburn by making the following lifestyle changes:  . Control your weight. Being overweight is a major risk factor for heartburn and GERD. Excess pounds put pressure on your abdomen, pushing up your stomach and causing acid to back up into your esophagus.   . Eat smaller meals. 4 TO 6 MEALS A DAY. This reduces pressure on the lower esophageal sphincter, helping to prevent the valve from opening and acid from washing back into your esophagus.   Dolphus Jenny your belt. Clothes that fit tightly around your waist put pressure on your abdomen and the lower esophageal sphincter.   . Eliminate heartburn triggers. Everyone has specific triggers. Common triggers such as fatty or fried foods, spicy food, tomato sauce, carbonated beverages, alcohol, chocolate, mint, garlic, onion, caffeine and nicotine may make heartburn worse.   [name] Avoid stooping or bending. Tying your shoes is OK. Bending over for longer periods to weed your garden isn't, especially soon after eating.   . Don't lie down after a meal. Wait at least three to four hours after eating before going to bed, and don't lie down right after eating.     Alternative medicine . Several home remedies exist for treating GERD, but they provide only temporary relief. They include drinking baking soda (sodium bicarbonate) added to water or drinking other fluids such as baking soda mixed with cream of tartar and  water.  . Although these liquids create temporary relief by neutralizing, washing away or buffering acids, eventually they aggravate the situation by adding gas and fluid to your stomach, increasing pressure and causing more acid reflux. Further, adding more sodium to your diet may increase your blood pressure and add stress to your heart, and excessive bicarbonate ingestion can alter the acid-base balance in your body.  Low-Fat Diet BREADS, CEREALS, PASTA, RICE, DRIED PEAS, AND BEANS These products are high in carbohydrates and most are low in fat. Therefore, they can be increased in the diet as substitutes for fatty foods. They too, however, contain calories and should not be eaten in excess. Cereals can be eaten for snacks as well as for breakfast.   FRUITS AND VEGETABLES It is good to eat fruits and vegetables. Besides being sources of fiber, both are rich in vitamins and some minerals. They help you get the daily allowances of these nutrients. Fruits and vegetables can be used for snacks and desserts.  MEATS Limit lean meat, chicken, [place], and fish to no more than 6 ounces per day. Beef, Pork, and Lamb Use lean cuts of beef, pork, and lamb. Lean cuts include:  Extra-lean ground beef.  Arm roast.  Sirloin tip.  Center-cut ham.  Round steak.  Loin chops.  Rump roast.  Tenderloin.  Trim all fat off the outside of meats before cooking. It is not necessary to severely decrease the intake of red  meat, but lean choices should be made. Lean meat is rich in protein and contains a highly absorbable form of iron. Premenopausal women, in particular, should avoid reducing lean red meat because this could increase the risk for low red blood cells (iron-deficiency anemia).  Chicken and [place] These are good sources of protein. The fat of poultry can be reduced by removing the skin and underlying fat layers before cooking. Chicken and [place] can be substituted for lean red meat in the diet. Poultry  should not be fried or covered with high-fat sauces. Fish and Shellfish Fish is a good source of protein. Shellfish contain cholesterol, but they usually are low in saturated fatty acids. The preparation of fish is important. Like chicken and [place], they should not be fried or covered with high-fat sauces. EGGS Egg whites contain no fat or cholesterol. They can be eaten often. Try 1 to 2 egg whites instead of whole eggs in recipes or use egg substitutes that do not contain yolk. MILK AND DAIRY PRODUCTS Use skim or 1% milk instead of 2% or whole milk. Decrease whole milk, natural, and processed cheeses. Use nonfat or low-fat (2%) cottage cheese or low-fat cheeses made from vegetable oils. Choose nonfat or low-fat (1 to 2%) yogurt. Experiment with evaporated skim milk in recipes that call for heavy cream. Substitute low-fat yogurt or low-fat cottage cheese for sour cream in dips and salad dressings. Have at least 2 servings of low-fat dairy products, such as 2 glasses of skim (or 1%) milk each day to help get your daily calcium intake. FATS AND OILS Reduce the total intake of fats, especially saturated fat. Butterfat, lard, and beef fats are high in saturated fat and cholesterol. These should be avoided as much as possible. Vegetable fats do not contain cholesterol, but certain vegetable fats, such as coconut oil, palm oil, and palm kernel oil are very high in saturated fats. These should be limited. These fats are often used in bakery goods, processed foods, popcorn, oils, and nondairy creamers. Vegetable shortenings and some peanut butters contain hydrogenated oils, which are also saturated fats. Read the labels on these foods and check for saturated vegetable oils. Unsaturated vegetable oils and fats do not raise blood cholesterol. However, they should be limited because they are fats and are high in calories. Total fat should still be limited to 30% of your daily caloric intake. Desirable liquid  vegetable oils are corn oil, cottonseed oil, olive oil, canola oil, safflower oil, soybean oil, and sunflower oil. Peanut oil is not as good, but small amounts are acceptable. Buy a heart-healthy tub margarine that has no partially hydrogenated oils in the ingredients. Mayonnaise and salad dressings often are made from unsaturated fats, but they should also be limited because of their high calorie and fat content. Seeds, nuts, peanut butter, olives, and avocados are high in fat, but the fat is mainly the unsaturated type. These foods should be limited mainly to avoid excess calories and fat. OTHER EATING TIPS Snacks  Most sweets should be limited as snacks. They tend to be rich in calories and fats, and their caloric content outweighs their nutritional value. Some good choices in snacks are graham crackers, melba toast, soda crackers, bagels (no egg), English muffins, fruits, and vegetables. These snacks are preferable to snack crackers, [place] fries, TORTILLA CHIPS, and POTATO chips. Popcorn should be air-popped or cooked in small amounts of liquid vegetable oil. Desserts Eat fruit, low-fat yogurt, and fruit ices instead of pastries, cake, and cookies.  Sherbet, angel food cake, gelatin dessert, frozen low-fat yogurt, or other frozen products that do not contain saturated fat (pure fruit juice bars, frozen ice pops) are also acceptable.  COOKING METHODS Choose those methods that use little or no fat. They include: Poaching.  Braising.  Steaming.  Grilling.  Baking.  Stir-frying.  Broiling.  Microwaving.  Foods can be cooked in a nonstick pan without added fat, or use a nonfat cooking spray in regular cookware. Limit fried foods and avoid frying in saturated fat. Add moisture to lean meats by using water, broth, cooking wines, and other nonfat or low-fat sauces along with the cooking methods mentioned above. Soups and stews should be chilled after cooking. The fat that forms on top after a few hours  in the refrigerator should be skimmed off. When preparing meals, avoid using excess salt. Salt can contribute to raising blood pressure in some people.  EATING AWAY FROM HOME Order entres, potatoes, and vegetables without sauces or butter. When meat exceeds the size of a deck of cards (3 to 4 ounces), the rest can be taken home for another meal. Choose vegetable or fruit salads and ask for low-calorie salad dressings to be served on the side. Use dressings sparingly. Limit high-fat toppings, such as bacon, crumbled eggs, cheese, sunflower seeds, and olives. Ask for heart-healthy tub margarine instead of butter.

## 2017-09-08 NOTE — Progress Notes (Signed)
ON RECALL  °

## 2017-09-08 NOTE — Telephone Encounter (Signed)
[facility]xis over $200. Patient is confused about medication. Patient only wants to take 1 pill of IBU 800 mg. Please advise.

## 2017-09-08 NOTE — Assessment & Plan Note (Addendum)
SYMPTOMS NOT CONTROLLED AND PT DOES NOT STRICTLY ADHERE TO [facility] RECOMMENDATIONS ALWAYS.  DRINK WATER TO KEEP YOUR URINE LIGHT YELLOW. TO BETTER CONTROL REFLUX/REGUURGITATION:    AVOID REFLUX TRIGGERS. SEE INFO BELOW.    EAT 4-6 SMALL MEALS A DAY.    LOSE 20 LBS.    CONTINUE PROTONIX. TAKE 30 MINUTES PRIOR TO MEALS TWICE DAILY.    ZANTAC HELPS MOST WHEN USED AS NEEDED.    USE MAALOX AS NEEDED. DECLINE SURGERY REFERRAL ASKED TO  CALL IF SHE WOULD LIKE A REFERRAL TO SEE THE SURGEON FOR A NISSEN FUNDOPLICATION.  FOLLOW UP IN 6 MOS.

## 2017-09-08 NOTE — Progress Notes (Signed)
CC'ED TO PCP 

## 2017-09-08 NOTE — Telephone Encounter (Signed)
Sam from [facility] left a voicemail stating they received the prescription for patient but it is coming up with a high copay and are wondering if they can split this medication up.  Please call back at [phone]

## 2017-09-08 NOTE — Progress Notes (Signed)
Subjective:    Patient ID: Ellen Bowers, female    DOB: [birth year], 72 y.o.   MRN: [id number]  [name], MD   HPI Can't sleep with head on blocks. Been in the recliner BECAUSE HUSBAND CAN'T SLEEP WITH HEAD ELEVATED. HEARTBURN: 20-30 MINS AFTER FULL MEAL SHE COUGHS. DOESN'T FEEL THINGS BUBBLES BACK UP. COUGHS UP PHLEGM. CAN'T SLEEP WITH A WEDGE BUT T MAKES HER HEMORRHOIDS FLARE AFTER A WEEK. BMs; may be kaki collared. MAY SEE that 3x/ a month. USES MAALOX BUT NOT EVERY DAY. PROTONIX HAS MADE THINGS  BETTER BUT SHE WOULD LIKE TO HAVE LESS SYMPTOMS THAN SHE HAS. SOB: WITH EXERTION AND LITTLE WORSE SINCE HER SURGERY. NEEDS A KNEE REPLACEMENT. STILL WORKING A COUPLE DAYS A WEEK: MAKES HOME VISIT FOR HOSPICE. RARE FEELING  OF THINS HUNG IN [place] BUT EATS A LITTLE SOMETHING LIKE A CRACKER AND IT GOES AWAY. CAN GIVE UP A LOT OF THINGS EXCEPT PIZZA. IF TAKES STOOL SOFTENERS AND EAT FRUIT SHE WILL HAVE CONSTIPATION.   PT DENIES FEVER, CHILLS, HEMATOCHEZIA, HEMATEMESIS, nausea, vomiting, melena, diarrhea, CHEST PAIN, CHANGE IN BOWEL IN HABITS, abdominal pain, OR problems swallowing.   Past Medical History:  Diagnosis Date  . Anxiety   . Arthritis    R knee, bilateral hips, hands & neck- being given steroid injections   . Asthma   . Blood dyscrasia   . Cancer (HCC)    L hand  . COPD (chronic obstructive pulmonary disease) ([place])   . Depression   . Dyspnea   . GERD (gastroesophageal reflux disease)   . Helicobacter pylori gastritis 2005  . Hyperlipidemia   . 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 Von Willebrand disease ([place]) 1996    Past Surgical History:  Procedure Laterality Date  . BIOPSY  12/08/2016   Procedure: BIOPSY;  Surgeon: Danie Binder, MD;  [facility];  Service: Endoscopy;;  . BRAVO [place] STUDY N/A 12/08/2016   Procedure: BRAVO [place]; Bravo Capsule Placement;  Surgeon: Danie Binder, MD;  [facility];  Service: Endoscopy;   Laterality: N/A;  . BREAST LUMPECTOMY Left    benign  . CATARACT EXTRACTION     double  . COLONOSCOPY N/A 05/30/2012   Dr.Mann:Multiple polyps removed from the right colon-see description above; otherwise normal colonoscopy up to the cecum. 5 polyps, multiple tubular adenomas. next tcs 05/2015  . COLONOSCOPY N/A 12/13/2015   Procedure: COLONOSCOPY;  Surgeon: Danie Binder, MD;  [facility];  Service: Endoscopy;  Laterality: N/A;  8:30 am  . ESOPHAGOGASTRODUODENOSCOPY  2012   Dr. Collene Mares: few erosions in duodenal bulb. BRAVO off PPI ("severe reflux")  . ESOPHAGOGASTRODUODENOSCOPY N/A 06/12/2014   SLF: 1. The mucosa of the esophagus appeared normal 2. Mild non-erosive gastrtitis.   [name] ESOPHAGOGASTRODUODENOSCOPY (EGD) WITH PROPOFOL N/A 12/08/2016   Procedure: ESOPHAGOGASTRODUODENOSCOPY (EGD) WITH PROPOFOL;  Surgeon: Danie Binder, MD;  [facility];  Service: Endoscopy;  Laterality: N/A;  7:30am  . EYE SURGERY Bilateral    L& R  . FLEXIBLE SIGMOIDOSCOPY N/A 02/05/2014   Dr. Oneida Alar: 5, 2-3 mm sessile sigmoid colon polyps removed, small internal hemorrhoids, moderate external hemorrhoids, 2 anal fissures present. Polyps were hyperplastic.   [name] NECK SURGERY  1951   cyst removal  . NECK SURGERY  1998   growth removed  . THORACOTOMY Right 02/08/2017   Procedure: THORACOTOMY MAJOR;  Surgeon: Gaye Pollack, MD;  Location: [facility];  Service: Thoracic;  Laterality: Right;  . TONSILLECTOMY  1951  . TUBAL LIGATION  1984  . VIDEO ASSISTED THORACOSCOPY (VATS)/WEDGE RESECTION Right 02/08/2017   Procedure: VIDEO ASSISTED THORACOSCOPY (VATS)/WEDGE RESECTION;  Surgeon: Gaye Pollack, MD;  Location: [facility];  Service: Thoracic;  Laterality: Right;   Allergies  Allergen Reactions  . Demerol [Meperidine] Nausea And Vomiting  . Betadine [Povidone Iodine] Rash    Topical only  . Povidone-Iodine Rash    Topical only   Current Outpatient Medications  Medication Sig    . acetaminophen  (TYLENOL) 500 MG tablet Take 1,000 mg by mouth 2 (two) times daily as needed for moderate pain or headache.    . albuterol (PROVENTIL HFA;VENTOLIN HFA) 108 (90 Base) MCG/ACT inhaler Inhale 2 puffs into the lungs every 6 (six) hours as needed for wheezing or shortness of breath.    [name] alum & mag hydroxide-simeth (MAALOX/MYLANTA) 200-200-20 MG/5ML suspension Take 15 mLs by mouth every 6 (six) hours as needed for indigestion or heartburn.     . Ascorbic Acid (VITAMIN C) 1000 MG tablet Take 1,000 mg by mouth daily.     [name] azelastine (ASTELIN) 0.1 % nasal spray Place 2 sprays into both nostrils daily as needed for rhinitis. Use in each nostril as directed    . b complex vitamins tablet Take 1 tablet by mouth daily.    . budesonide (PULMICORT) 0.5 MG/2ML nebulizer solution Take 0.5 mg by nebulization as needed (for shortness of breath/wheezing.).     [name] buPROPion (WELLBUTRIN SR) 200 MG 12 hr tablet Take 200 mg by mouth 2 (two) times daily.    . Ca Carbonate-Mag Hydroxide (ROLAIDS PO) Take 2 tablets by mouth as needed.    . Calcium-Magnesium (CAL-MAG PO) Take 1 tablet by mouth at bedtime.    . carboxymethylcellulose (REFRESH PLUS) 0.5 % SOLN Place 1-2 drops into both eyes 2 (two) times daily as needed (for dry eyes). REFRESH    . cetirizine (ZYRTEC) 10 MG tablet Take 10 mg by mouth daily.    . Cholecalciferol (VITAMIN D PO) Take by mouth daily. Unsure of dose    . docusate sodium (COLACE) 100 MG capsule Take 100 mg by mouth 2 (two) times daily.     [name] doxylamine, Sleep, (UNISOM) 25 MG tablet Take 25 mg by mouth at bedtime.     . fluticasone (FLONASE) 50 MCG/ACT nasal spray Place 2 sprays into both nostrils daily as needed for allergies or rhinitis.    . Fluticasone-Salmeterol (ADVAIR) 500-50 MCG/DOSE AEPB Inhale 1 puff into the lungs 2 (two) times daily.     [name] HYDROcodone-homatropine (HYCODAN) 5-1.5 MG/5ML syrup TAKE 1 TEASPOONFUL (5ML) BY MOUTH EVERY 6 HOURS AS NEEDED    . Ibuprofen-Famotidine (DUEXIS)  800-26.6 MG TABS Take 1 tablet by mouth 3 (three) times daily.    [name] ipratropium-albuterol (DUONEB) 0.5-2.5 (3) MG/3ML SOLN Take 3 mLs by nebulization 3 (three) times daily. (Patient taking differently: Take 3 mLs by nebulization 3 (three) times daily. Scheduled twice daily, may use additional dose as needed)    . Misc Natural Products (MIDNITE PO) Take 1 tablet by mouth at bedtime.    . montelukast (SINGULAIR) 10 MG tablet Take 10 mg by mouth daily.     . ondansetron (ZOFRAN) 4 MG tablet Take 1 tablet (4 mg total) by mouth every 8 (eight) hours as needed for nausea or vomiting.    . pantoprazole (PROTONIX) 40 MG tablet TAKE ONE TABLET BY MOUTH  TWICE DAILY - 30 MINUTES BEFORE BREAKFAST & DINNER    . pravastatin (PRAVACHOL) 20 MG tablet Take 20 mg by mouth at bedtime.     . ranitidine (ZANTAC) 150 MG tablet Take 150 mg at bedtime as needed by mouth for heartburn.    . solifenacin (VESICARE) 5 MG tablet Take 5 mg by mouth at bedtime.     . Zoledronic Acid (RECLAST IV) Inject into the vein. Once per year    . celecoxib (CELEBREX) 200 MG capsule Take 200 mg by mouth daily.     . diclofenac (FLECTOR) 1.3 % PTCH Place 0.25 patches onto the skin as needed. 1/4TH OF PATCH DAILY     . ibuprofen (ADVIL,MOTRIN) 200 MG tablet Take 200 mg by mouth every 6 (six) hours as needed.      Review of Systems PER HPI OTHERWISE ALL SYSTEMS ARE NEGATIVE.    Objective:   Physical Exam  Constitutional: She is oriented to person, place, and time. She appears well-developed and well-nourished. No distress.  HENT:  Head: Normocephalic and atraumatic.  Mouth/Throat: Oropharynx is clear and moist. No oropharyngeal exudate.  Eyes: Pupils are equal, round, and reactive to light. No scleral icterus.  Neck: Normal range of motion. Neck supple.  Cardiovascular: Normal rate, regular rhythm and normal heart sounds.  Pulmonary/Chest: Effort normal and breath sounds normal. No respiratory distress.  Abdominal: Soft. Bowel sounds  are normal. She exhibits no distension. There is no tenderness.  Musculoskeletal: She exhibits no edema.  Lymphadenopathy:    She has no cervical adenopathy.  Neurological: She is alert and oriented to person, place, and time.  NO  NEW FOCAL DEFICITS  Psychiatric:  SLIGHTLY ANXIOUS MOOD, NL AFFECT  Vitals reviewed.     Assessment & Plan:

## 2017-09-09 ENCOUNTER — Other Ambulatory Visit (INDEPENDENT_AMBULATORY_CARE_PROVIDER_SITE_OTHER): Payer: Self-pay | Admitting: Radiology

## 2017-09-09 MED ORDER — IBUPROFEN 800 MG PO TABS: ORAL_TABLET | ORAL | 0 refills | Status: DC

## 2017-09-09 MED ORDER — FAMOTIDINE 20 MG PO TABS: ORAL_TABLET | ORAL | 0 refills | Status: DC

## 2017-09-09 NOTE — Telephone Encounter (Signed)
Sent meds in and notified pt 

## 2017-09-09 NOTE — Telephone Encounter (Signed)
yes

## 2017-09-09 NOTE — Telephone Encounter (Signed)
Can you please call patient and explain? Thank you.

## 2017-09-09 NOTE — Telephone Encounter (Signed)
Not sure what the problem is...can   Give her a Rx for ibuprofen if that's all she needs-please call

## 2017-09-09 NOTE — Telephone Encounter (Signed)
Pt would like to have ibuprofen 800 mg called in and an rx for pepcid 20 mg so insurance will pay for it. Can we call in ibuprofen and the pepcis. Please advise

## 2017-09-22 ENCOUNTER — Other Ambulatory Visit: Payer: Self-pay

## 2017-09-22 ENCOUNTER — Ambulatory Visit
Admission: RE | Admit: 2017-09-22 | Discharge: 2017-09-22 | Disposition: A | Discharge status: Home / Self Care | Payer: Medicare Other | Source: Ambulatory Visit | Attending: Surgery | Admitting: Surgery

## 2017-09-22 ENCOUNTER — Ambulatory Visit: Payer: Medicare Other | Admitting: Surgery

## 2017-09-22 ENCOUNTER — Encounter: Payer: Self-pay | Admitting: Surgery

## 2017-09-22 VITALS — BP 155/78 | HR 70 | Resp 16 | Ht 69.0 in | Wt 205.0 lb

## 2017-09-22 DIAGNOSIS — R911 Solitary pulmonary nodule: Secondary | ICD-10-CM

## 2017-09-22 DIAGNOSIS — R918 Other nonspecific abnormal finding of lung field: Secondary | ICD-10-CM

## 2017-09-22 DIAGNOSIS — C3431 Malignant neoplasm of lower lobe, right bronchus or lung: Secondary | ICD-10-CM | POA: Diagnosis not present

## 2017-09-26 ENCOUNTER — Encounter: Payer: Self-pay | Admitting: Surgery

## 2017-09-26 NOTE — Progress Notes (Signed)
HPI:  Patient returns for routine follow-up having undergoneright VATS and muscle sparing thoracotomy for wedge resection of a right lower lobe squamous cell carcinomaon 02/08/2017.The final pathology showed a T1b moderately differentiated invasive squamous cell carcinoma measuring 1.8 cm with negative resection margins.  She has been feeling well overall but still reports some exertional shortness of breath.  She has significant underlying COPD. She currently has a bad cold. She had some discomfort along the distribution of the intercostal nerve beneath the right breast but that has improved.  Current Outpatient Medications  Medication Sig Dispense Refill  . acetaminophen (TYLENOL) 500 MG tablet Take 1,000 mg by mouth 2 (two) times daily as needed for moderate pain or headache.    . albuterol (PROVENTIL HFA;VENTOLIN HFA) 108 (90 Base) MCG/ACT inhaler Inhale 2 puffs into the lungs every 6 (six) hours as needed for wheezing or shortness of breath.    [name] alum & mag hydroxide-simeth (MAALOX/MYLANTA) 200-200-20 MG/5ML suspension Take 15 mLs by mouth every 6 (six) hours as needed for indigestion or heartburn.     . Ascorbic Acid (VITAMIN C) 1000 MG tablet Take 1,000 mg by mouth daily.     [name] azelastine (ASTELIN) 0.1 % nasal spray Place 2 sprays into both nostrils daily as needed for rhinitis. Use in each nostril as directed    . b complex vitamins tablet Take 1 tablet by mouth daily.    . budesonide (PULMICORT) 0.5 MG/2ML nebulizer solution Take 0.5 mg by nebulization as needed (for shortness of breath/wheezing.).     [name] buPROPion (WELLBUTRIN SR) 200 MG 12 hr tablet Take 200 mg by mouth 2 (two) times daily.    . Calcium-Magnesium (CAL-MAG PO) Take 1 tablet by mouth at bedtime.    . carboxymethylcellulose (REFRESH PLUS) 0.5 % SOLN Place 1-2 drops into both eyes 2 (two) times daily as needed (for dry eyes). REFRESH    . celecoxib (CELEBREX) 200 MG capsule Take 200 mg by mouth daily.     .  cetirizine (ZYRTEC) 10 MG tablet Take 10 mg by mouth daily.    . Cholecalciferol (VITAMIN D PO) Take by mouth daily. Unsure of dose    . diclofenac (FLECTOR) 1.3 % PTCH Place 0.25 patches onto the skin as needed. 1/4TH OF PATCH DAILY     . docusate sodium (COLACE) 100 MG capsule Take 100 mg by mouth 2 (two) times daily.     [name] doxylamine, Sleep, (UNISOM) 25 MG tablet Take 25 mg by mouth at bedtime.     . fluticasone (FLONASE) 50 MCG/ACT nasal spray Place 2 sprays into both nostrils daily as needed for allergies or rhinitis.    . Fluticasone-Salmeterol (ADVAIR) 500-50 MCG/DOSE AEPB Inhale 1 puff into the lungs 2 (two) times daily.     [name] HYDROcodone-homatropine (HYCODAN) 5-1.5 MG/5ML syrup TAKE 1 TEASPOONFUL (5ML) BY MOUTH EVERY 6 HOURS AS NEEDED 473 mL 0  . ipratropium-albuterol (DUONEB) 0.5-2.5 (3) MG/3ML SOLN Take 3 mLs by nebulization 3 (three) times daily. (Patient taking differently: Take 3 mLs by nebulization 3 (three) times daily. Scheduled twice daily, may use additional dose as needed) 360 mL 11  . Misc Natural Products (MIDNITE PO) Take 1 tablet by mouth at bedtime.    . montelukast (SINGULAIR) 10 MG tablet Take 10 mg by mouth daily.     . ondansetron (ZOFRAN) 4 MG tablet Take 1 tablet (4 mg total) by mouth every 8 (eight) hours as needed for nausea or vomiting. 20 tablet 0  .  pantoprazole (PROTONIX) 40 MG tablet TAKE ONE TABLET BY MOUTH TWICE DAILY - 30 MINUTES BEFORE BREAKFAST & DINNER 60 tablet 6  . pravastatin (PRAVACHOL) 20 MG tablet Take 20 mg by mouth at bedtime.     . ranitidine (ZANTAC) 150 MG tablet Take 150 mg at bedtime as needed by mouth for heartburn.    . solifenacin (VESICARE) 5 MG tablet Take 5 mg by mouth at bedtime.     . Zoledronic Acid (RECLAST IV) Inject into the vein. Once per year     No current facility-administered medications for this visit.      Physical Exam: BP (!) 155/78 (BP Location: Right Arm, Patient Position: Sitting, Cuff Size: Large)   Pulse 70    Resp 16   Ht 5\' 9"  (1.753 m)   Wt 205 lb (93 kg)   SpO2 95% Comment: ON RA  BMI 30.27 kg/m  She looks like she does not feel well with her cold with a rattling cough. There is no cervical or supraclavicular adenopathy. Lungs are clear The right chest scars look fine. Cardiac exam shows a regular rate and rhythm with normal heart sounds.   Diagnostic Tests:  CLINICAL DATA:  Restaging squamous cell carcinoma of the right lung.  EXAM: CT CHEST WITHOUT CONTRAST  TECHNIQUE: Multidetector CT imaging of the chest was performed following the standard protocol without IV contrast.  COMPARISON:  Chest CT 11/24/2016 and PET-CT 11/27/2016  FINDINGS: Cardiovascular: The heart is normal in size and stable. No pericardial effusion. Stable mild tortuosity and calcification of the thoracic aorta. Stable scattered three-vessel coronary artery calcifications.  Mediastinum/Nodes: Stable mediastinal lymph nodes. 12.5 mm right paratracheal node is unchanged. 6 mm prevascular lymph node is unchanged. These were not hypermetabolic on the prior PET-CT. No new or progressive findings.  The esophagus is grossly normal.  Lungs/Pleura: Surgical changes from prior right lower lobe wedge resection. There are scarring changes along the suture line and also mild pleural thickening and a small loculated pleural fluid collection. I do not see any findings suspicious for residual or recurrent tumor.  Expected rib changes from the right-sided thoracotomy with healed rib fractures. There is also expected adjacent pleural thickening.  Stable advanced emphysematous changes and pulmonary scarring with biapical pleural and parenchymal changes. No new worrisome pulmonary lesions or pulmonary nodules suspicious for metastatic disease.  Upper Abdomen: No significant upper abdominal findings. No hepatic or adrenal gland lesions. Stable atherosclerotic calcifications involving the  aorta.  Musculoskeletal: No significant bony findings. No worrisome bone lesions.  IMPRESSION: 1. Expected postoperative changes from a prior right lower lobe wedge resection. I do not see any CT findings suspicious for residual or recurrent tumor. 2. Stable mediastinal lymph nodes.  No new or progressive findings. 3. Stable emphysematous changes and pulmonary scarring. No new worrisome pulmonary lesions or nodules.  Aortic Atherosclerosis (ICD10-I70.0) and Emphysema (ICD10-J43.9).   Electronically Signed   By: Marijo Sanes M.D.   On: 09/22/2017 10:59  Impression:  There is no radiographic evidence of recurrent lung cancer and no new nodules. Her preop PET scan did show a 9 mm irregular opacity in the posterior right upper lobe that was felt to most likely be a scar although it did have low level FDG uptake with an SUV max of 1.7. This opacity is still there are appears unchanged to me. I reviewed the current CT and prior PET images with her and her husband and answered their questions. I have recommended repeating the CT scan of  the chest in 6 months to follow up on the 9 mm irregular opacity in the posterior RUL which appears stable at this time.  Plan:  Return in 6 months with CT scan of the chest.   I spent 15 minutes performing this established patient evaluation and > 50% of this time was spent face to face counseling and coordinating the care of this patient's lung cancer surveillance.   Gaye Pollack, MD [facility] [phone]

## 2017-11-19 ENCOUNTER — Other Ambulatory Visit (INDEPENDENT_AMBULATORY_CARE_PROVIDER_SITE_OTHER): Payer: Self-pay | Admitting: Radiology

## 2017-11-19 MED ORDER — IBUPROFEN 800 MG PO TABS: ORAL_TABLET | ORAL | 1 refills | Status: DC

## 2017-11-22 ENCOUNTER — Encounter: Payer: Self-pay | Admitting: Pulmonary Disease

## 2017-11-22 ENCOUNTER — Ambulatory Visit: Payer: Medicare Other | Admitting: Pulmonary Disease

## 2017-11-22 VITALS — BP 136/82 | HR 76 | Temp 98.1°F | Ht 69.0 in | Wt 208.4 lb

## 2017-11-22 DIAGNOSIS — J449 Chronic obstructive pulmonary disease, unspecified: Secondary | ICD-10-CM | POA: Diagnosis not present

## 2017-11-22 DIAGNOSIS — C3431 Malignant neoplasm of lower lobe, right bronchus or lung: Secondary | ICD-10-CM

## 2017-11-22 DIAGNOSIS — Z8701 Personal history of pneumonia (recurrent): Secondary | ICD-10-CM | POA: Diagnosis not present

## 2017-11-22 DIAGNOSIS — Z902 Acquired absence of lung [part of]: Secondary | ICD-10-CM | POA: Diagnosis not present

## 2017-11-22 MED ORDER — HYDROCODONE-HOMATROPINE 5-1.5 MG/5ML PO SYRP: ORAL_SOLUTION | ORAL | 0 refills | Status: DC
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
## 2017-11-22 NOTE — Patient Instructions (Signed)
Today we updated your med list in our EPIC system...    Continue your current medications the same...  Today we reviewed your recent CXR & CT Chest from DrBartle...  We refilled your Hycodan per request...  Continue the NEBS w/ Duoneb & Budesonide, spaced out w/ your ADVAIR...  Keep up the good work w/ exercise/ daily activity/ and don't stop!  Ellen Bowers,  It has been my honor to have been one of your doctors over these past few years...    Sending you my very best wishes for good health & much happiness in the years to come!!!

## 2017-12-21 ENCOUNTER — Encounter: Payer: Self-pay | Admitting: Pulmonary Disease

## 2017-12-21 ENCOUNTER — Ambulatory Visit: Payer: Medicare Other | Admitting: Pulmonary Disease

## 2017-12-21 ENCOUNTER — Ambulatory Visit (INDEPENDENT_AMBULATORY_CARE_PROVIDER_SITE_OTHER): Payer: Medicare Other

## 2017-12-21 VITALS — BP 140/72 | HR 102 | Temp 98.2°F | Ht 69.0 in | Wt 206.2 lb

## 2017-12-21 DIAGNOSIS — Z902 Acquired absence of lung [part of]: Secondary | ICD-10-CM | POA: Diagnosis not present

## 2017-12-21 DIAGNOSIS — J441 Chronic obstructive pulmonary disease with (acute) exacerbation: Secondary | ICD-10-CM

## 2017-12-21 DIAGNOSIS — C3431 Malignant neoplasm of lower lobe, right bronchus or lung: Secondary | ICD-10-CM

## 2017-12-21 MED ORDER — AZITHROMYCIN 250 MG PO TABS: ORAL_TABLET | ORAL | 0 refills | Status: DC

## 2017-12-21 MED ORDER — METHYLPREDNISOLONE ACETATE 80 MG/ML IJ SUSP
120.0000 mg | Freq: Once | INTRAMUSCULAR | Status: AC
  Administered 2017-12-21: 120 mg via INTRAMUSCULAR

## 2017-12-21 MED ORDER — METHYLPREDNISOLONE 8 MG PO TABS: ORAL_TABLET | ORAL | 0 refills | Status: DC

## 2017-12-21 MED ORDER — HYDROCODONE-HOMATROPINE 5-1.5 MG/5ML PO SYRP: ORAL_SOLUTION | ORAL | 0 refills | Status: DC

## 2017-12-21 NOTE — Patient Instructions (Signed)
Today we updated your med list in our EPIC system...    Continue your current medications the same...  We wrote for an antibiotic-  ZPak take as directed on the pack...  We gave you a Depomedrol shot today...    Starting tomorrow take the MEDROL 8mg  tabs as follows>  Start w/ one tab twice daily for 1 week...  Then decrease to 1 tab each AM for 1 week...  Then decrease to 1/2 tab each AM for 1 week...  Then decrease to 1/2 tab every other day til gone...  We refilled your HYCODAN cough syrup as well for as needed use...  Call for any questions or if we can be of service in any way...   We will arrange for a follow up pulmonary eval w/ my young partner- Dr. Rodman Pickle.Marland KitchenMarland Kitchen

## 2017-12-29 ENCOUNTER — Ambulatory Visit (INDEPENDENT_AMBULATORY_CARE_PROVIDER_SITE_OTHER): Payer: Self-pay

## 2017-12-29 ENCOUNTER — Encounter (INDEPENDENT_AMBULATORY_CARE_PROVIDER_SITE_OTHER): Payer: Self-pay | Admitting: Orthopaedic Surgery

## 2017-12-29 ENCOUNTER — Ambulatory Visit (INDEPENDENT_AMBULATORY_CARE_PROVIDER_SITE_OTHER): Payer: Medicare Other | Admitting: Orthopaedic Surgery

## 2017-12-29 VITALS — BP 138/89 | HR 77 | Ht 69.0 in | Wt 202.0 lb

## 2017-12-29 DIAGNOSIS — M25561 Pain in right knee: Secondary | ICD-10-CM

## 2017-12-29 DIAGNOSIS — G8929 Other chronic pain: Secondary | ICD-10-CM

## 2017-12-29 DIAGNOSIS — M1711 Unilateral primary osteoarthritis, right knee: Secondary | ICD-10-CM

## 2017-12-29 MED ORDER — LIDOCAINE HCL 1 % IJ SOLN
2.0000 mL | INTRAMUSCULAR | Status: AC | PRN
  Administered 2017-12-29: 2 mL

## 2017-12-29 MED ORDER — METHYLPREDNISOLONE ACETATE 40 MG/ML IJ SUSP
80.0000 mg | INTRAMUSCULAR | Status: AC | PRN
  Administered 2017-12-29: 80 mg

## 2017-12-29 MED ORDER — BUPIVACAINE HCL 0.5 % IJ SOLN
2.0000 mL | INTRAMUSCULAR | Status: AC | PRN
  Administered 2017-12-29: 2 mL via INTRA_ARTICULAR

## 2017-12-29 NOTE — Progress Notes (Signed)
Office Visit Note   Patient: Ellen Bowers           Date of Birth: [birth year]           MRN: [id number] Visit Date: 12/29/2017              Requested by: [name], MD [id number] [place], [place] [address] PCP: [name], MD   Assessment & Plan: Visit Diagnoses:  1. Chronic pain of right knee   2. Unilateral primary osteoarthritis, right knee     Plan: Advanced osteoarthritis right knee particularly in the lateral compartment.  Will reinject with and a Depo-Medrol and monitor response.  Have had long discussion regarding knee replacement at some point in the future Follow-Up Instructions: Return if symptoms worsen or fail to improve.   Orders:  Orders Placed This Encounter  Procedures  . Large Joint Inj: R knee   No orders of the defined types were placed in this encounter.     Procedures: Large Joint Inj: R knee on 12/29/2017 8:46 AM Indications: pain and diagnostic evaluation Details: 25 G 1.5 in needle, anteromedial approach  Arthrogram: No  Medications: 2 mL lidocaine 1 %; 2 mL bupivacaine 0.5 %; 80 mg methylPREDNISolone acetate 40 MG/ML Procedure, treatment alternatives, risks and benefits explained, specific risks discussed. Consent was given by the patient. Immediately prior to procedure a time out was called to verify the correct patient, procedure, equipment, support staff and site/side marked as required. Patient was prepped and draped in the usual sterile fashion.       Clinical Data: No additional findings.   Subjective: Chief Complaint  Patient presents with  . Right Knee - Pain  . Knee Pain    on going knee , hurting on side rt and back of knee, advil 800mg  helpssome , uses some a gel with pain   Ellen Bowers is well-known to me.  She is 72 years old and has a history of near end-stage osteoarthritis of the right knee predominantly lateral compartment.  She has had cortisone injection in the past with good yet temporary relief.  She is not  ready for total knee replacement would like to consider another cortisone injection  HPI  Review of Systems  Cardiovascular: Positive for leg swelling.  Neurological: Positive for weakness.     Objective: Vital Signs: BP 138/89   Pulse 77   Ht 5\' 9"  (1.753 m)   Wt 202 lb (91.6 kg)   BMI 29.83 kg/m   Physical Exam Constitutional:      Appearance: She is well-developed.  Eyes:     Pupils: Pupils are equal, round, and reactive to light.  Pulmonary:     Effort: Pulmonary effort is normal.  Skin:    General: Skin is warm and dry.  Neurological:     Mental Status: She is alert and oriented to person, place, and time.  Psychiatric:        Behavior: Behavior normal.     Ortho Exam awake alert and oriented x3.  Comfortable sitting.  Not quite full extension right knee.  Small effusion.  Increased valgus with weightbearing with predominant lateral compartment pain.  Some patellar crepitation.  No swelling distally.  Motor exam intact  Specialty Comments:  No specialty comments available.  Imaging: No results found.   PMFS History: Patient Active Problem List   Diagnosis Date Noted  . Chronic pain of right knee 12/29/2017  . 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 Unilateral  primary osteoarthritis, right knee 08/18/2017  . Lung cancer, lower lobe ([place]) 02/08/2017  . Dyspepsia   . COPD with acute exacerbation ([place]) 06/30/2016  . Anorectal fissure 10/24/2014  . History of colon polyps 10/24/2014  . History of pneumonia 06/07/2014  . COPD mixed type ([place]) 06/07/2014  . Hemorrhoids 05/07/2014  . GERD (gastroesophageal reflux disease) 09/06/2013  . Chronic cough 09/06/2013   Past Medical History:  Diagnosis Date  . Anxiety   . Arthritis    R knee, bilateral hips, hands & neck- being given steroid injections   . Asthma   . Blood dyscrasia   . Cancer (HCC)    L hand  . COPD (chronic obstructive pulmonary disease) ([place])   . Depression   . Dyspnea   . GERD  (gastroesophageal reflux disease)   . Helicobacter pylori gastritis 2005  . Hyperlipidemia   . 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 Von Willebrand disease ([place]) 1996    Family History  Problem Relation Age of Onset  . Colon cancer Mother        Less than age 49  . Colon cancer Father        More than age 30, died from metastatic disease at age 50  . Liver disease Neg Hx     Past Surgical History:  Procedure Laterality Date  . BIOPSY  12/08/2016   Procedure: BIOPSY;  Surgeon: Danie Binder, MD;  Location: [facility];  Service: Endoscopy;;  . BRAVO [place] STUDY N/A 12/08/2016   Procedure: BRAVO [place]; Bravo Capsule Placement;  Surgeon: Danie Binder, MD;  Location: [facility];  Service: Endoscopy;  Laterality: N/A;  . BREAST LUMPECTOMY Left    benign  . CATARACT EXTRACTION     double  . COLONOSCOPY N/A 05/30/2012   Dr.Mann:Multiple polyps removed from the right colon-see description above; otherwise normal colonoscopy up to the cecum. 5 polyps, multiple tubular adenomas. next tcs 05/2015  . COLONOSCOPY N/A 12/13/2015   Procedure: COLONOSCOPY;  Surgeon: Danie Binder, MD;  [facility]: [facility];  Service: Endoscopy;  Laterality: N/A;  8:30 am  . ESOPHAGOGASTRODUODENOSCOPY  2012   Dr. Collene Mares: few erosions in duodenal bulb. BRAVO off PPI ("severe reflux")  . ESOPHAGOGASTRODUODENOSCOPY N/A 06/12/2014   SLF: 1. The mucosa of the esophagus appeared normal 2. Mild non-erosive gastrtitis.   [name] ESOPHAGOGASTRODUODENOSCOPY (EGD) WITH PROPOFOL N/A 12/08/2016   Procedure: ESOPHAGOGASTRODUODENOSCOPY (EGD) WITH PROPOFOL;  Surgeon: Danie Binder, MD;  [facility]: [facility];  Service: Endoscopy;  Laterality: N/A;  7:30am  . EYE SURGERY Bilateral    L& R  . FLEXIBLE SIGMOIDOSCOPY N/A 02/05/2014   Dr. Oneida Alar: 5, 2-3 mm sessile sigmoid colon polyps removed, small internal hemorrhoids, moderate external hemorrhoids, 2 anal fissures present. Polyps were hyperplastic.   [name] NECK  SURGERY  1951   cyst removal  . NECK SURGERY  1998   growth removed  . THORACOTOMY Right 02/08/2017   Procedure: THORACOTOMY MAJOR;  Surgeon: Gaye Pollack, MD;  Location: [facility];  Service: Thoracic;  Laterality: Right;  . TONSILLECTOMY  1951  . TUBAL LIGATION  1984  . VIDEO ASSISTED THORACOSCOPY (VATS)/WEDGE RESECTION Right 02/08/2017   Procedure: VIDEO ASSISTED THORACOSCOPY (VATS)/WEDGE RESECTION;  Surgeon: Gaye Pollack, MD;  Location: [facility] OR;  Service: Thoracic;  Laterality: Right;   Social History   Occupational History  . Not on file  Tobacco Use  . Smoking status: Former Smoker  Packs/day: 1.00    Years: 50.00    Pack years: 50.00    Types: Cigarettes    Start date: 03/17/1961    Last attempt to quit: 05/30/2009    Years since quitting: 8.5  . Smokeless tobacco: Never Used  . Tobacco comment: Quit smoking x 7 years  Substance and Sexual Activity  . Alcohol use: Yes    Comment: Occasionally    . Drug use: No  . Sexual activity: Not on file

## 2018-01-10 ENCOUNTER — Other Ambulatory Visit (INDEPENDENT_AMBULATORY_CARE_PROVIDER_SITE_OTHER): Payer: Self-pay | Admitting: Radiology

## 2018-01-10 MED ORDER — IBUPROFEN 800 MG PO TABS: ORAL_TABLET | ORAL | 1 refills | Status: DC

## 2018-01-21 ENCOUNTER — Encounter: Payer: Self-pay | Admitting: Pulmonary Disease
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
## 2018-01-26 ENCOUNTER — Encounter: Payer: Self-pay | Admitting: Gastroenterology

## 2018-01-27 ENCOUNTER — Telehealth: Payer: Self-pay | Admitting: Pulmonary Disease

## 2018-01-27 ENCOUNTER — Encounter: Payer: Self-pay | Admitting: Pulmonary Disease

## 2018-01-27 ENCOUNTER — Ambulatory Visit: Payer: Medicare Other | Admitting: Pulmonary Disease

## 2018-01-27 VITALS — BP 140/92 | HR 81 | Wt 207.6 lb

## 2018-01-27 DIAGNOSIS — J449 Chronic obstructive pulmonary disease, unspecified: Secondary | ICD-10-CM | POA: Diagnosis not present

## 2018-01-27 DIAGNOSIS — J439 Emphysema, unspecified: Secondary | ICD-10-CM

## 2018-01-27 MED ORDER — AZITHROMYCIN 250 MG PO TABS: 250.0000 mg | ORAL_TABLET | Freq: Every day | ORAL | 0 refills | Status: DC

## 2018-01-27 NOTE — Telephone Encounter (Signed)
Spoke with the [employer] and advised that the rx that we sent for daily zithromax was correct  Nothing further needed

## 2018-01-27 NOTE — Progress Notes (Signed)
Synopsis: Referred in 01/2018 for   Subjective:   PATIENT ID: Ellen Bowers GENDER: female DOB: 10-21-1945, MRN: [id number]   HPI  Chief Complaint  Patient presents with  . Follow-up    [facility] patient - has lingering SOB but cough is almost gone. wants to discuss handicap parking   Ellen Bowers is a 73 year old female with COPD/emphysema, pulmonary nodules, hx RLL squamous cell cancer s/p VATS and wedge resection 01/2017 who presents for follow-up.  She is a new patient to me but is a former patient of Dr. Lenna Gilford.  Office notes have been reviewed and summarized as follows: In the last few months she has had pneumonia and chronic bronchitis treated with antibiotics x 2. On her last visit, she was treated with azithromycin, depo 120, prednisone taper and cough syrup. Her current regimen at home is scheduled Pulmicort and Duonebs and Advair 500 BID.   Since she completed her steroids, her productive cough with white and thin and shortness of breath have returned and now worsened over the last two months. Has been compliant with her nebulizers except for her Duonebs which she usually takes twice a day due to work. When she is sick, she has to use the Duobnebs three times a day to make it through the day. Uses her albuterol now every other day at least 1-2 times a day. Cannot walk 200 feet without stopping to rest. She works for [employer] where she walks frequently. Has noticed her pulse oximetry to 82% after activity. It takes about 5-10 minutes to recover to above 88%. Denies fevers, chills, chest pain.  Social History: Stopped smoking in 8 years ago. 50 pack years. Dad had TB when 12 years ago.  Hospice nurse.  Environmental exposures: None   I have personally reviewed patient's past medical/family/social history, allergies, current medications.  Past Medical History:  Diagnosis Date  . Anxiety   . Arthritis    R knee, bilateral hips, hands & neck- being given steroid  injections   . Asthma   . Blood dyscrasia   . Cancer (HCC)    L hand  . COPD (chronic obstructive pulmonary disease) ([place])   . Depression   . Dyspnea   . GERD (gastroesophageal reflux disease)   . Helicobacter pylori gastritis 2005  . Hyperlipidemia   . 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 Von Willebrand disease ([place]) 1996     Family History  Problem Relation Age of Onset  . Colon cancer Mother        Less than age 70  . Colon cancer Father        More than age 53, died from metastatic disease at age 72  . Liver disease Neg Hx      Social History   Occupational History  . Not on file  Tobacco Use  . Smoking status: Former Smoker    Packs/day: 1.00    Years: 50.00    Pack years: 50.00    Types: Cigarettes    Start date: 03/17/1961    Last attempt to quit: 05/30/2009    Years since quitting: 8.6  . Smokeless tobacco: Never Used  . Tobacco comment: Quit smoking x 7 years  Substance and Sexual Activity  . Alcohol use: Yes    Comment: Occasionally    . Drug use: No  . Sexual activity: Not on file    Allergies  Allergen Reactions  . Demerol [Meperidine] Nausea And Vomiting  .  Betadine [Povidone Iodine] Rash    Topical only  . Povidone-Iodine Rash    Topical only     Outpatient Medications Prior to Visit  Medication Sig Dispense Refill  . acetaminophen (TYLENOL) 500 MG tablet Take 1,000 mg by mouth 2 (two) times daily as needed for moderate pain or headache.    . albuterol (PROVENTIL HFA;VENTOLIN HFA) 108 (90 Base) MCG/ACT inhaler Inhale 2 puffs into the lungs every 6 (six) hours as needed for wheezing or shortness of breath.    [name] alum & mag hydroxide-simeth (MAALOX/MYLANTA) 200-200-20 MG/5ML suspension Take 15 mLs by mouth every 6 (six) hours as needed for indigestion or heartburn.     . Ascorbic Acid (VITAMIN C) 1000 MG tablet Take 1,000 mg by mouth daily.     [name] azelastine (ASTELIN) 0.1 % nasal spray Place 2 sprays into both nostrils daily as needed  for rhinitis. Use in each nostril as directed    . b complex vitamins tablet Take 1 tablet by mouth daily.    . budesonide (PULMICORT) 0.5 MG/2ML nebulizer solution Take 0.5 mg by nebulization as needed (for shortness of breath/wheezing.).     [name] buPROPion (WELLBUTRIN SR) 200 MG 12 hr tablet Take 200 mg by mouth 2 (two) times daily.    . Calcium-Magnesium (CAL-MAG PO) Take 1 tablet by mouth at bedtime.    . carboxymethylcellulose (REFRESH PLUS) 0.5 % SOLN Place 1-2 drops into both eyes 2 (two) times daily as needed (for dry eyes). REFRESH    . cetirizine (ZYRTEC) 10 MG tablet Take 10 mg by mouth daily.    . Cholecalciferol (VITAMIN D PO) Take by mouth daily. Unsure of dose    . diclofenac (FLECTOR) 1.3 % PTCH Place 0.25 patches onto the skin as needed. 1/4TH OF PATCH DAILY     . docusate sodium (COLACE) 100 MG capsule Take 100 mg by mouth 2 (two) times daily.     [name] doxylamine, Sleep, (UNISOM) 25 MG tablet Take 25 mg by mouth at bedtime.     . famotidine (PEPCID) 10 MG tablet Take 10 mg by mouth daily. As needed    . fluticasone (FLONASE) 50 MCG/ACT nasal spray Place 2 sprays into both nostrils daily as needed for allergies or rhinitis.    . Fluticasone-Salmeterol (ADVAIR) 500-50 MCG/DOSE AEPB Inhale 1 puff into the lungs 2 (two) times daily.     [name] HYDROcodone-homatropine (HYCODAN) 5-1.5 MG/5ML syrup TAKE 1 TEASPOONFUL (5ML) BY MOUTH EVERY 6 HOURS AS NEEDED 473 mL 0  . ibuprofen (ADVIL,MOTRIN) 800 MG tablet Take 1 tablet by mouth daily as needed 30 tablet 1  . ipratropium-albuterol (DUONEB) 0.5-2.5 (3) MG/3ML SOLN Take 3 mLs by nebulization 3 (three) times daily. (Patient taking differently: Take 3 mLs by nebulization 3 (three) times daily. Scheduled twice daily, may use additional dose as needed) 360 mL 11  . Misc Natural Products (MIDNITE PO) Take 1 tablet by mouth at bedtime.    . montelukast (SINGULAIR) 10 MG tablet Take 10 mg by mouth daily.     . ondansetron (ZOFRAN) 4 MG tablet Take 1 tablet  (4 mg total) by mouth every 8 (eight) hours as needed for nausea or vomiting. 20 tablet 0  . pantoprazole (PROTONIX) 40 MG tablet TAKE ONE TABLET BY MOUTH TWICE DAILY - 30 MINUTES BEFORE BREAKFAST & DINNER 60 tablet 6  . pravastatin (PRAVACHOL) 20 MG tablet Take 20 mg by mouth at bedtime.     . solifenacin (VESICARE) 5 MG tablet Take 5  mg by mouth at bedtime.     . Zoledronic Acid (RECLAST IV) Inject into the vein. Once per year    . methylPREDNISolone (MEDROL) 8 MG tablet Take1tabtwicedailyx1week,1tabdailyx1week,1/2tabdailyx1week,1/2tabeveryotherdaytilgone 28 tablet 0   No facility-administered medications prior to visit.     ROS   Objective:   Vitals:   01/27/18 1056  BP: (!) 140/92  Pulse: 81  SpO2: 97%  Weight: 207 lb 9.6 oz (94.2 kg)   SpO2: 97 % O2 Device: None (Room air)  Physical Exam General: Well-appearing, no acute distress HENT: [place], AT, OP clear, MMM Eyes: EOMI, no scleral icterus Respiratory: Clear to auscultation bilaterally.  No crackles, wheezing or rales Cardiovascular: RRR, -M/R/G, no JVD GI: BS+, soft, nontender Extremities:-Edema,-tenderness Neuro: AAO x4, CNII-XII grossly intact Skin: Intact, no rashes or bruising Psych: Normal mood, normal affect  Chest imaging:  CT 09-22-17 - Emphysema. S/p RLL wedge resection.   PFT: 01/26/17-FEV1/FVC 71 FEV1 (1.97L) 71% FVC 2.86 (78%). Normal lung volumes including TLC. Moderately reduced DLCO.  Ambulatory O2 does not indicate need for oxygen. Saturations above 88% at rest and on exertion.  Imaging, labs and test noted above have been reviewed independently by me.    Assessment & Plan:   COPD with emphysema  Discussion: 73 year old female with COPD who presents for follow-up  Emphysema/COPD, worsening symptoms, not in active exacerbation Group D Classification --Continue your inhalers and nebulizers as prescribed --START azithromycin 250 mg daily  --REFERRAL to pulmonary rehab --We will order pulmonary  function test to evaluate your breathing  --Disability parking form has been completed this visit. Will need to reassess again in 6 months.   Orders Placed This Encounter  Procedures  . AMB referral to pulmonary rehabilitation    Referral Priority:   Routine    Referral Type:   Consultation    Number of Visits Requested:   1  . Pulmonary Function Test    Standing Status:   Future    Standing Expiration Date:   01/28/2019    Scheduling Instructions:     As soon as available    Order Specific Question:   Where should this test be performed?    Answer:   [facility]    Order Specific Question:   Full PFT: includes the following: basic spirometry, spirometry pre & post bronchodilator, diffusion capacity (DLCO), lung volumes    Answer:   Full PFT    Order Specific Question:   MIP/MEP    Answer:   No    Order Specific Question:   6 minute walk    Answer:   No    Order Specific Question:   ABG    Answer:   No    Order Specific Question:   Diffusion capacity (DLCO)    Answer:   Yes    Order Specific Question:   Lung volumes    Answer:   Yes    Order Specific Question:   Methacholine challenge    Answer:   No   Meds ordered this encounter  Medications  . azithromycin (ZITHROMAX) 250 MG tablet    Sig: Take 1 tablet (250 mg total) by mouth daily.    Dispense:  90 tablet    Refill:  0    Return in about 3 months (around 04/28/2018).  Chi Rodman Pickle, MD [facility] 01/29/2018 3:58 PM  Personal pager: [phone] If unanswered, please page CCM On-call: [phone]

## 2018-01-27 NOTE — Patient Instructions (Addendum)
Emphysema/COPD, not in active exacerbation --Continue your inhalers and nebulizers as prescribed --START azithromycin 250 mg daily  --We will order pulmonary function test to evaluate your breathing  --Disability parking form has been completed this visit. Will need to reassess again in 6 months.  Follow-up in 3 months

## 2018-01-31 ENCOUNTER — Ambulatory Visit (INDEPENDENT_AMBULATORY_CARE_PROVIDER_SITE_OTHER): Payer: Medicare Other | Admitting: Pulmonary Disease

## 2018-01-31 DIAGNOSIS — J439 Emphysema, unspecified: Secondary | ICD-10-CM

## 2018-01-31 DIAGNOSIS — J449 Chronic obstructive pulmonary disease, unspecified: Secondary | ICD-10-CM | POA: Diagnosis not present

## 2018-01-31 LAB — PULMONARY FUNCTION TEST
DL/VA % pred: 75 %
DL/VA: 4.06 ml/min/mmHg/L
DLCO cor % pred: 47 %
DLCO cor: 15.19 ml/min/mmHg
DLCO unc % pred: 44 %
DLCO unc: 14.11 ml/min/mmHg
FEF 25-75 Post: 0.87 L/sec
FEF 25-75 Pre: 0.91 L/sec
FEF2575-%Change-Post: -4 %
FEF2575-%Pred-Post: 40 %
FEF2575-%Pred-Pre: 42 %
FEV1-%Change-Post: 0 %
FEV1-%PRED-PRE: 60 %
FEV1-%Pred-Post: 61 %
FEV1-Post: 1.68 L
FEV1-Pre: 1.67 L
FEV1FVC-%Change-Post: 3 %
FEV1FVC-%Pred-Pre: 86 %
FEV6-%Change-Post: -1 %
FEV6-%Pred-Post: 71 %
FEV6-%Pred-Pre: 72 %
FEV6-Post: 2.47 L
FEV6-Pre: 2.51 L
FEV6FVC-%CHANGE-POST: 0 %
FEV6FVC-%Pred-Post: 103 %
FEV6FVC-%Pred-Pre: 102 %
FVC-%Change-Post: -2 %
FVC-%Pred-Post: 68 %
FVC-%Pred-Pre: 70 %
FVC-Post: 2.49 L
FVC-Pre: 2.56 L
POST FEV6/FVC RATIO: 99 %
PRE FEV1/FVC RATIO: 65 %
Post FEV1/FVC ratio: 68 %
Pre FEV6/FVC Ratio: 98 %
RV % pred: 79 %
RV: 1.99 L
TLC % PRED: 75 %
TLC: 4.45 L

## 2018-01-31 NOTE — Progress Notes (Signed)
Full PFT performed today. °

## 2018-02-03 ENCOUNTER — Telehealth: Payer: Self-pay

## 2018-02-03 NOTE — Telephone Encounter (Signed)
-----   Message from Learned, MD sent at 02/03/2018  3:42 PM EST ----- Please contact patient regarding PFT results.  PFTs demonstrate moderate obstructive and restrictive lung defect that is overall unchanged compared to last year. Continue to take your inhalers and daily azithromycin as prescribed.

## 2018-02-03 NOTE — Telephone Encounter (Signed)
Reached patient by phone to give PFT results as reviewed by Dr. Loanne Drilling as follows: PFTs demonstrate moderate obstructive and restrictive lung defect that is overall unchanged compared to last year. Continue to take your inhalers and daily azithromycin as prescribed.  Patient acknowledged understanding of results and is complying with medications.  Pulm Rehab has contacted her but has not had appointment as of yet.  SOB continues.  Advised to follow up sooner if she feels condition is worsening.  Nothing further needed at this time.

## 2018-02-10 ENCOUNTER — Other Ambulatory Visit: Payer: Self-pay | Admitting: *Deleted

## 2018-02-10 DIAGNOSIS — C3431 Malignant neoplasm of lower lobe, right bronchus or lung: Secondary | ICD-10-CM

## 2018-03-09 ENCOUNTER — Encounter: Payer: Self-pay | Admitting: Gastroenterology

## 2018-03-13 IMAGING — CT NM PET TUM IMG RESTAG (PS) SKULL BASE T - THIGH
8 series · 25 of 25 positions shown · non-contrast
Comparison: 05/14/2016

CLINICAL DATA: Subsequent treatment strategy for pulmonary nodule..

EXAM:
NUCLEAR MEDICINE PET SKULL BASE TO THIGH
TECHNIQUE: 10 mCi F-18 FDG was injected intravenously. Full-ring PET imaging
was performed from the skull base to thigh after the radiotracer. CT
data was obtained and used for attenuation correction and anatomic
localization.
FASTING BLOOD GLUCOSE:  Value: 96 mg/dl

[Series 3: pet sk_thigh ac · axial · 5.0mm · 4.07mm/px · z∈[-1346,-398]mm · 5 of 238 slices shown]
[im 1/238]
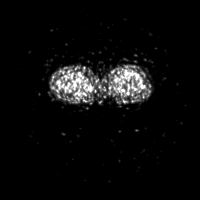
[im 60/238]
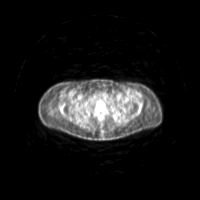
[im 119/238]
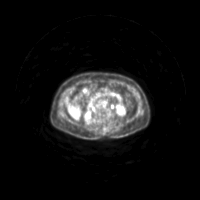
[im 178/238]
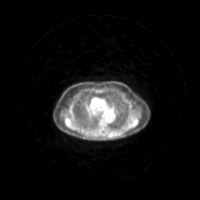
[im 238/238]
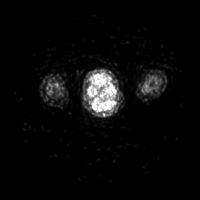

[Series 4: ct sk_thigh 5.0 b31f · axial · 5.0mm · 0.98mm/px · z∈[-1346,-398]mm · 5 of 238 slices shown]
[im 1/238]
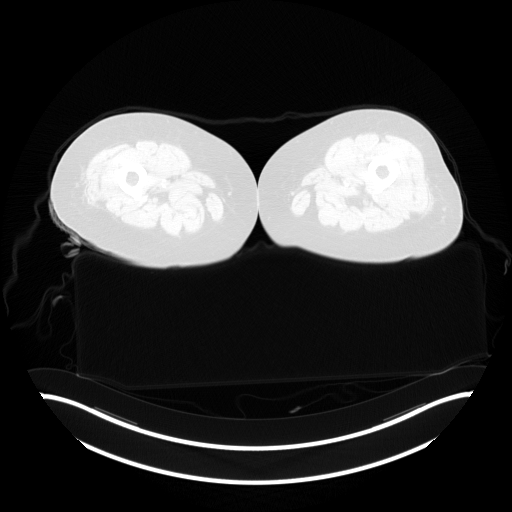
[im 60/238]
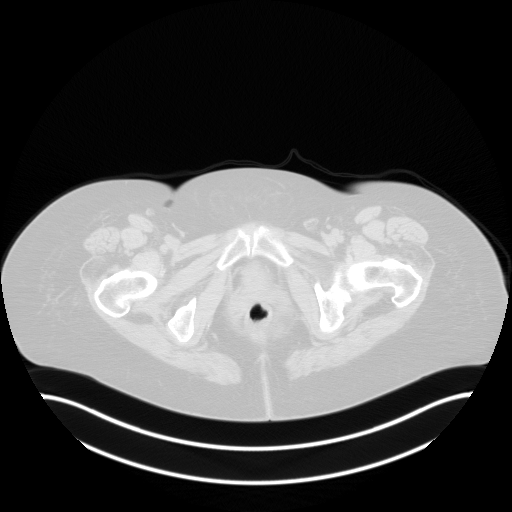
[im 119/238]
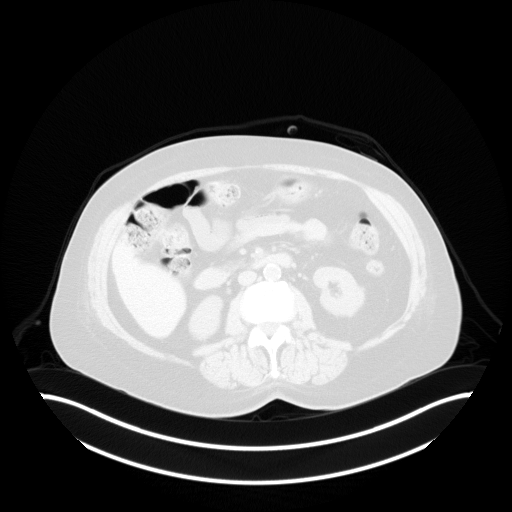
[im 178/238]
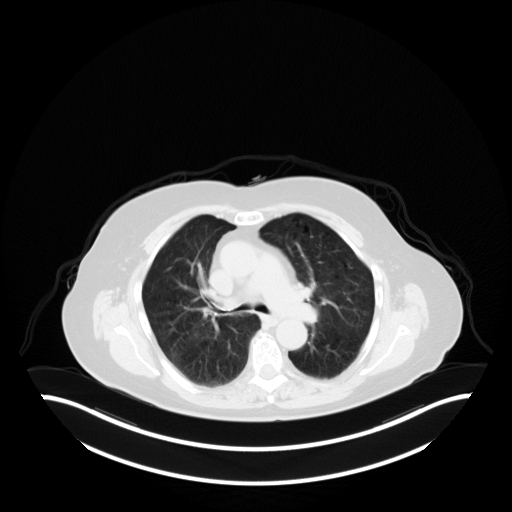
[im 238/238  brain]
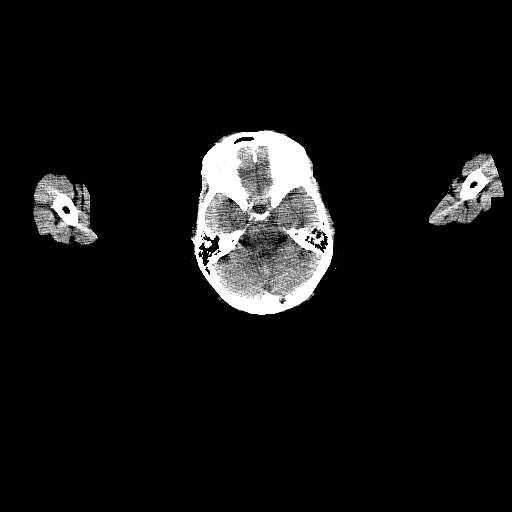

[Series 5: pet sk_thigh nac · axial · 5.0mm · 4.07mm/px · z∈[-1346,-398]mm · 5 of 238 slices shown]
[im 1/238]
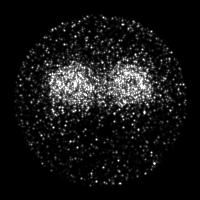
[im 60/238]
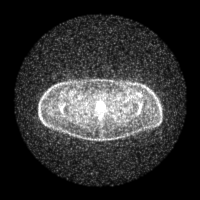
[im 119/238]
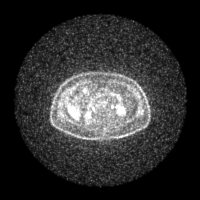
[im 178/238]
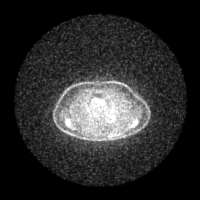
[im 238/238]
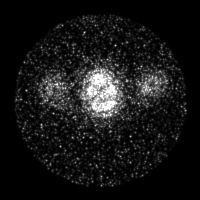

[Series 8: ct sk_thigh 5.0 (id) lung_bone · axial · 5.0mm · 0.67mm/px · 1 of 66 slices shown]
[im 1/66  bone]
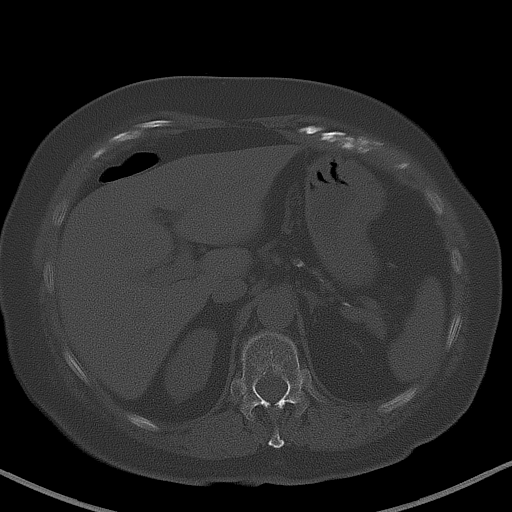

[Series 603: range-ct sk_thigh 5.0 (id)<alpha range> · 2 of 71 slices shown (1 of 2)]
[im 1/71]
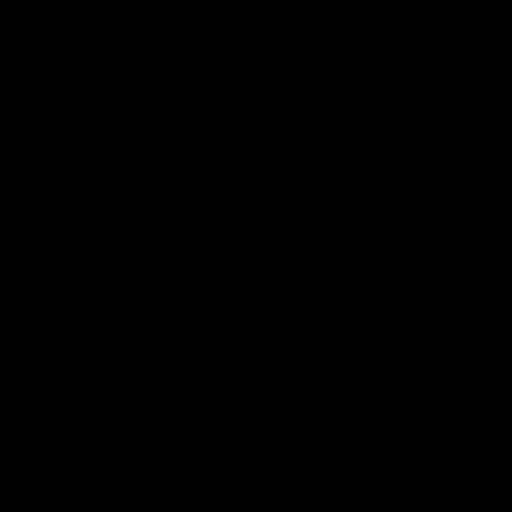
[im 71/71]
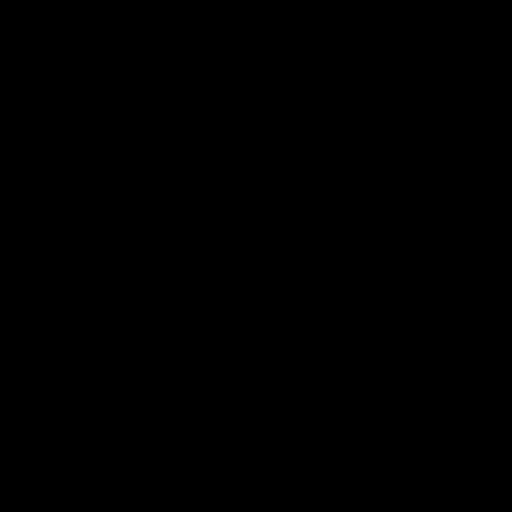

[Series 604: mip range · coronal · 1.97mm/px · 1 of 32 slices shown]
[im 1/32]
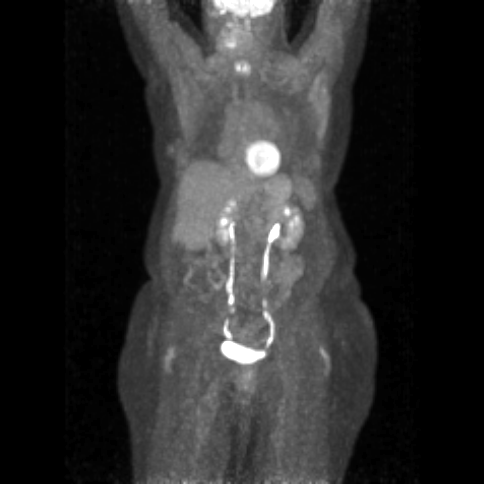

[Series 605: range-ct sk_thigh 5.0 (id)<alpha range> · 5 of 230 slices shown (2 of 2)]
[im 1/230]
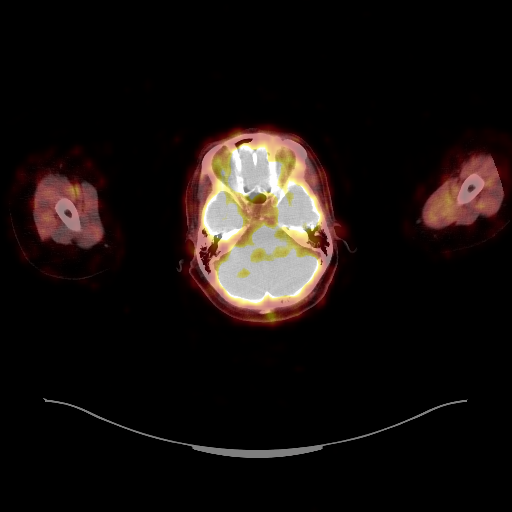
[im 58/230]
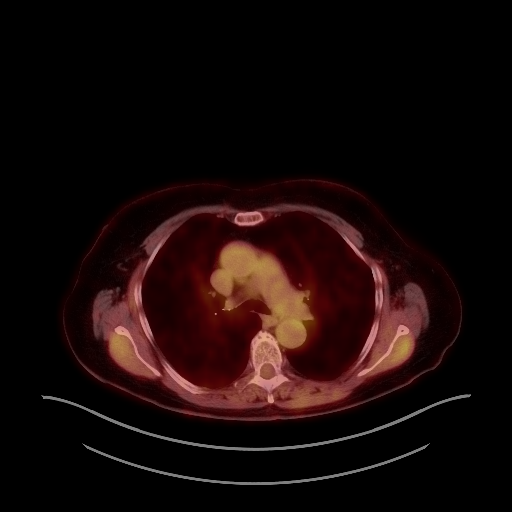
[im 115/230]
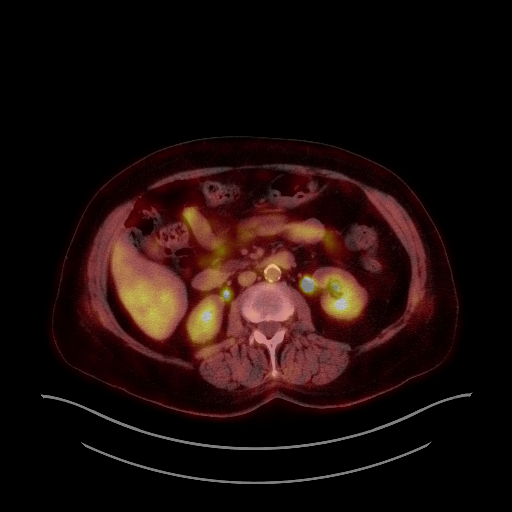
[im 172/230]
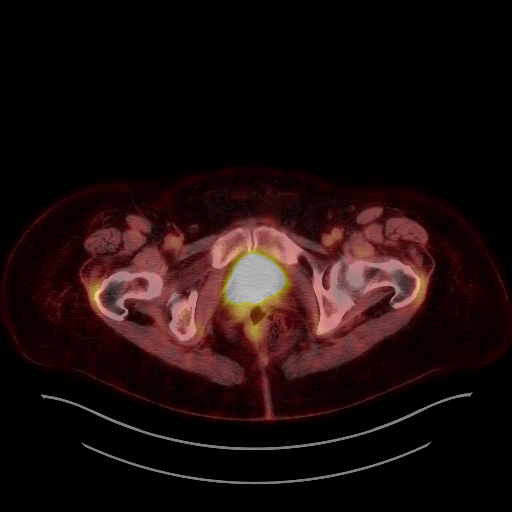
[im 230/230]
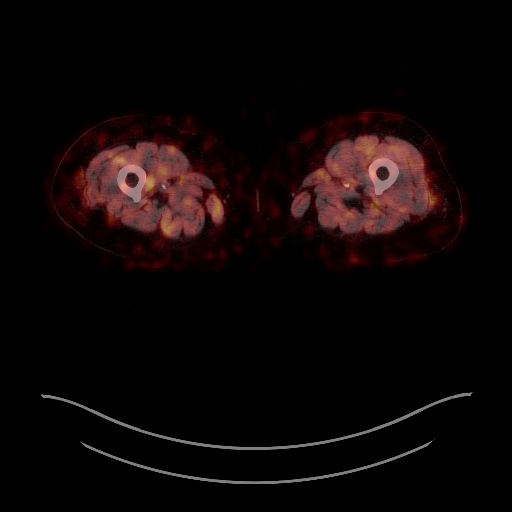

[Series 1077: results mm oncology reading · 5.0mm · 1.32mm/px · 1 of 2 slices shown]
[im 1/2]
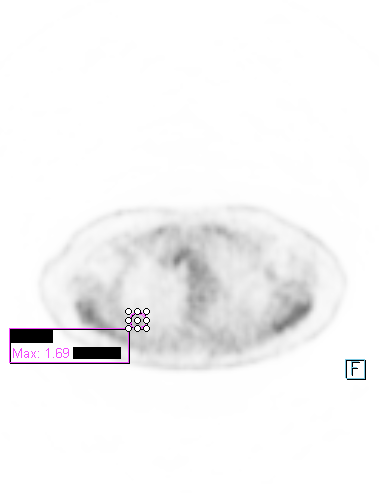

[25 of 25 positions shown; findings below may reference images not displayed]

FINDINGS: NECK: No hypermetabolic lymph nodes in the neck.

CHEST: 9 mm irregular opacity posterior right upper lobe (image 16
series 8) is not substantially changed in the interval and religious
represents architectural distortion, but has demonstrable FDG
accumulation with SUV max = 1.7.

Posterior right lower lobe lesion in question measures 2.2 cm on CT
imaging and demonstrates FDG uptake with SUV max = 2.4.

Emphysema noted bilaterally. There is biapical pleural-parenchymal
scarring. Coronary artery and thoracic aortic atherosclerosis.

ABDOMEN/PELVIS: No abnormal hypermetabolic activity within the
liver, pancreas, adrenal glands, or spleen. No hypermetabolic lymph
nodes in the abdomen or pelvis.

2 cm hypoattenuating lesion subcapsular medial segment left liver
shows no hypermetabolism. Probable tiny gallstone. There is
abdominal aortic atherosclerosis without aneurysm.

SKELETON: No focal hypermetabolic activity to suggest skeletal
metastasis.
IMPRESSION: 1. FDG uptake in the posterior right lower lobe pulmonary nodule in
question is identified although relatively low level. Nevertheless
given progressing size and demonstrable FDG accumulation, neoplasm
is suspected.
2. Second tiny 9 mm parenchymal irregularity posterior right upper
lobe demonstrates FDG uptake. Although uptake is low, given the tiny
irregular nature of this lesion, close follow-up is recommended as
neoplasm cannot be excluded.
3.  Aortic Atherosclerois (JCYKM-170.0)
4.  Emphysema. (JCYKM-DNW.I).

## 2018-03-17 ENCOUNTER — Encounter (HOSPITAL_COMMUNITY)
Admission: RE | Admit: 2018-03-17 | Discharge: 2018-03-17 | Disposition: A | Discharge status: Home / Self Care | Payer: Medicare Other | Source: Ambulatory Visit | Attending: Pulmonary Disease | Admitting: Pulmonary Disease

## 2018-03-17 VITALS — BP 128/70 | HR 81 | Ht 69.0 in | Wt 206.5 lb

## 2018-03-17 DIAGNOSIS — J449 Chronic obstructive pulmonary disease, unspecified: Secondary | ICD-10-CM | POA: Diagnosis not present

## 2018-03-17 NOTE — Progress Notes (Signed)
Cardiac/Pulmonary Rehab Medication Review by a Pharmacist  Does the patient  feel that his/her medications are working for him/her?  yes  Has the patient been experiencing any side effects to the medications prescribed?  no  Does the patient measure his/her own blood pressure or blood glucose at home?  no   Does the patient have any problems obtaining medications due to transportation or finances?   no  Understanding of regimen: excellent Understanding of indications: excellent Potential of compliance: excellent  Questions asked to Determine Patient Understanding of Medication Regimen:  1. What is the name of the medication?  2. What is the medication used for?  3. When should it be taken?  4. How much should be taken?  5. How will you take it?  6. What side effects should you report?  Understanding Defined as: Excellent: All questions above are correct Good: Questions 1-4 are correct Fair: Questions 1-2 are correct  Poor: 1 or none of the above questions are correct   Pharmacist comments: Patient presents today for pulmonary rehab.We reviewed her medications. She is tolerating her current regimen. Her only concern was the pain in her knee and she will be receiving a steroid injection next week. She said she need "knee surgery" but trying to avoid. She is on chronic nsaid as well for the knee pain. She is taking famotidine with the ibuprofen even though she is on protonix. She was having some GI distress and the combination helps her.  She monitors her O2 periodically when needed.  Continue current regimen  Thanks for the opportunity to participate in the care of this patient,  Isac Sarna, BS [place], [place] Pharmacist Pager [phone] 03/17/2018 8:53 AM

## 2018-03-17 NOTE — Progress Notes (Signed)
Pulmonary Individual Treatment Plan  Patient Details  Name: Ellen Bowers MRN: [id number] Date of Birth: Oct 10, 1945 Referring Provider:     PULMONARY REHAB COPD ORIENTATION from 03/17/2018 in [place]  Referring Provider  Loanne Drilling      Initial Encounter Date:    PULMONARY REHAB COPD ORIENTATION from 03/17/2018 in [place]  Date  03/17/18      Visit Diagnosis: Chronic obstructive pulmonary disease, unspecified COPD type ([place])  Patient's Home Medications on Admission:   Current Outpatient Medications:  .  Echinacea 125 MG TABS, Take 1 tablet by mouth daily., Disp: , Rfl:  .  acetaminophen (TYLENOL) 500 MG tablet, Take 1,000 mg by mouth 2 (two) times daily as needed for moderate pain or headache., Disp: , Rfl:  .  albuterol (PROVENTIL HFA;VENTOLIN HFA) 108 (90 Base) MCG/ACT inhaler, Inhale 2 puffs into the lungs every 6 (six) hours as needed for wheezing or shortness of breath., Disp: , Rfl:  .  alum & mag hydroxide-simeth (MAALOX/MYLANTA) 200-200-20 MG/5ML suspension, Take 15 mLs by mouth every 6 (six) hours as needed for indigestion or heartburn. , Disp: , Rfl:  .  Ascorbic Acid (VITAMIN C) 1000 MG tablet, Take 1,000 mg by mouth daily. , Disp: , Rfl:  .  azelastine (ASTELIN) 0.1 % nasal spray, Place 2 sprays into both nostrils daily as needed for rhinitis. Use in each nostril as directed, Disp: , Rfl:  .  azithromycin (ZITHROMAX) 250 MG tablet, Take 1 tablet (250 mg total) by mouth daily., Disp: 90 tablet, Rfl: 0 .  b complex vitamins tablet, Take 1 tablet by mouth daily., Disp: , Rfl:  .  budesonide (PULMICORT) 0.5 MG/2ML nebulizer solution, Take 0.5 mg by nebulization as needed (for shortness of breath/wheezing.). , Disp: , Rfl:  .  buPROPion (WELLBUTRIN SR) 200 MG 12 hr tablet, Take 200 mg by mouth 2 (two) times daily., Disp: , Rfl:  .  Calcium-Magnesium (CAL-MAG PO), Take 1 tablet by mouth at bedtime., Disp: , Rfl:  .   carboxymethylcellulose (REFRESH PLUS) 0.5 % SOLN, Place 1-2 drops into both eyes 2 (two) times daily as needed (for dry eyes). REFRESH, Disp: , Rfl:  .  cetirizine (ZYRTEC) 10 MG tablet, Take 10 mg by mouth daily., Disp: , Rfl:  .  Cholecalciferol (VITAMIN D PO), Take by mouth daily. Unsure of dose, Disp: , Rfl:  .  diclofenac (FLECTOR) 1.3 % PTCH, Place 0.25 patches onto the skin as needed. 1/4TH OF PATCH DAILY , Disp: , Rfl:  .  docusate sodium (COLACE) 100 MG capsule, Take 100 mg by mouth 2 (two) times daily. , Disp: , Rfl:  .  doxylamine, Sleep, (UNISOM) 25 MG tablet, Take 25 mg by mouth at bedtime. , Disp: , Rfl:  .  famotidine (PEPCID) 10 MG tablet, Take 10 mg by mouth daily. As needed, Disp: , Rfl:  .  fluticasone (FLONASE) 50 MCG/ACT nasal spray, Place 2 sprays into both nostrils daily as needed for allergies or rhinitis., Disp: , Rfl:  .  Fluticasone-Salmeterol (ADVAIR) 500-50 MCG/DOSE AEPB, Inhale 1 puff into the lungs 2 (two) times daily. , Disp: , Rfl:  .  HYDROcodone-homatropine (HYCODAN) 5-1.5 MG/5ML syrup, TAKE 1 TEASPOONFUL (5ML) BY MOUTH EVERY 6 HOURS AS NEEDED, Disp: 473 mL, Rfl: 0 .  ibuprofen (ADVIL,MOTRIN) 800 MG tablet, Take 1 tablet by mouth daily as needed, Disp: 30 tablet, Rfl: 1 .  ipratropium-albuterol (DUONEB) 0.5-2.5 (3) MG/3ML SOLN, Take 3 mLs by nebulization 3 (  three) times daily. (Patient taking differently: Take 3 mLs by nebulization 3 (three) times daily. Scheduled twice daily, may use additional dose as needed), Disp: 360 mL, Rfl: 11 .  Misc Natural Products (MIDNITE PO), Take 1 tablet by mouth at bedtime., Disp: , Rfl:  .  montelukast (SINGULAIR) 10 MG tablet, Take 10 mg by mouth daily. , Disp: , Rfl:  .  ondansetron (ZOFRAN) 4 MG tablet, Take 1 tablet (4 mg total) by mouth every 8 (eight) hours as needed for nausea or vomiting. (Patient not taking: Reported on 03/17/2018), Disp: 20 tablet, Rfl: 0 .  pantoprazole (PROTONIX) 40 MG tablet, TAKE ONE TABLET BY MOUTH TWICE  DAILY - 30 MINUTES BEFORE BREAKFAST & DINNER, Disp: 60 tablet, Rfl: 6 .  pravastatin (PRAVACHOL) 20 MG tablet, Take 20 mg by mouth at bedtime. , Disp: , Rfl:  .  solifenacin (VESICARE) 5 MG tablet, Take 5 mg by mouth at bedtime. , Disp: , Rfl:  .  Zoledronic Acid (RECLAST IV), Inject into the vein. Once per year, Disp: , Rfl:   Past Medical History: Past Medical History:  Diagnosis Date  . Anxiety   . Arthritis    R knee, bilateral hips, hands & neck- being given steroid injections   . Asthma   . Blood dyscrasia   . Cancer (HCC)    L hand  . COPD (chronic obstructive pulmonary disease) ([place])   . Depression   . Dyspnea   . GERD (gastroesophageal reflux disease)   . Helicobacter pylori gastritis 2005  . Hyperlipidemia   . 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 Von Willebrand disease ([place]) 1996    Tobacco Use: Social History   Tobacco Use  Smoking Status Former Smoker  . Packs/day: 1.00  . Years: 50.00  . Pack years: 50.00  . Types: Cigarettes  . Start date: 03/17/1961  . Last attempt to quit: 05/30/2009  . Years since quitting: 8.8  Smokeless Tobacco Never Used  Tobacco Comment   Quit smoking x 7 years    Labs: Recent Review Flowsheet Data    Labs for ITP Cardiac and Pulmonary Rehab Latest Ref Rng & Units 02/04/2017 02/08/2017 02/09/2017   PHART 7.350 - 7.450 7.461(H) 7.338(L) 7.423   PCO2ART 32.0 - 48.0 mmHg 33.1 48.6(H) 37.6   HCO3 20.0 - 28.0 mmol/L 23.3 26.3 24.1   TCO2 22 - 32 mmol/L - 28 -   ACIDBASEDEF 0.0 - 2.0 mmol/L 0.1 - -   O2SAT % 96.9 95.0 98.4      Capillary Blood Glucose: Lab Results  Component Value Date   GLUCAP 119 (H) 02/10/2017   GLUCAP 147 (H) 02/09/2017   GLUCAP 136 (H) 02/09/2017   GLUCAP 141 (H) 02/09/2017   GLUCAP 131 (H) 02/09/2017     Pulmonary Assessment Scores: Pulmonary Assessment Scores    Row Name 03/17/18 1132         ADL UCSD   ADL Phase  Entry     SOB Score total  50     Rest  0     Walk  5     Stairs   3     Bath  3     Dress  2     Shop  1       CAT Score   CAT Score  22       mMRC Score   mMRC Score  3  Pulmonary Function Assessment: Pulmonary Function Assessment - 03/17/18 1125      Pulmonary Function Tests   FVC%  70 %    FEV1%  60 %    FEV1/FVC Ratio  86    RV%  79 %    DLCO%  44 %      Initial Spirometry Results   FVC%  70 %    FEV1%  60 %    FEV1/FVC Ratio  86      Post Bronchodilator Spirometry Results   FVC%  68 %    FEV1%  61 %    FEV1/FVC Ratio  68      Breath   Bilateral Breath Sounds  Wheezes    Shortness of Breath  Yes;Limiting activity       Exercise Target Goals: Exercise Program Goal: Individual exercise prescription set using results from initial 6 min walk test and THRR while considering  patient's activity barriers and safety.   Exercise Prescription Goal: Initial exercise prescription builds to 30-45 minutes a day of aerobic activity, 2-3 days per week.  Home exercise guidelines will be given to patient during program as part of exercise prescription that the participant will acknowledge.  Activity Barriers & Risk Stratification: Activity Barriers & Cardiac Risk Stratification - 03/17/18 1009      Activity Barriers & Cardiac Risk Stratification   Activity Barriers  Muscular Weakness;Shortness of Breath;Deconditioning;Other (comment)    Comments  R knee pain, bilateral hip pain     Cardiac Risk Stratification  High       6 Minute Walk: 6 Minute Walk    Row Name 03/17/18 1007         6 Minute Walk   Phase  Initial     Distance  1200 feet     Walk Time  6 minutes     # of Rest Breaks  0     MPH  2.27     METS  2.74     RPE  13     Perceived Dyspnea   14     VO2 Peak  9.64     Symptoms  Yes (comment)     Comments  2/10 R knee pain, 3/10 bilateral hip pain, lightheadedness      Resting HR  81 bpm     Resting BP  128/70     Resting Oxygen Saturation   99 %     Exercise Oxygen Saturation  during 6 min walk  91 %      Max Ex. HR  106 bpm     Max Ex. BP  160/80     2 Minute Post BP  136/76        Oxygen Initial Assessment: Oxygen Initial Assessment - 03/17/18 1124      Home Oxygen   Home Oxygen Device  None    Sleep Oxygen Prescription  None    Home Exercise Oxygen Prescription  None    Home at Rest Exercise Oxygen Prescription  None    Compliance with Home Oxygen Use  --   N/A     Initial 6 min Walk   Oxygen Used  None      Program Oxygen Prescription   Program Oxygen Prescription  None       Oxygen Re-Evaluation:   Oxygen Discharge (Final Oxygen Re-Evaluation):   Initial Exercise Prescription: Initial Exercise Prescription - 03/17/18 1000      Date of Initial Exercise RX and Referring Provider  Date  03/17/18    Referring Provider  Loanne Drilling    Expected Discharge Date  06/17/18      Treadmill   MPH  1    Grade  0    Minutes  17    METs  1.88      NuStep   Level  1    SPM  40    Minutes  17    METs  1.8      Prescription Details   Frequency (times per week)  2    Duration  Progress to 30 minutes of continuous aerobic without signs/symptoms of physical distress      Intensity   THRR 40-80% of Max Heartrate  107-121-134    Ratings of Perceived Exertion  11-13    Perceived Dyspnea  0-4      Progression   Progression  Continue to progress workloads to maintain intensity without signs/symptoms of physical distress.      Resistance Training   Training Prescription  Yes    Weight  1    Reps  10-15       Perform Capillary Blood Glucose checks as needed.  Exercise Prescription Changes:  Exercise Prescription Changes    Row Name 03/17/18 1000             Response to Exercise   Blood Pressure (Admit)  128/70       Blood Pressure (Exercise)  160/80       Blood Pressure (Exit)  136/76       Heart Rate (Admit)  81 bpm       Heart Rate (Exercise)  106 bpm       Heart Rate (Exit)  78 bpm       Oxygen Saturation (Admit)  99 %       Oxygen Saturation (Exercise)   91 %       Oxygen Saturation (Exit)  98 %       Rating of Perceived Exertion (Exercise)  13       Perceived Dyspnea (Exercise)  14       Symptoms  R knee pain, bilateral hip pain        Comments  6 minute walk test       Duration  Progress to 30 minutes of  aerobic without signs/symptoms of physical distress       Intensity  THRR New 107-121-139         Home Exercise Plan   Plans to continue exercise at  Home (comment)       Frequency  Add 3 additional days to program exercise sessions.       Initial Home Exercises Provided  03/17/18          Exercise Comments:   Exercise Goals and Review:  Exercise Goals    Row Name 03/17/18 1014             Exercise Goals   Increase Physical Activity  Yes       Intervention  Provide advice, education, support and counseling about physical activity/exercise needs.;Develop an individualized exercise prescription for aerobic and resistive training based on initial evaluation findings, risk stratification, comorbidities and participant's personal goals.       Expected Outcomes  Short Term: Attend rehab on a regular basis to increase amount of physical activity.;Long Term: Add in home exercise to make exercise part of routine and to increase amount of physical activity.;Long Term: Exercising regularly at least 3-5 days a week.  Increase Strength and Stamina  Yes       Intervention  Provide advice, education, support and counseling about physical activity/exercise needs.;Develop an individualized exercise prescription for aerobic and resistive training based on initial evaluation findings, risk stratification, comorbidities and participant's personal goals.       Expected Outcomes  Short Term: Increase workloads from initial exercise prescription for resistance, speed, and METs.       Able to understand and use rate of perceived exertion (RPE) scale  Yes       Intervention  Provide education and explanation on how to use RPE scale        Expected Outcomes  Short Term: Able to use RPE daily in rehab to express subjective intensity level;Long Term:  Able to use RPE to guide intensity level when exercising independently       Able to understand and use Dyspnea scale  Yes       Intervention  Provide education and explanation on how to use Dyspnea scale       Expected Outcomes  Short Term: Able to use Dyspnea scale daily in rehab to express subjective sense of shortness of breath during exertion;Long Term: Able to use Dyspnea scale to guide intensity level when exercising independently       Knowledge and understanding of Target Heart Rate Range (THRR)  Yes       Intervention  Provide education and explanation of THRR including how the numbers were predicted and where they are located for reference       Expected Outcomes  Short Term: Able to state/look up THRR       Able to check pulse independently  Yes       Intervention  Provide education and demonstration on how to check pulse in carotid and radial arteries.;Review the importance of being able to check your own pulse for safety during independent exercise       Expected Outcomes  Short Term: Able to explain why pulse checking is important during independent exercise;Long Term: Able to check pulse independently and accurately       Understanding of Exercise Prescription  Yes       Intervention  Provide education, explanation, and written materials on patient's individual exercise prescription       Expected Outcomes  Short Term: Able to explain program exercise prescription;Long Term: Able to explain home exercise prescription to exercise independently          Exercise Goals Re-Evaluation :   Discharge Exercise Prescription (Final Exercise Prescription Changes): Exercise Prescription Changes - 03/17/18 1000      Response to Exercise   Blood Pressure (Admit)  128/70    Blood Pressure (Exercise)  160/80    Blood Pressure (Exit)  136/76    Heart Rate (Admit)  81 bpm    Heart  Rate (Exercise)  106 bpm    Heart Rate (Exit)  78 bpm    Oxygen Saturation (Admit)  99 %    Oxygen Saturation (Exercise)  91 %    Oxygen Saturation (Exit)  98 %    Rating of Perceived Exertion (Exercise)  13    Perceived Dyspnea (Exercise)  14    Symptoms  R knee pain, bilateral hip pain     Comments  6 minute walk test    Duration  Progress to 30 minutes of  aerobic without signs/symptoms of physical distress    Intensity  THRR New   107-121-139     Home Exercise  Plan   Plans to continue exercise at  Home (comment)    Frequency  Add 3 additional days to program exercise sessions.    Initial Home Exercises Provided  03/17/18       Nutrition:  Target Goals: Understanding of nutrition guidelines, daily intake of sodium 1500mg , cholesterol 200mg , calories 30% from fat and 7% or less from saturated fats, daily to have 5 or more servings of fruits and vegetables.  Biometrics: Pre Biometrics - 03/17/18 1015      Pre Biometrics   Height  5\' 9"  (1.753 m)    Waist Circumference  42 inches    Hip Circumference  46 inches    Waist to Hip Ratio  0.91 %    Triceps Skinfold  24 mm    % Body Fat  41.6 %    Grip Strength  10 kg    Flexibility  0 in    Single Leg Stand  5.84 seconds        Nutrition Therapy Plan and Nutrition Goals: Nutrition Therapy & Goals - 03/17/18 1138      Personal Nutrition Goals   Personal Goal #2  Patient is trying to eat healthy but her husband eats what he wants so his influence affects her choices.     Additional Goals?  No       Nutrition Assessments: Nutrition Assessments - 03/17/18 1139      MEDFICTS Scores   Pre Score  77       Nutrition Goals Re-Evaluation:   Nutrition Goals Discharge (Final Nutrition Goals Re-Evaluation):   Psychosocial: Target Goals: Acknowledge presence or absence of significant depression and/or stress, maximize coping skills, provide positive support system. Participant is able to verbalize types and ability to  use techniques and skills needed for reducing stress and depression.  Initial Review & Psychosocial Screening: Initial Psych Review & Screening - 03/17/18 1135      Initial Review   Current issues with  History of Depression      Family Dynamics   Good Support System?  Yes      Barriers   Psychosocial barriers to participate in program  The patient should benefit from training in stress management and relaxation.      Screening Interventions   Interventions  Encouraged to exercise    Expected Outcomes  Short Term goal: Identification and review with participant of any Quality of Life or Depression concerns found by scoring the questionnaire.;Long Term goal: The participant improves quality of Life and PHQ9 Scores as seen by post scores and/or verbalization of changes       Quality of Life Scores: Quality of Life - 03/17/18 1018      Quality of Life   Select  Quality of Life      Quality of Life Scores   Health/Function Pre  15.59 %    Socioeconomic Pre  21.29 %    Psych/Spiritual Pre  13.86 %    Family Pre  13.1 %    GLOBAL Pre  16.03 %      Scores of 19 and below usually indicate a poorer quality of life in these areas.  A difference of  2-3 points is a clinically meaningful difference.  A difference of 2-3 points in the total score of the Quality of Life Index has been associated with significant improvement in overall quality of life, self-image, physical symptoms, and general health in studies assessing change in quality of life.   PHQ-9: Recent Review Flowsheet  Data    Depression screen [facility] 2/9 03/17/2018   Decreased Interest 1   Down, Depressed, Hopeless 0   PHQ - 2 Score 1   Altered sleeping 3   Tired, decreased energy 1   Change in appetite 2   Feeling bad or failure about yourself  0   Trouble concentrating 0   Moving slowly or fidgety/restless 0   Suicidal thoughts 0   PHQ-9 Score 7   Difficult doing work/chores Somewhat difficult     Interpretation of  Total Score  Total Score Depression Severity:  1-4 = Minimal depression, 5-9 = Mild depression, 10-14 = Moderate depression, 15-19 = Moderately severe depression, 20-27 = Severe depression   Psychosocial Evaluation and Intervention: Psychosocial Evaluation - 03/17/18 1137      Psychosocial Evaluation & Interventions   Interventions  Encouraged to exercise with the program and follow exercise prescription    Continue Psychosocial Services   Follow up required by staff       Psychosocial Re-Evaluation:   Psychosocial Discharge (Final Psychosocial Re-Evaluation):    Education: Education Goals: Education classes will be provided on a weekly basis, covering required topics. Participant will state understanding/return demonstration of topics presented.  Learning Barriers/Preferences: Learning Barriers/Preferences - 03/17/18 1139      Learning Barriers/Preferences   Learning Barriers  None       Education Topics: How Lungs Work and Diseases: - Discuss the anatomy of the lungs and diseases that can affect the lungs, such as COPD.   Exercise: -Discuss the importance of exercise, FITT principles of exercise, normal and abnormal responses to exercise, and how to exercise safely.   Environmental Irritants: -Discuss types of environmental irritants and how to limit exposure to environmental irritants.   Meds/Inhalers and oxygen: - Discuss respiratory medications, definition of an inhaler and oxygen, and the proper way to use an inhaler and oxygen.   Energy Saving Techniques: - Discuss methods to conserve energy and decrease shortness of breath when performing activities of daily living.    Bronchial Hygiene / Breathing Techniques: - Discuss breathing mechanics, pursed-lip breathing technique,  proper posture, effective ways to clear airways, and other functional breathing techniques   Cleaning Equipment: - Provides group verbal and written instruction about the health  risks of elevated stress, cause of high stress, and healthy ways to reduce stress.   Nutrition I: Fats: - Discuss the types of cholesterol, what cholesterol does to the body, and how cholesterol levels can be controlled.   Nutrition II: Labels: -Discuss the different components of food labels and how to read food labels.   Respiratory Infections: - Discuss the signs and symptoms of respiratory infections, ways to prevent respiratory infections, and the importance of seeking medical treatment when having a respiratory infection.   Stress I: Signs and Symptoms: - Discuss the causes of stress, how stress may lead to anxiety and depression, and ways to limit stress.   Stress II: Relaxation: -Discuss relaxation techniques to limit stress.   Oxygen for Home/Travel: - Discuss how to prepare for travel when on oxygen and proper ways to transport and store oxygen to ensure safety.   Knowledge Questionnaire Score: Knowledge Questionnaire Score - 03/17/18 1013      Knowledge Questionnaire Score   Pre Score  16/18       Core Components/Risk Factors/Patient Goals at Admission: Personal Goals and Risk Factors at Admission - 03/17/18 1141      Core Components/Risk Factors/Patient Goals on Admission    Weight Management  Yes    Intervention  Weight Management: Develop a combined nutrition and exercise program designed to reach desired caloric intake, while maintaining appropriate intake of nutrient and fiber, sodium and fats, and appropriate energy expenditure required for the weight goal.    Admit Weight  206 lb 8 oz (93.7 kg)    Goal Weight: Short Term  196 lb 8 oz (89.1 kg)    Goal Weight: Long Term  186 lb 8 oz (84.6 kg)    Personal Goal Other  Yes    Personal Goal  Lose 20lbs, ambulate better w/SOB, increase endurance     Intervention  Attend PR 2 x week, supplement with 3 x week at home exercise.     Expected Outcomes  Reach personal goal.        Core Components/Risk  Factors/Patient Goals Review:    Core Components/Risk Factors/Patient Goals at Discharge (Final Review):    ITP Comments:   Comments: Patient arrived for 1st visit/orientation/education at 0800. Patient was referred to PR by Dr. Loanne Drilling due to COPD (J44.9). During orientation advised patient on arrival and appointment times what to wear, what to do before, during and after exercise. Reviewed attendance and class policy. Talked about inclement weather and class consultation policy. Pt is scheduled to return Pulmonary Rehab on 03/22/18 at 1045. Pt was advised to come to class 15 minutes before class starts. Patient was also given instructions on meeting with the dietician and attending the Family Structure classes. Discussed RPE/Dpysnea scales. Discussed initial THR and how to find their radial and/or carotid pulse. Discussed the initial exercise prescription and how this effects their progress. Pt is eager to get started. Patient participated in warm up stretches followed by light weights and resistance bands. Patient was able to complete 6 minute walk test. Patient c/o bilateral hip pain 3/10 and (R) knee pain 2/10 and light headedness during walk test. Pain subsided to 0/10 at 2 minute rest. Pain was completely gone at the end of orientation. Patient was measured for the equipment. Discussed equipment safety with patient. Took patient pre-anthropometric measurements. Patient finished visit at 10:00.

## 2018-03-17 NOTE — Progress Notes (Signed)
Daily Session Note  Patient Details  Name: Ellen Bowers MRN: [id number] Date of Birth: 1945/12/29 Referring Provider:     PULMONARY REHAB COPD ORIENTATION from 03/17/2018 in [place]  Referring Provider  Loanne Drilling      Encounter Date: 03/17/2018  Check In: Session Check In - 03/17/18 0800      Check-In   Supervising physician immediately available to respond to emergencies  See telemetry face sheet for immediately available MD    Location  [facility]    Staff Present  Russella Dar, MS, EP, [facility], Exercise Physiologist;Amanda Ballard, Exercise Physiologist;Debra Wynetta Emery, RN, BSN    Medication changes reported      No    Fall or balance concerns reported     Yes    Comments  Just a little due to chronic knee pain    Tobacco Cessation  --   Quit 2012   Warm-up and Cool-down  Performed as group-led instruction    Resistance Training Performed  Yes    VAD Patient?  No    PAD/SET Patient?  No      Pain Assessment   Currently in Pain?  No/denies    Multiple Pain Sites  No       Capillary Blood Glucose: No results found for this or any previous visit (from the past 24 hour(s)).  Exercise Prescription Changes - 03/17/18 1000      Response to Exercise   Blood Pressure (Admit)  128/70    Blood Pressure (Exercise)  160/80    Blood Pressure (Exit)  136/76    Heart Rate (Admit)  81 bpm    Heart Rate (Exercise)  106 bpm    Heart Rate (Exit)  78 bpm    Oxygen Saturation (Admit)  99 %    Oxygen Saturation (Exercise)  91 %    Oxygen Saturation (Exit)  98 %    Rating of Perceived Exertion (Exercise)  13    Perceived Dyspnea (Exercise)  14    Symptoms  R knee pain, bilateral hip pain     Comments  6 minute walk test    Duration  Progress to 30 minutes of  aerobic without signs/symptoms of physical distress    Intensity  THRR New   [phone]     Home Exercise Plan   Plans to continue exercise at  Home (comment)    Frequency  Add 3  additional days to program exercise sessions.    Initial Home Exercises Provided  03/17/18       Social History   Tobacco Use  Smoking Status Former Smoker  . Packs/day: 1.00  . Years: 50.00  . Pack years: 50.00  . Types: Cigarettes  . Start date: 03/17/1961  . Last attempt to quit: 05/30/2009  . Years since quitting: 8.8  Smokeless Tobacco Never Used  Tobacco Comment   Quit smoking x 7 years    Goals Met:  Independence with exercise equipment Exercise tolerated well Personal goals reviewed No report of cardiac concerns or symptoms Strength training completed today  Goals Unmet:  Not Applicable  Comments: Check out: 10:00   Dr. Sinda Du is Medical Director for [facility] Re[facility].

## 2018-03-22 ENCOUNTER — Encounter (HOSPITAL_COMMUNITY)
Admission: RE | Admit: 2018-03-22 | Discharge: 2018-03-22 | Disposition: A | Discharge status: Home / Self Care | Payer: Medicare Other | Source: Ambulatory Visit | Attending: Pulmonary Disease | Admitting: Pulmonary Disease

## 2018-03-22 DIAGNOSIS — J449 Chronic obstructive pulmonary disease, unspecified: Secondary | ICD-10-CM

## 2018-03-22 NOTE — Progress Notes (Signed)
Daily Session Note  Patient Details  Name: Ellen Bowers MRN: [id number] Date of Birth: Dec 17, 1945 Referring Provider:     PULMONARY REHAB COPD ORIENTATION from 03/17/2018 in [place]  Referring Provider  Loanne Drilling      Encounter Date: 03/22/2018  Check In: Session Check In - 03/22/18 1045      Check-In   Supervising physician immediately available to respond to emergencies  See telemetry face sheet for immediately available MD    Location  [facility]    Staff Present  Russella Dar, MS, EP, [facility], Exercise Physiologist;Aldon Hengst Zachery Conch, Exercise Physiologist    Fall or balance concerns reported     Yes    Comments  Just a little due to chronic knee pain    Warm-up and Cool-down  Performed as group-led instruction    Resistance Training Performed  Yes    VAD Patient?  No    PAD/SET Patient?  No      Pain Assessment   Currently in Pain?  No/denies    Multiple Pain Sites  No       Capillary Blood Glucose: No results found for this or any previous visit (from the past 24 hour(s)).    Social History   Tobacco Use  Smoking Status Former Smoker  . Packs/day: 1.00  . Years: 50.00  . Pack years: 50.00  . Types: Cigarettes  . Start date: 03/17/1961  . Last attempt to quit: 05/30/2009  . Years since quitting: 8.8  Smokeless Tobacco Never Used  Tobacco Comment   Quit smoking x 7 years    Goals Met:  Proper associated with RPD/PD & O2 Sat Independence with exercise equipment Improved SOB with ADL's Using PLB without cueing & demonstrates good technique Exercise tolerated well No report of cardiac concerns or symptoms Strength training completed today  Goals Unmet:  Not Applicable  Comments: Pt able to follow exercise prescription today without complaint.  Will continue to monitor for progression. Check out 1145.   Dr. Sinda Du is Medical Director for [facility] Pul[facility].

## 2018-03-23 ENCOUNTER — Telehealth: Payer: Self-pay | Admitting: Pulmonary Disease

## 2018-03-23 NOTE — Telephone Encounter (Signed)
Called and spoke with patient she is aware and verbalized understanding no work note needed.

## 2018-03-23 NOTE — Telephone Encounter (Signed)
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 If possible, I would recommend requesting alternative work that does not involve direct patient care in the short-term. If you do continue to care for patients, I recommend personal protective equipment including gloves and face mask as deemed appropriate at your workplace.   If patient requests work note, please include details as noted above.  JE

## 2018-03-23 NOTE — Telephone Encounter (Signed)
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 She is wanting to know if she needs to stop working for a little bit since the virus is going around and with her age she could contract it.   JE please advise, thank you.

## 2018-03-24 ENCOUNTER — Other Ambulatory Visit: Payer: Self-pay

## 2018-03-24 ENCOUNTER — Encounter (HOSPITAL_COMMUNITY)
Admission: RE | Admit: 2018-03-24 | Discharge: 2018-03-24 | Disposition: A | Discharge status: Home / Self Care | Payer: Medicare Other | Source: Ambulatory Visit | Attending: Pulmonary Disease | Admitting: Pulmonary Disease

## 2018-03-24 DIAGNOSIS — J449 Chronic obstructive pulmonary disease, unspecified: Secondary | ICD-10-CM

## 2018-03-24 NOTE — Progress Notes (Signed)
Daily Session Note  Patient Details  Name: Ellen Bowers MRN: [id number] Date of Birth: Jun 23, 1945 Referring Provider:     PULMONARY REHAB COPD ORIENTATION from 03/17/2018 in [place]  Referring Provider  Loanne Drilling      Encounter Date: 03/24/2018  Check In: Session Check In - 03/24/18 1045      Check-In   Supervising physician immediately available to respond to emergencies  See telemetry face sheet for immediately available MD    Location  [facility]    Staff Present  Russella Dar, MS, EP, [facility], Exercise Physiologist;Amanda Ballard, Exercise Physiologist;Meribeth Vitug Wynetta Emery, RN, BSN    Medication changes reported      No    Fall or balance concerns reported     Yes    Comments  Just a little due to chronic knee pain    Warm-up and Cool-down  Performed as group-led instruction    Resistance Training Performed  Yes    VAD Patient?  No    PAD/SET Patient?  No      Pain Assessment   Currently in Pain?  No/denies    Multiple Pain Sites  No       Capillary Blood Glucose: No results found for this or any previous visit (from the past 24 hour(s)).    Social History   Tobacco Use  Smoking Status Former Smoker  . Packs/day: 1.00  . Years: 50.00  . Pack years: 50.00  . Types: Cigarettes  . Start date: 03/17/1961  . Last attempt to quit: 05/30/2009  . Years since quitting: 8.8  Smokeless Tobacco Never Used  Tobacco Comment   Quit smoking x 7 years    Goals Met:  Proper associated with RPD/PD & O2 Sat Independence with exercise equipment Improved SOB with ADL's Using PLB without cueing & demonstrates good technique Exercise tolerated well No report of cardiac concerns or symptoms Strength training completed today  Goals Unmet:  Not Applicable  Comments: Pt able to follow exercise prescription today without complaint.  Will continue to monitor for progression. Check out 1145.   Dr. Sinda Du is Medical Director for [facility] Pulmonary Rehab.

## 2018-03-29 ENCOUNTER — Encounter (HOSPITAL_COMMUNITY): Payer: Medicare Other

## 2018-03-30 ENCOUNTER — Ambulatory Visit: Payer: Medicare Other | Admitting: Surgery

## 2018-03-30 ENCOUNTER — Other Ambulatory Visit: Payer: Medicare Other

## 2018-03-31 ENCOUNTER — Encounter (HOSPITAL_COMMUNITY): Payer: Medicare Other

## 2018-04-04 NOTE — Progress Notes (Signed)
Pulmonary Individual Treatment Plan  Patient Details  Name: Ellen Bowers MRN: [id number] Date of Birth: 03/04/1945 Referring Provider:     PULMONARY REHAB COPD ORIENTATION from 03/17/2018 in [place]  Referring Provider  Loanne Drilling      Initial Encounter Date:    PULMONARY REHAB COPD ORIENTATION from 03/17/2018 in [place]  Date  03/17/18      Visit Diagnosis: Chronic obstructive pulmonary disease, unspecified COPD type ([place])  Patient's Home Medications on Admission:   Current Outpatient Medications:  .  acetaminophen (TYLENOL) 500 MG tablet, Take 1,000 mg by mouth 2 (two) times daily as needed for moderate pain or headache., Disp: , Rfl:  .  albuterol (PROVENTIL HFA;VENTOLIN HFA) 108 (90 Base) MCG/ACT inhaler, Inhale 2 puffs into the lungs every 6 (six) hours as needed for wheezing or shortness of breath., Disp: , Rfl:  .  alum & mag hydroxide-simeth (MAALOX/MYLANTA) 200-200-20 MG/5ML suspension, Take 15 mLs by mouth every 6 (six) hours as needed for indigestion or heartburn. , Disp: , Rfl:  .  Ascorbic Acid (VITAMIN C) 1000 MG tablet, Take 1,000 mg by mouth daily. , Disp: , Rfl:  .  azelastine (ASTELIN) 0.1 % nasal spray, Place 2 sprays into both nostrils daily as needed for rhinitis. Use in each nostril as directed, Disp: , Rfl:  .  azithromycin (ZITHROMAX) 250 MG tablet, Take 1 tablet (250 mg total) by mouth daily., Disp: 90 tablet, Rfl: 0 .  b complex vitamins tablet, Take 1 tablet by mouth daily., Disp: , Rfl:  .  budesonide (PULMICORT) 0.5 MG/2ML nebulizer solution, Take 0.5 mg by nebulization as needed (for shortness of breath/wheezing.). , Disp: , Rfl:  .  buPROPion (WELLBUTRIN SR) 200 MG 12 hr tablet, Take 200 mg by mouth 2 (two) times daily., Disp: , Rfl:  .  Calcium-Magnesium (CAL-MAG PO), Take 1 tablet by mouth at bedtime., Disp: , Rfl:  .  carboxymethylcellulose (REFRESH PLUS) 0.5 % SOLN, Place 1-2 drops into both eyes 2  (two) times daily as needed (for dry eyes). REFRESH, Disp: , Rfl:  .  cetirizine (ZYRTEC) 10 MG tablet, Take 10 mg by mouth daily., Disp: , Rfl:  .  Cholecalciferol (VITAMIN D PO), Take by mouth daily. Unsure of dose, Disp: , Rfl:  .  diclofenac (FLECTOR) 1.3 % PTCH, Place 0.25 patches onto the skin as needed. 1/4TH OF PATCH DAILY , Disp: , Rfl:  .  docusate sodium (COLACE) 100 MG capsule, Take 100 mg by mouth 2 (two) times daily. , Disp: , Rfl:  .  doxylamine, Sleep, (UNISOM) 25 MG tablet, Take 25 mg by mouth at bedtime. , Disp: , Rfl:  .  Echinacea 125 MG TABS, Take 1 tablet by mouth daily., Disp: , Rfl:  .  famotidine (PEPCID) 10 MG tablet, Take 10 mg by mouth daily. As needed, Disp: , Rfl:  .  fluticasone (FLONASE) 50 MCG/ACT nasal spray, Place 2 sprays into both nostrils daily as needed for allergies or rhinitis., Disp: , Rfl:  .  Fluticasone-Salmeterol (ADVAIR) 500-50 MCG/DOSE AEPB, Inhale 1 puff into the lungs 2 (two) times daily. , Disp: , Rfl:  .  HYDROcodone-homatropine (HYCODAN) 5-1.5 MG/5ML syrup, TAKE 1 TEASPOONFUL (5ML) BY MOUTH EVERY 6 HOURS AS NEEDED, Disp: 473 mL, Rfl: 0 .  ibuprofen (ADVIL,MOTRIN) 800 MG tablet, Take 1 tablet by mouth daily as needed, Disp: 30 tablet, Rfl: 1 .  ipratropium-albuterol (DUONEB) 0.5-2.5 (3) MG/3ML SOLN, Take 3 mLs by nebulization 3 (  three) times daily. (Patient taking differently: Take 3 mLs by nebulization 3 (three) times daily. Scheduled twice daily, may use additional dose as needed), Disp: 360 mL, Rfl: 11 .  Misc Natural Products (MIDNITE PO), Take 1 tablet by mouth at bedtime., Disp: , Rfl:  .  montelukast (SINGULAIR) 10 MG tablet, Take 10 mg by mouth daily. , Disp: , Rfl:  .  ondansetron (ZOFRAN) 4 MG tablet, Take 1 tablet (4 mg total) by mouth every 8 (eight) hours as needed for nausea or vomiting. (Patient not taking: Reported on 03/17/2018), Disp: 20 tablet, Rfl: 0 .  pantoprazole (PROTONIX) 40 MG tablet, TAKE ONE TABLET BY MOUTH TWICE DAILY - 30  MINUTES BEFORE BREAKFAST & DINNER, Disp: 60 tablet, Rfl: 6 .  pravastatin (PRAVACHOL) 20 MG tablet, Take 20 mg by mouth at bedtime. , Disp: , Rfl:  .  solifenacin (VESICARE) 5 MG tablet, Take 5 mg by mouth at bedtime. , Disp: , Rfl:  .  Zoledronic Acid (RECLAST IV), Inject into the vein. Once per year, Disp: , Rfl:   Past Medical History: Past Medical History:  Diagnosis Date  . Anxiety   . Arthritis    R knee, bilateral hips, hands & neck- being given steroid injections   . Asthma   . Blood dyscrasia   . Cancer (HCC)    L hand  . COPD (chronic obstructive pulmonary disease) ([place])   . Depression   . Dyspnea   . GERD (gastroesophageal reflux disease)   . Helicobacter pylori gastritis 2005  . Hyperlipidemia   . 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 Von Willebrand disease ([place]) 1996    Tobacco Use: Social History   Tobacco Use  Smoking Status Former Smoker  . Packs/day: 1.00  . Years: 50.00  . Pack years: 50.00  . Types: Cigarettes  . Start date: 03/17/1961  . Last attempt to quit: 05/30/2009  . Years since quitting: 8.8  Smokeless Tobacco Never Used  Tobacco Comment   Quit smoking x 7 years    Labs: Recent Review Flowsheet Data    Labs for ITP Cardiac and Pulmonary Rehab Latest Ref Rng & Units 02/04/2017 02/08/2017 02/09/2017   PHART 7.350 - 7.450 7.461(H) 7.338(L) 7.423   PCO2ART 32.0 - 48.0 mmHg 33.1 48.6(H) 37.6   HCO3 20.0 - 28.0 mmol/L 23.3 26.3 24.1   TCO2 22 - 32 mmol/L - 28 -   ACIDBASEDEF 0.0 - 2.0 mmol/L 0.1 - -   O2SAT % 96.9 95.0 98.4      Capillary Blood Glucose: Lab Results  Component Value Date   GLUCAP 119 (H) 02/10/2017   GLUCAP 147 (H) 02/09/2017   GLUCAP 136 (H) 02/09/2017   GLUCAP 141 (H) 02/09/2017   GLUCAP 131 (H) 02/09/2017     Pulmonary Assessment Scores: Pulmonary Assessment Scores    Row Name 03/17/18 1132         ADL UCSD   ADL Phase  Entry     SOB Score total  50     Rest  0     Walk  5     Stairs  3     Bath   3     Dress  2     Shop  1       CAT Score   CAT Score  22       mMRC Score   mMRC Score  3  Pulmonary Function Assessment: Pulmonary Function Assessment - 03/17/18 1125      Pulmonary Function Tests   FVC%  70 %    FEV1%  60 %    FEV1/FVC Ratio  86    RV%  79 %    DLCO%  44 %      Initial Spirometry Results   FVC%  70 %    FEV1%  60 %    FEV1/FVC Ratio  86      Post Bronchodilator Spirometry Results   FVC%  68 %    FEV1%  61 %    FEV1/FVC Ratio  68      Breath   Bilateral Breath Sounds  Wheezes    Shortness of Breath  Yes;Limiting activity       Exercise Target Goals: Exercise Program Goal: Individual exercise prescription set using results from initial 6 min walk test and THRR while considering  patient's activity barriers and safety.   Exercise Prescription Goal: Initial exercise prescription builds to 30-45 minutes a day of aerobic activity, 2-3 days per week.  Home exercise guidelines will be given to patient during program as part of exercise prescription that the participant will acknowledge.  Activity Barriers & Risk Stratification: Activity Barriers & Cardiac Risk Stratification - 03/17/18 1009      Activity Barriers & Cardiac Risk Stratification   Activity Barriers  Muscular Weakness;Shortness of Breath;Deconditioning;Other (comment)    Comments  R knee pain, bilateral hip pain     Cardiac Risk Stratification  High       6 Minute Walk: 6 Minute Walk    Row Name 03/17/18 1007         6 Minute Walk   Phase  Initial     Distance  1200 feet     Walk Time  6 minutes     # of Rest Breaks  0     MPH  2.27     METS  2.74     RPE  13     Perceived Dyspnea   14     VO2 Peak  9.64     Symptoms  Yes (comment)     Comments  2/10 R knee pain, 3/10 bilateral hip pain, lightheadedness      Resting HR  81 bpm     Resting BP  128/70     Resting Oxygen Saturation   99 %     Exercise Oxygen Saturation  during 6 min walk  91 %     Max Ex. HR   106 bpm     Max Ex. BP  160/80     2 Minute Post BP  136/76        Oxygen Initial Assessment: Oxygen Initial Assessment - 03/17/18 1124      Home Oxygen   Home Oxygen Device  None    Sleep Oxygen Prescription  None    Home Exercise Oxygen Prescription  None    Home at Rest Exercise Oxygen Prescription  None    Compliance with Home Oxygen Use  --   N/A     Initial 6 min Walk   Oxygen Used  None      Program Oxygen Prescription   Program Oxygen Prescription  None       Oxygen Re-Evaluation: Oxygen Re-Evaluation    Row Name 04/04/18 1450             Program Oxygen Prescription   Program Oxygen Prescription  None  Home Oxygen   Home Oxygen Device  None       Sleep Oxygen Prescription  None       Home Exercise Oxygen Prescription  None       Compliance with Home Oxygen Use  - N/A         Goals/Expected Outcomes   Short Term Goals  To learn and understand importance of monitoring SPO2 with pulse oximeter and demonstrate accurate use of the pulse oximeter.;To learn and understand importance of maintaining oxygen saturations>88%;To learn and demonstrate proper pursed lip breathing techniques or other breathing techniques.       Long  Term Goals  Maintenance of O2 saturations>88%;Exhibits proper breathing techniques, such as pursed lip breathing or other method taught during program session;Verbalizes importance of monitoring SPO2 with pulse oximeter and return demonstration       Comments  Patient is able to verbalize the importance of maintaining her O2Sat >88%. She is also able to demonstrate proper usage of pulse oximetry and proper pursed lip breathing techniques during the sessions.        Goals/Expected Outcomes  Patient will continue to meet both her short and long term goals.           Oxygen Discharge (Final Oxygen Re-Evaluation): Oxygen Re-Evaluation - 04/04/18 1450      Program Oxygen Prescription   Program Oxygen Prescription  None      Home Oxygen    Home Oxygen Device  None    Sleep Oxygen Prescription  None    Home Exercise Oxygen Prescription  None    Compliance with Home Oxygen Use  --   N/A     Goals/Expected Outcomes   Short Term Goals  To learn and understand importance of monitoring SPO2 with pulse oximeter and demonstrate accurate use of the pulse oximeter.;To learn and understand importance of maintaining oxygen saturations>88%;To learn and demonstrate proper pursed lip breathing techniques or other breathing techniques.    Long  Term Goals  Maintenance of O2 saturations>88%;Exhibits proper breathing techniques, such as pursed lip breathing or other method taught during program session;Verbalizes importance of monitoring SPO2 with pulse oximeter and return demonstration    Comments  Patient is able to verbalize the importance of maintaining her O2Sat >88%. She is also able to demonstrate proper usage of pulse oximetry and proper pursed lip breathing techniques during the sessions.     Goals/Expected Outcomes  Patient will continue to meet both her short and long term goals.        Initial Exercise Prescription: Initial Exercise Prescription - 03/17/18 1000      Date of Initial Exercise RX and Referring Provider   Date  03/17/18    Referring Provider  Loanne Drilling    Expected Discharge Date  06/17/18      Treadmill   MPH  1    Grade  0    Minutes  17    METs  1.88      NuStep   Level  1    SPM  40    Minutes  17    METs  1.8      Prescription Details   Frequency (times per week)  2    Duration  Progress to 30 minutes of continuous aerobic without signs/symptoms of physical distress      Intensity   THRR 40-80% of Max Heartrate  [phone]    Ratings of Perceived Exertion  11-13    Perceived Dyspnea  0-4  Progression   Progression  Continue to progress workloads to maintain intensity without signs/symptoms of physical distress.      Resistance Training   Training Prescription  Yes    Weight  1    Reps   10-15       Perform Capillary Blood Glucose checks as needed.  Exercise Prescription Changes:  Exercise Prescription Changes    Row Name 03/17/18 1000 04/04/18 1100           Response to Exercise   Blood Pressure (Admit)  128/70  130/58      Blood Pressure (Exercise)  160/80  160/80      Blood Pressure (Exit)  136/76  144/60      Heart Rate (Admit)  81 bpm  77 bpm      Heart Rate (Exercise)  106 bpm  91 bpm      Heart Rate (Exit)  78 bpm  71 bpm      Oxygen Saturation (Admit)  99 %  97 %      Oxygen Saturation (Exercise)  91 %  97 %      Oxygen Saturation (Exit)  98 %  99 %      Rating of Perceived Exertion (Exercise)  13  11      Perceived Dyspnea (Exercise)  14  12      Symptoms  R knee pain, bilateral hip pain   -      Comments  6 minute walk test  first week of exercise       Duration  Progress to 30 minutes of  aerobic without signs/symptoms of physical distress  Continue with 30 min of aerobic exercise without signs/symptoms of physical distress.      Intensity  THRR New [phone]  THRR unchanged        Progression   Progression  -  Continue to progress workloads to maintain intensity without signs/symptoms of physical distress.      Average METs  -  1.8        Resistance Training   Training Prescription  -  Yes      Weight  -  1      Reps  -  10-15        Treadmill   MPH  -  1.1      Grade  -  0      Minutes  -  17      METs  -  1.84        NuStep   Level  -  - bothered R knee.         Arm Ergometer   Level  -  1      Watts  -  10      Minutes  -  22      METs  -  1.8        Home Exercise Plan   Plans to continue exercise at  Home (comment)  Home (comment)      Frequency  Add 3 additional days to program exercise sessions.  Add 3 additional days to program exercise sessions.      Initial Home Exercises Provided  03/17/18  03/17/18         Exercise Comments:  Exercise Comments    Row Name 04/04/18 1122           Exercise Comments  Pt. is  new to [facility]. She has attended 3 sessions so far. Her knee bothered  her on the NuStep so we switched her to the Arm Ergometer.           Exercise Goals and Review:  Exercise Goals    Row Name 03/17/18 1014             Exercise Goals   Increase Physical Activity  Yes       Intervention  Provide advice, education, support and counseling about physical activity/exercise needs.;Develop an individualized exercise prescription for aerobic and resistive training based on initial evaluation findings, risk stratification, comorbidities and participant's personal goals.       Expected Outcomes  Short Term: Attend rehab on a regular basis to increase amount of physical activity.;Long Term: Add in home exercise to make exercise part of routine and to increase amount of physical activity.;Long Term: Exercising regularly at least 3-5 days a week.       Increase Strength and Stamina  Yes       Intervention  Provide advice, education, support and counseling about physical activity/exercise needs.;Develop an individualized exercise prescription for aerobic and resistive training based on initial evaluation findings, risk stratification, comorbidities and participant's personal goals.       Expected Outcomes  Short Term: Increase workloads from initial exercise prescription for resistance, speed, and METs.       Able to understand and use rate of perceived exertion (RPE) scale  Yes       Intervention  Provide education and explanation on how to use RPE scale       Expected Outcomes  Short Term: Able to use RPE daily in rehab to express subjective intensity level;Long Term:  Able to use RPE to guide intensity level when exercising independently       Able to understand and use Dyspnea scale  Yes       Intervention  Provide education and explanation on how to use Dyspnea scale       Expected Outcomes  Short Term: Able to use Dyspnea scale daily in rehab to express subjective sense of shortness of breath during  exertion;Long Term: Able to use Dyspnea scale to guide intensity level when exercising independently       Knowledge and understanding of Target Heart Rate Range (THRR)  Yes       Intervention  Provide education and explanation of THRR including how the numbers were predicted and where they are located for reference       Expected Outcomes  Short Term: Able to state/look up THRR       Able to check pulse independently  Yes       Intervention  Provide education and demonstration on how to check pulse in carotid and radial arteries.;Review the importance of being able to check your own pulse for safety during independent exercise       Expected Outcomes  Short Term: Able to explain why pulse checking is important during independent exercise;Long Term: Able to check pulse independently and accurately       Understanding of Exercise Prescription  Yes       Intervention  Provide education, explanation, and written materials on patient's individual exercise prescription       Expected Outcomes  Short Term: Able to explain program exercise prescription;Long Term: Able to explain home exercise prescription to exercise independently          Exercise Goals Re-Evaluation : Exercise Goals Re-Evaluation    [place] Name 04/04/18 1121  Exercise Goal Re-Evaluation   Exercise Goals Review  Increase Physical Activity;Able to understand and use rate of perceived exertion (RPE) scale;Knowledge and understanding of Target Heart Rate Range (THRR);Understanding of Exercise Prescription;Increase Strength and Stamina;Able to understand and use Dyspnea scale;Able to check pulse independently       Comments  Pt. is new to the program. She has attended 3 sessions so far. We switched her off of the NuStep due to knee pain, but she is tolerating the TM and Arm Ergometer well.        Expected Outcomes  ambulate better and decrease SOB.           Discharge Exercise Prescription (Final Exercise Prescription  Changes): Exercise Prescription Changes - 04/04/18 1100      Response to Exercise   Blood Pressure (Admit)  130/58    Blood Pressure (Exercise)  160/80    Blood Pressure (Exit)  144/60    Heart Rate (Admit)  77 bpm    Heart Rate (Exercise)  91 bpm    Heart Rate (Exit)  71 bpm    Oxygen Saturation (Admit)  97 %    Oxygen Saturation (Exercise)  97 %    Oxygen Saturation (Exit)  99 %    Rating of Perceived Exertion (Exercise)  11    Perceived Dyspnea (Exercise)  12    Comments  first week of exercise     Duration  Continue with 30 min of aerobic exercise without signs/symptoms of physical distress.    Intensity  THRR unchanged      Progression   Progression  Continue to progress workloads to maintain intensity without signs/symptoms of physical distress.    Average METs  1.8      Resistance Training   Training Prescription  Yes    Weight  1    Reps  10-15      Treadmill   MPH  1.1    Grade  0    Minutes  17    METs  1.84      NuStep   Level  --   bothered R knee.      Arm Ergometer   Level  1    Watts  10    Minutes  22    METs  1.8      Home Exercise Plan   Plans to continue exercise at  Home (comment)    Frequency  Add 3 additional days to program exercise sessions.    Initial Home Exercises Provided  03/17/18       Nutrition:  Target Goals: Understanding of nutrition guidelines, daily intake of sodium 1500mg , cholesterol 200mg , calories 30% from fat and 7% or less from saturated fats, daily to have 5 or more servings of fruits and vegetables.  Biometrics: Pre Biometrics - 03/17/18 1015      Pre Biometrics   Height  5\' 9"  (1.753 m)    Waist Circumference  42 inches    Hip Circumference  46 inches    Waist to Hip Ratio  0.91 %    Triceps Skinfold  24 mm    % Body Fat  41.6 %    Grip Strength  10 kg    Flexibility  0 in    Single Leg Stand  5.84 seconds        Nutrition Therapy Plan and Nutrition Goals: Nutrition Therapy & Goals - 04/04/18 1452       Nutrition Therapy   RD appointment deferred  Yes  Personal Nutrition Goals   Comments  Patient encouraged to attend RD meeting. She states she is trying to eat more fresh fruits and vegetables. Will continue to monitor.       Intervention Plan   Intervention  Nutrition handout(s) given to patient.       Nutrition Assessments: Nutrition Assessments - 03/17/18 1139      MEDFICTS Scores   Pre Score  77       Nutrition Goals Re-Evaluation:   Nutrition Goals Discharge (Final Nutrition Goals Re-Evaluation):   Psychosocial: Target Goals: Acknowledge presence or absence of significant depression and/or stress, maximize coping skills, provide positive support system. Participant is able to verbalize types and ability to use techniques and skills needed for reducing stress and depression.  Initial Review & Psychosocial Screening: Initial Psych Review & Screening - 03/17/18 1135      Initial Review   Current issues with  History of Depression      Family Dynamics   Good Support System?  Yes      Barriers   Psychosocial barriers to participate in program  The patient should benefit from training in stress management and relaxation.      Screening Interventions   Interventions  Encouraged to exercise    Expected Outcomes  Short Term goal: Identification and review with participant of any Quality of Life or Depression concerns found by scoring the questionnaire.;Long Term goal: The participant improves quality of Life and PHQ9 Scores as seen by post scores and/or verbalization of changes       Quality of Life Scores: Quality of Life - 03/17/18 1018      Quality of Life   Select  Quality of Life      Quality of Life Scores   Health/Function Pre  15.59 %    Socioeconomic Pre  21.29 %    Psych/Spiritual Pre  13.86 %    Family Pre  13.1 %    GLOBAL Pre  16.03 %      Scores of 19 and below usually indicate a poorer quality of life in these areas.  A difference of   2-3 points is a clinically meaningful difference.  A difference of 2-3 points in the total score of the Quality of Life Index has been associated with significant improvement in overall quality of life, self-image, physical symptoms, and general health in studies assessing change in quality of life.   PHQ-9: Recent Review Flowsheet Data    Depression screen [facility] 2/9 03/17/2018   Decreased Interest 1   Down, Depressed, Hopeless 0   PHQ - 2 Score 1   Altered sleeping 3   Tired, decreased energy 1   Change in appetite 2   Feeling bad or failure about yourself  0   Trouble concentrating 0   Moving slowly or fidgety/restless 0   Suicidal thoughts 0   PHQ-9 Score 7   Difficult doing work/chores Somewhat difficult     Interpretation of Total Score  Total Score Depression Severity:  1-4 = Minimal depression, 5-9 = Mild depression, 10-14 = Moderate depression, 15-19 = Moderately severe depression, 20-27 = Severe depression   Psychosocial Evaluation and Intervention: Psychosocial Evaluation - 03/17/18 1137      Psychosocial Evaluation & Interventions   Interventions  Encouraged to exercise with the program and follow exercise prescription    Continue Psychosocial Services   Follow up required by staff       Psychosocial Re-Evaluation: Psychosocial Re-Evaluation    [place] Name  04/04/18 1456             Psychosocial Re-Evaluation   Current issues with  History of Depression       Comments  Patient's initial QOL score was 16.03 and her PHQ-9 score was 7. She is currently on Wellbutrin 200 mg BID for depression. Will continue to monitor.        Expected Outcomes  Patient's depression will continue to be managed and she will have no additional psychosocial issues identified at discharge.        Interventions  Relaxation education;Encouraged to attend Pulmonary Rehabilitation for the exercise;Stress management education       Continue Psychosocial Services   Follow up required by staff           Psychosocial Discharge (Final Psychosocial Re-Evaluation): Psychosocial Re-Evaluation - 04/04/18 1456      Psychosocial Re-Evaluation   Current issues with  History of Depression    Comments  Patient's initial QOL score was 16.03 and her PHQ-9 score was 7. She is currently on Wellbutrin 200 mg BID for depression. Will continue to monitor.     Expected Outcomes  Patient's depression will continue to be managed and she will have no additional psychosocial issues identified at discharge.     Interventions  Relaxation education;Encouraged to attend Pulmonary Rehabilitation for the exercise;Stress management education    Continue Psychosocial Services   Follow up required by staff        Education: Education Goals: Education classes will be provided on a weekly basis, covering required topics. Participant will state understanding/return demonstration of topics presented.  Learning Barriers/Preferences: Learning Barriers/Preferences - 03/17/18 1139      Learning Barriers/Preferences   Learning Barriers  None       Education Topics: How Lungs Work and Diseases: - Discuss the anatomy of the lungs and diseases that can affect the lungs, such as COPD.   Exercise: -Discuss the importance of exercise, FITT principles of exercise, normal and abnormal responses to exercise, and how to exercise safely.   Environmental Irritants: -Discuss types of environmental irritants and how to limit exposure to environmental irritants.   Meds/Inhalers and oxygen: - Discuss respiratory medications, definition of an inhaler and oxygen, and the proper way to use an inhaler and oxygen.   Energy Saving Techniques: - Discuss methods to conserve energy and decrease shortness of breath when performing activities of daily living.    Bronchial Hygiene / Breathing Techniques: - Discuss breathing mechanics, pursed-lip breathing technique,  proper posture, effective ways to clear airways, and other  functional breathing techniques   Cleaning Equipment: - Provides group verbal and written instruction about the health risks of elevated stress, cause of high stress, and healthy ways to reduce stress.   PULMONARY REHAB CHRONIC OBSTRUCTIVE PULMONARY DISEASE from 03/24/2018 in [place]  Date  03/24/18  Educator  Wynetta Emery  Instruction Review Code  2- Demonstrated Understanding      Nutrition I: Fats: - Discuss the types of cholesterol, what cholesterol does to the body, and how cholesterol levels can be controlled.   Nutrition II: Labels: -Discuss the different components of food labels and how to read food labels.   Respiratory Infections: - Discuss the signs and symptoms of respiratory infections, ways to prevent respiratory infections, and the importance of seeking medical treatment when having a respiratory infection.   Stress I: Signs and Symptoms: - Discuss the causes of stress, how stress may lead to anxiety and depression, and ways to  limit stress.   Stress II: Relaxation: -Discuss relaxation techniques to limit stress.   Oxygen for Home/Travel: - Discuss how to prepare for travel when on oxygen and proper ways to transport and store oxygen to ensure safety.   Knowledge Questionnaire Score: Knowledge Questionnaire Score - 03/17/18 1013      Knowledge Questionnaire Score   Pre Score  16/18       Core Components/Risk Factors/Patient Goals at Admission: Personal Goals and Risk Factors at Admission - 03/17/18 1141      Core Components/Risk Factors/Patient Goals on Admission    Weight Management  Yes    Intervention  Weight Management: Develop a combined nutrition and exercise program designed to reach desired caloric intake, while maintaining appropriate intake of nutrient and fiber, sodium and fats, and appropriate energy expenditure required for the weight goal.    Admit Weight  206 lb 8 oz (93.7 kg)    Goal Weight: Short Term  196 lb 8 oz  (89.1 kg)    Goal Weight: Long Term  186 lb 8 oz (84.6 kg)    Personal Goal Other  Yes    Personal Goal  Lose 20lbs, ambulate better w/SOB, increase endurance     Intervention  Attend PR 2 x week, supplement with 3 x week at home exercise.     Expected Outcomes  Reach personal goal.        Core Components/Risk Factors/Patient Goals Review:  Goals and Risk Factor Review    Row Name 04/04/18 1453             Core Components/Risk Factors/Patient Goals Review   Personal Goals Review  Weight Management/Obesity;Improve shortness of breath with ADL's Lose 20 lbs; ambulate better w/o SOB; increase endurance.        Review  Patient has completed 3 sessions maintaining her weight since she started the program. She is doing well in the program. She says she has only had 2 sessions and has not noticed much progress yet. Outpatient pulmonary services was suspended 03/28/18 due to COVID-19 restrictions. Patient says she plans to walk to her mailbox everyday which is on an incline to remain active. Will continue to monitor.        Expected Outcomes  Patient will continue to attend sessions and complete the program meeting her personal goald.           Core Components/Risk Factors/Patient Goals at Discharge (Final Review):  Goals and Risk Factor Review - 04/04/18 1453      Core Components/Risk Factors/Patient Goals Review   Personal Goals Review  Weight Management/Obesity;Improve shortness of breath with ADL's   Lose 20 lbs; ambulate better w/o SOB; increase endurance.    Review  Patient has completed 3 sessions maintaining her weight since she started the program. She is doing well in the program. She says she has only had 2 sessions and has not noticed much progress yet. Outpatient pulmonary services was suspended 03/28/18 due to COVID-19 restrictions. Patient says she plans to walk to her mailbox everyday which is on an incline to remain active. Will continue to monitor.     Expected Outcomes   Patient will continue to attend sessions and complete the program meeting her personal goald.        ITP Comments: ITP Comments    Row Name 04/04/18 1449           ITP Comments  Pulmonary rehab services was suspended 03/28/18 due to COVID-19 restrictions and will resume when restrictions  are lifted.           Comments: ITP REVIEW Pt is making expected progress toward pulmonary rehab goals after completing 3 sessions. Recommend continued exercise, life style modification, education, and utilization of breathing techniques to increase stamina and strength and decrease shortness of breath with exertion.  Pulmonary rehab services was suspended 03/28/18 due to COVID-19 restrictions. Services will resume when restrictions are lifted. Will continue to monitor.

## 2018-04-05 ENCOUNTER — Encounter (INDEPENDENT_AMBULATORY_CARE_PROVIDER_SITE_OTHER): Payer: Self-pay | Admitting: Orthopaedic Surgery

## 2018-04-05 ENCOUNTER — Encounter (HOSPITAL_COMMUNITY): Payer: Medicare Other

## 2018-04-05 ENCOUNTER — Ambulatory Visit (INDEPENDENT_AMBULATORY_CARE_PROVIDER_SITE_OTHER): Payer: Medicare Other | Admitting: Orthopaedic Surgery

## 2018-04-05 ENCOUNTER — Other Ambulatory Visit: Payer: Self-pay

## 2018-04-05 VITALS — BP 154/74 | HR 83 | Ht 69.0 in | Wt 205.0 lb

## 2018-04-05 DIAGNOSIS — M1711 Unilateral primary osteoarthritis, right knee: Secondary | ICD-10-CM

## 2018-04-05 MED ORDER — LIDOCAINE HCL 1 % IJ SOLN
2.0000 mL | INTRAMUSCULAR | Status: AC | PRN
  Administered 2018-04-05: 2 mL

## 2018-04-05 MED ORDER — DICLOFENAC SODIUM 1 % TD GEL: 4.0000 g | Freq: Four times a day (QID) | TRANSDERMAL | 4 refills | Status: AC

## 2018-04-05 MED ORDER — BUPIVACAINE HCL 0.5 % IJ SOLN
2.0000 mL | INTRAMUSCULAR | Status: AC | PRN
  Administered 2018-04-05: 2 mL via INTRA_ARTICULAR

## 2018-04-05 MED ORDER — METHYLPREDNISOLONE ACETATE 40 MG/ML IJ SUSP
80.0000 mg | INTRAMUSCULAR | Status: AC | PRN
  Administered 2018-04-05: 80 mg via INTRA_ARTICULAR

## 2018-04-05 NOTE — Progress Notes (Signed)
Office Visit Note   Patient: Ellen Bowers           Date of Birth: 08-27-1945           MRN: [id number] Visit Date: 04/05/2018              Requested by: Ellen Blitz, MD [address] [place], [place] [address] PCP: Ellen Blitz, MD   Assessment & Plan: Visit Diagnoses:  1. Unilateral primary osteoarthritis, right knee     Plan: Cortisone injection will through the lateral compartment right knee.  Prior films demonstrated advanced osteoarthritis.  Last injection was 3 months ago with good relief  Follow-Up Instructions: Return if symptoms worsen or fail to improve.   Orders:  Orders Placed This Encounter  Procedures  . Large Joint Inj: R knee   Meds ordered this encounter  Medications  . diclofenac sodium (VOLTAREN) 1 % GEL    Sig: Apply 4 g topically 4 (four) times daily.    Dispense:  3 Tube    Refill:  4      Procedures: Large Joint Inj: R knee on 04/05/2018 10:36 AM Indications: pain and diagnostic evaluation Details: 25 G 1.5 in needle, anteromedial approach  Arthrogram: No  Medications: 2 mL lidocaine 1 %; 2 mL bupivacaine 0.5 %; 80 mg methylPREDNISolone acetate 40 MG/ML Procedure, treatment alternatives, risks and benefits explained, specific risks discussed. Consent was given by the patient. Immediately prior to procedure a time out was called to verify the correct patient, procedure, equipment, support staff and site/side marked as required. Patient was prepped and draped in the usual sterile fashion.       Clinical Data: No additional findings.   Subjective: Chief Complaint  Patient presents with  . Right Knee - Pain  Patient presents for a right knee pain follow up. She was last seen on 12/29/17 and received a cortisone injection. Patient states that the injection helped, but has been hurting again for about a month. She is taking Ibuprofen twice daily. Patient is wanting to get a script for voltaren gel.   HPI  Review of Systems   Objective:  Vital Signs: BP (!) 154/74   Pulse 83   Ht 5\' 9"  (1.753 m)   Wt 205 lb (93 kg)   BMI 30.27 kg/m   Physical Exam Constitutional:      Appearance: She is well-developed.  Eyes:     Pupils: Pupils are equal, round, and reactive to light.  Pulmonary:     Effort: Pulmonary effort is normal.  Skin:    General: Skin is warm and dry.  Neurological:     Mental Status: She is alert and oriented to person, place, and time.  Psychiatric:        Behavior: Behavior normal.     Ortho Exam awake alert and oriented x3.  Has history of COPD and wearing a mask and gloves related to the coronavirus.  Increased valgus right knee without effusion.  Predominately posterior lateral and lateral joint pain.  Lacks just a few degrees to full extension and flexed over 100 degrees without instability.  No calf pain.  No popliteal pain  Specialty Comments:  No specialty comments available.  Imaging: No results found.   PMFS History: Patient Active Problem List   Diagnosis Date Noted  . Chronic pain of right knee 12/29/2017  . 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 Unilateral primary osteoarthritis, right knee 08/18/2017  . Lung cancer, lower lobe ([place]) 02/08/2017  .  Dyspepsia   . Anorectal fissure 10/24/2014  . History of colon polyps 10/24/2014  . History of pneumonia 06/07/2014  . COPD mixed type ([place]) 06/07/2014  . Hemorrhoids 05/07/2014  . GERD (gastroesophageal reflux disease) 09/06/2013  . Chronic cough 09/06/2013   Past Medical History:  Diagnosis Date  . Anxiety   . Arthritis    R knee, bilateral hips, hands & neck- being given steroid injections   . Asthma   . Blood dyscrasia   . Cancer (HCC)    L hand  . COPD (chronic obstructive pulmonary disease) ([place])   . Depression   . Dyspnea   . GERD (gastroesophageal reflux disease)   . Helicobacter pylori gastritis 2005  . Hyperlipidemia   . 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 Von Willebrand disease ([place]) 1996     Family History  Problem Relation Age of Onset  . Colon cancer Mother        Less than age 59  . Colon cancer Father        More than age 34, died from metastatic disease at age 94  . Liver disease Neg Hx     Past Surgical History:  Procedure Laterality Date  . BIOPSY  12/08/2016   Procedure: BIOPSY;  Surgeon: Ellen Binder, MD;  [facility];  Service: Endoscopy;;  . BRAVO [place] STUDY N/A 12/08/2016   Procedure: BRAVO [place]; Bravo Capsule Placement;  Surgeon: Ellen Binder, MD;  [facility];  Service: Endoscopy;  Laterality: N/A;  . BREAST LUMPECTOMY Left    benign  . CATARACT EXTRACTION     double  . COLONOSCOPY N/A 05/30/2012   Dr.Mann:Multiple polyps removed from the right colon-see description above; otherwise normal colonoscopy up to the cecum. 5 polyps, multiple tubular adenomas. next tcs 05/2015  . COLONOSCOPY N/A 12/13/2015   Procedure: COLONOSCOPY;  Surgeon: Ellen Binder, MD;  [facility];  Service: Endoscopy;  Laterality: N/A;  8:30 am  . ESOPHAGOGASTRODUODENOSCOPY  2012   Dr. Collene Bowers: few erosions in duodenal bulb. BRAVO off PPI ("severe reflux")  . ESOPHAGOGASTRODUODENOSCOPY N/A 06/12/2014   SLF: 1. The mucosa of the esophagus appeared normal 2. Mild non-erosive gastrtitis.   [name] ESOPHAGOGASTRODUODENOSCOPY (EGD) WITH PROPOFOL N/A 12/08/2016   Procedure: ESOPHAGOGASTRODUODENOSCOPY (EGD) WITH PROPOFOL;  Surgeon: Ellen Binder, MD;  [facility];  Service: Endoscopy;  Laterality: N/A;  7:30am  . EYE SURGERY Bilateral    L& R  . FLEXIBLE SIGMOIDOSCOPY N/A 02/05/2014   Dr. Oneida Bowers: 5, 2-3 mm sessile sigmoid colon polyps removed, small internal hemorrhoids, moderate external hemorrhoids, 2 anal fissures present. Polyps were hyperplastic.   [name] NECK SURGERY  1951   cyst removal  . NECK SURGERY  1998   growth removed  . THORACOTOMY Right 02/08/2017   Procedure: THORACOTOMY MAJOR;  Surgeon: Ellen Pollack, MD;  [facility];   Service: Thoracic;  Laterality: Right;  . TONSILLECTOMY  1951  . TUBAL LIGATION  1984  . VIDEO ASSISTED THORACOSCOPY (VATS)/WEDGE RESECTION Right 02/08/2017   Procedure: VIDEO ASSISTED THORACOSCOPY (VATS)/WEDGE RESECTION;  Surgeon: Ellen Pollack, MD;  Location: [facility] OR;  Service: Thoracic;  Laterality: Right;   Social History   Occupational History  . Not on file  Tobacco Use  . Smoking status: Former Smoker    Packs/day: 1.00    Years: 50.00    Pack years: 50.00    Types: Cigarettes  Start date: 03/17/1961    Last attempt to quit: 05/30/2009    Years since quitting: 8.8  . Smokeless tobacco: Never Used  . Tobacco comment: Quit smoking x 7 years  Substance and Sexual Activity  . Alcohol use: Yes    Comment: Occasionally    . Drug use: No  . Sexual activity: Not on file

## 2018-04-07 ENCOUNTER — Encounter (HOSPITAL_COMMUNITY): Payer: Medicare Other

## 2018-04-12 ENCOUNTER — Encounter (HOSPITAL_COMMUNITY): Payer: Medicare Other

## 2018-04-13 ENCOUNTER — Other Ambulatory Visit: Payer: Medicare Other

## 2018-04-13 ENCOUNTER — Other Ambulatory Visit: Payer: Self-pay

## 2018-04-13 ENCOUNTER — Ambulatory Visit
Admission: RE | Admit: 2018-04-13 | Discharge: 2018-04-13 | Disposition: A | Discharge status: Home / Self Care | Payer: Medicare Other | Source: Ambulatory Visit | Attending: Surgery | Admitting: Surgery

## 2018-04-13 DIAGNOSIS — C3431 Malignant neoplasm of lower lobe, right bronchus or lung: Secondary | ICD-10-CM

## 2018-04-14 ENCOUNTER — Encounter (HOSPITAL_COMMUNITY): Payer: Medicare Other

## 2018-04-18 NOTE — Progress Notes (Signed)
Virtual Visit via Telephone Note  I connected with Ellen Bowers on 04/18/18 at  9:00 AM EDT by telephone and verified that I am speaking with the correct person using two identifiers.   I discussed the limitations, risks, security and privacy concerns of performing an evaluation and management service by telephone and the availability of in person appointments. I also discussed with the patient that there may be a patient responsible charge related to this service. The patient expressed understanding and agreed to proceed.   History of Present Illness: 73 year old female, former smoker quit 2011 (50 pack year hx). 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 Patient of Dr. Loanne Drilling, last seen on 01/27/18 (previously seen by Dr. Lenna Gilford). Maintained on Advair 500 two puffs bid, Duoneb/pulmicort nebulizer 2 times daily and singulair. Started on daily Azithromycin 250mg  during last visit. Referred to pulmonary rehab.    04/19/2018 Called today for 3 month follow-up. Since starting azithromycin she has felt well. Feels its working for her. Cough has dramatically improved. She hasn't needed to use cough syrup. Still gets short of breath with exertion. Attended two pulmonary rehab classes and then they closed d/t COVID. Feels it will be beneficial for her. PFTs in Jan showed moderate obstruction, but overall unchanged from previous year. Asking for refill azithromycin [facility]  Observations/Objective:  - No shortness of breath, wheezing or cough  Chest imaging:  CT 09-22-17 - Emphysema. S/p RLL wedge resection.   PFT: 01/26/17-FEV1/FVC 71 FEV1 (1.97L) 71% FVC 2.86 (78%). Normal lung volumes including TLC. Moderately reduced DLCO.  Ambulatory O2 does not indicate need for oxygen. Saturations above 88% at rest and on exertion.  Assessment and Plan:  COPD: - Continue Advair twice daily - Continue Duoneb  twice daily - Pulmicort as needed twice daily for shortness of breath - Continue Singulair - Continue Azithromycin 250mg  daily (RX [facility])  Drug monitoring: - Needs EKG at next visit to monitor QTC interval while on daily azithromycin  - EKG from April 2019 showed NSR    Follow Up Instructions:  2-3 months with Dr. Loanne Drilling    I discussed the assessment and treatment plan with the patient. The patient was provided an opportunity to ask questions and all were answered. The patient agreed with the plan and demonstrated an understanding of the instructions.   The patient was advised to call back or seek an in-person evaluation if the symptoms worsen or if the condition fails to improve as anticipated.  I provided 25 minutes of non-face-to-face time during this encounter.   [name], NP

## 2018-04-19 ENCOUNTER — Other Ambulatory Visit: Payer: Self-pay

## 2018-04-19 ENCOUNTER — Ambulatory Visit (INDEPENDENT_AMBULATORY_CARE_PROVIDER_SITE_OTHER): Payer: Medicare Other | Admitting: Primary Care

## 2018-04-19 ENCOUNTER — Ambulatory Visit: Payer: Medicare Other | Admitting: Pulmonary Disease

## 2018-04-19 ENCOUNTER — Telehealth: Payer: Self-pay | Admitting: Pulmonary Disease

## 2018-04-19 ENCOUNTER — Encounter (HOSPITAL_COMMUNITY): Payer: Medicare Other

## 2018-04-19 ENCOUNTER — Encounter: Payer: Self-pay | Admitting: Primary Care

## 2018-04-19 DIAGNOSIS — J449 Chronic obstructive pulmonary disease, unspecified: Secondary | ICD-10-CM

## 2018-04-19 MED ORDER — AZITHROMYCIN 250 MG PO TABS: 250.0000 mg | ORAL_TABLET | Freq: Every day | ORAL | 1 refills | Status: DC

## 2018-04-19 NOTE — Telephone Encounter (Signed)
[facility] returned to [facility], spoke with Sonia Side. Verbal order given by NP Volanda Napoleon to use COPD as dx for zpack. Confirmed with Sonia Side. Nothing further is needed at this time.

## 2018-04-19 NOTE — Patient Instructions (Addendum)
COPD: Continue Advair twice daily Continue Duoneb twice daily Pulmicort as needed twice daily for shortness of breath Continue Singulair Continue Azithromycin 250mg  daily (RX sent [facility])  Needs EKG at next visit to monitor QTC   Follow up in 2-3 months with Dr. Loanne Drilling    COPD and Physical Activity Chronic obstructive pulmonary disease (COPD) is a long-term (chronic) condition that affects the lungs. COPD is a general term that can be used to describe many different lung problems that cause lung swelling (inflammation) and limit airflow, including chronic bronchitis and emphysema. The main symptom of COPD is shortness of breath, which makes it harder to do even simple tasks. This can also make it harder to exercise and be active. Talk with your health care provider about treatments to help you breathe better and actions you can take to prevent breathing problems during physical activity. What are the benefits of exercising with COPD? Exercising regularly is an important part of a healthy lifestyle. You can still exercise and do physical activities even though you have COPD. Exercise and physical activity improve your shortness of breath by increasing blood flow (circulation). This causes your heart to pump more oxygen through your body. Moderate exercise can improve your:  Oxygen use.  Energy level.  Shortness of breath.  Strength in your breathing muscles.  Heart health.  Sleep.  Self-esteem and feelings of self-worth.  Depression, stress, and anxiety levels. Exercise can benefit everyone with COPD. The severity of your disease may affect how hard you can exercise, especially at first, but everyone can benefit. Talk with your health care provider about how much exercise is safe for you, and which activities and exercises are safe for you. What actions can I take to prevent breathing problems during physical activity?  Sign up for a pulmonary rehabilitation program.  This type of program may include: ? Education about lung diseases. ? Exercise classes that teach you how to exercise and be more active while improving your breathing. This usually involves:  Exercise using your lower extremities, such as a stationary bicycle.  About 30 minutes of exercise, 2 to 5 times per week, for 6 to 12 weeks  Strength training, such as push ups or leg lifts. ? Nutrition education. ? Group classes in which you can talk with others who also have COPD and learn ways to manage stress.  If you use an oxygen tank, you should use it while you exercise. Work with your health care provider to adjust your oxygen for your physical activity. Your resting flow rate is different from your flow rate during physical activity.  While you are exercising: ? Take slow breaths. ? Pace yourself and do not try to go too fast. ? Purse your lips while breathing out. Pursing your lips is similar to a kissing or whistling position. ? If doing exercise that uses a quick burst of effort, such as weight lifting:  Breathe in before starting the exercise.  Breathe out during the hardest part of the exercise (such as raising the weights). Where to find support You can find support for exercising with COPD from:  Your health care provider.  A pulmonary rehabilitation program.  Your local health department or community health programs.  Support groups, online or in-person. Your health care provider may be able to recommend support groups. Where to find more information You can find more information about exercising with COPD from:  American Lung Association: ClassInsider.se.  COPD Foundation: https://www.rivera.net/. Contact a health care provider  if:  Your symptoms get worse.  You have chest pain.  You have nausea.  You have a fever.  You have trouble talking or catching your breath.  You want to start a new exercise program or a new activity. Summary  COPD is a general term that can  be used to describe many different lung problems that cause lung swelling (inflammation) and limit airflow. This includes chronic bronchitis and emphysema.  Exercise and physical activity improve your shortness of breath by increasing blood flow (circulation). This causes your heart to provide more oxygen to your body.  Contact your health care provider before starting any exercise program or new activity. Ask your health care provider what exercises and activities are safe for you. This information is not intended to replace advice given to you by your health care provider. Make sure you discuss any questions you have with your health care provider. Document Released: 01/21/2017 Document Revised: 01/21/2017 Document Reviewed: 01/21/2017 Elsevier Interactive Patient Education  2019 [place] (COVID-19) Are you at risk?  Are you at risk for the Coronavirus (COVID-19)?  To be considered HIGH RISK for Coronavirus (COVID-19), you have to meet the following criteria:  . Traveled to [place], [place], [place], [place] or [place]; or in the [place] to [place], [place], [place], or [place]; and have fever, cough, and shortness of breath within the last 2 weeks of travel OR . Been in close contact with a person diagnosed with COVID-19 within the last 2 weeks and have fever, cough, and shortness of breath . IF YOU DO NOT MEET THESE CRITERIA, YOU ARE CONSIDERED LOW RISK FOR COVID-19.  What to do if you are HIGH RISK for COVID-19?  [name] If you are having a medical emergency, call 911. . Seek medical care right away. Before you go to a doctor's office, urgent care or emergency department, call ahead and tell them about your recent travel, contact with someone diagnosed with COVID-19, and your symptoms. You should receive instructions from your physician's office regarding next steps of care.  . When you arrive at healthcare provider, tell the healthcare staff immediately you have  returned from visiting [place], [place], [place], [place] or [place]; or traveled in the [place] to [place], [place], [place], or [place]; in the last two weeks or you have been in close contact with a person diagnosed with COVID-19 in the last 2 weeks.   . Tell the health care staff about your symptoms: fever, cough and shortness of breath. . After you have been seen by a medical provider, you will be either: o Tested for (COVID-19) and discharged home on quarantine except to seek medical care if symptoms worsen, and asked to  - Stay home and avoid contact with others until you get your results (4-5 days)  - Avoid travel on public transportation if possible (such as bus, train, or airplane) or o Sent to the Emergency Department by EMS for evaluation, COVID-19 testing, and possible admission depending on your condition and test results.  What to do if you are LOW RISK for COVID-19?  Reduce your risk of any infection by using the same precautions used for avoiding the common cold or flu:  [name] Wash your hands often with soap and warm water for at least 20 seconds.  If soap and water are not readily available, use an alcohol-based hand sanitizer with at least 60% alcohol.  . If coughing or sneezing, cover your mouth  and nose by coughing or sneezing into the elbow areas of your shirt or coat, into a tissue or into your sleeve (not your hands). . Avoid shaking hands with others and consider head nods or verbal greetings only. . Avoid touching your eyes, nose, or mouth with unwashed hands.  . Avoid close contact with people who are sick. . Avoid places or events with large numbers of people in one location, like concerts or sporting events. . Carefully consider travel plans you have or are making. . If you are planning any travel outside or inside the [place], visit the CDC's Travelers' Health webpage for the latest health notices. . If you have some symptoms but not all symptoms, continue to  monitor at home and seek medical attention if your symptoms worsen. . If you are having a medical emergency, call 911.   [place] / e-Visit: [email or web address]         [facility]: [facility]: [phone]                   [facility]: [phone]

## 2018-04-21 ENCOUNTER — Encounter (HOSPITAL_COMMUNITY): Payer: Medicare Other

## 2018-04-25 ENCOUNTER — Telehealth (HOSPITAL_COMMUNITY): Payer: Self-pay

## 2018-04-25 NOTE — Telephone Encounter (Signed)
Patient returned phone call. She says she is doing well. She is using weights and bands for exercise and doing yard work. Informed her we would be sending out videos to help her with her exercise routine. She said this would be very helpful. She is looking forward to returning to the program. Will follow-up soon.

## 2018-04-26 ENCOUNTER — Encounter (HOSPITAL_COMMUNITY): Payer: Medicare Other

## 2018-04-27 ENCOUNTER — Telehealth (INDEPENDENT_AMBULATORY_CARE_PROVIDER_SITE_OTHER): Payer: Medicare Other | Admitting: Surgery

## 2018-04-27 ENCOUNTER — Other Ambulatory Visit: Payer: Self-pay | Admitting: *Deleted

## 2018-04-27 DIAGNOSIS — C3431 Malignant neoplasm of lower lobe, right bronchus or lung: Secondary | ICD-10-CM

## 2018-04-27 DIAGNOSIS — R911 Solitary pulmonary nodule: Secondary | ICD-10-CM

## 2018-04-27 NOTE — Telephone Encounter (Signed)
[address]       [name] [address]             [place] TELEPHONE VIRTUAL OFFICE NOTE  PCP is [name], MD   HPI:  I spoke with Ellen Bowers (DOB 06-29-1945 ) via telephone on 04/27/2018 at 12:33 PM and verified that I was speaking with the correct person using more than one form of identification.  We discussed the reason(s) for conducting our visit virtually instead of in-person.  The patient expressed understanding the circumstances and agreed to proceed as described.   Ellen Bowers is a 73 year old woman who underwent right VATS and muscle sparing thoracotomy for wedge resection of a right lower lobe squamous cell carcinomaon 02/08/2017.The final pathology showed a T1b moderately differentiated invasive squamous cell carcinoma measuring 1.8 cm with negative resection margins. I last saw her on 09/22/2017 and a CT scan of the chest at that time showed no radiographic evidence of recurrent lung cancer and no new nodules. Her preop PET scan did show a 9 mm irregular opacity in the posterior right upper lobe that was felt to most likely be a scar although it did have low level FDG uptake with an SUV max of 1.7. This opacity was still there and appeared unchanged to me.  We made plans to see her back in 6 months with a follow-up chest CT.  Her chest CT was done on 04/13/2018 but her office visit was changed to a telemedicine visit due to the COVID-19 pandemic.  She has been feeling fairly well overall but still has some exertional shortness of breath due to her moderate COPD.  She has been followed by pulmonary medicine.  She has a chronic cough that is been improved on azithromycin.   Current Outpatient Medications  Medication Sig Dispense Refill  . acetaminophen (TYLENOL) 500 MG tablet Take 1,000 mg by mouth 2 (two) times daily as needed for moderate pain or headache.    . albuterol (PROVENTIL HFA;VENTOLIN HFA) 108 (90  Base) MCG/ACT inhaler Inhale 2 puffs into the lungs every 6 (six) hours as needed for wheezing or shortness of breath.    [name] alum & mag hydroxide-simeth (MAALOX/MYLANTA) 200-200-20 MG/5ML suspension Take 15 mLs by mouth every 6 (six) hours as needed for indigestion or heartburn.     . Ascorbic Acid (VITAMIN C) 1000 MG tablet Take 1,000 mg by mouth daily.     [name] azelastine (ASTELIN) 0.1 % nasal spray Place 2 sprays into both nostrils daily as needed for rhinitis. Use in each nostril as directed    . azithromycin (ZITHROMAX) 250 MG tablet Take 1 tablet (250 mg total) by mouth daily. 90 tablet 1  . b complex vitamins tablet Take 1 tablet by mouth daily.    . budesonide (PULMICORT) 0.5 MG/2ML nebulizer solution Take 0.5 mg by nebulization as needed (for shortness of breath/wheezing.).     [name] buPROPion (WELLBUTRIN SR) 200 MG 12 hr tablet Take 200 mg by mouth 2 (two) times daily.    . Calcium-Magnesium (CAL-MAG PO) Take 1 tablet by mouth at bedtime.    . carboxymethylcellulose (REFRESH PLUS) 0.5 % SOLN Place 1-2 drops into both eyes 2 (two) times daily as needed (for dry eyes). REFRESH    . cetirizine (ZYRTEC) 10 MG tablet Take 10 mg by mouth daily.    . Cholecalciferol (VITAMIN D PO) Take by mouth daily. Unsure of dose    .  diclofenac (FLECTOR) 1.3 % PTCH Place 0.25 patches onto the skin as needed. 1/4TH OF PATCH DAILY     . diclofenac sodium (VOLTAREN) 1 % GEL Apply 4 g topically 4 (four) times daily. 3 Tube 4  . docusate sodium (COLACE) 100 MG capsule Take 100 mg by mouth 2 (two) times daily.     [name] doxylamine, Sleep, (UNISOM) 25 MG tablet Take 12.5 mg by mouth at bedtime.     . Echinacea 125 MG TABS Take 1 tablet by mouth daily.    . famotidine (PEPCID) 10 MG tablet Take 10 mg by mouth daily. As needed    . fluticasone (FLONASE) 50 MCG/ACT nasal spray Place 2 sprays into both nostrils daily as needed for allergies or rhinitis.    . Fluticasone-Salmeterol (ADVAIR) 500-50 MCG/DOSE AEPB Inhale 1 puff into  the lungs 2 (two) times daily.     [name] HYDROcodone-homatropine (HYCODAN) 5-1.5 MG/5ML syrup TAKE 1 TEASPOONFUL (5ML) BY MOUTH EVERY 6 HOURS AS NEEDED 473 mL 0  . ibuprofen (ADVIL,MOTRIN) 800 MG tablet Take 1 tablet by mouth daily as needed 30 tablet 1  . ipratropium-albuterol (DUONEB) 0.5-2.5 (3) MG/3ML SOLN Take 3 mLs by nebulization 3 (three) times daily. (Patient taking differently: Take 3 mLs by nebulization 3 (three) times daily. Scheduled twice daily, may use additional dose as needed) 360 mL 11  . Misc Natural Products (MIDNITE PO) Take 1 tablet by mouth at bedtime.    . montelukast (SINGULAIR) 10 MG tablet Take 10 mg by mouth daily.     . ondansetron (ZOFRAN) 4 MG tablet Take 1 tablet (4 mg total) by mouth every 8 (eight) hours as needed for nausea or vomiting. 20 tablet 0  . pantoprazole (PROTONIX) 40 MG tablet TAKE ONE TABLET BY MOUTH TWICE DAILY - 30 MINUTES BEFORE BREAKFAST & DINNER 60 tablet 6  . pravastatin (PRAVACHOL) 20 MG tablet Take 20 mg by mouth at bedtime.     . solifenacin (VESICARE) 5 MG tablet Take 5 mg by mouth at bedtime.     . Zoledronic Acid (RECLAST IV) Inject into the vein. Once per year     No current facility-administered medications for this visit.      Diagnostic Tests:  CLINICAL DATA:  Right lower lobe squamous cell lung carcinoma status post right lower lobe wedge resection 02/08/2017. Restaging.  EXAM: CT CHEST WITHOUT CONTRAST  TECHNIQUE: Multidetector CT imaging of the chest was performed following the standard protocol without IV contrast.  COMPARISON:  09/22/2017 chest CT.  FINDINGS: Cardiovascular: Normal heart size. No significant pericardial effusion/thickening. Three-vessel coronary atherosclerosis. Atherosclerotic nonaneurysmal thoracic aorta. Stable top-normal caliber main pulmonary artery (3.0 cm diameter).  Mediastinum/Nodes: No discrete thyroid nodules. Unremarkable esophagus. No axillary adenopathy. Mildly enlarged 1.3 cm  right paratracheal node (series 2/image 52), stable. No new pathologically enlarged mediastinal nodes. No discrete hilar adenopathy on this noncontrast scan.  Lungs/Pleura: No pneumothorax. Moderate centrilobular and mild paraseptal emphysema with mild diffuse bronchial wall thickening. Trace dependent right pleural effusion is stable. No left pleural effusion. Status post right lower lobe wedge resection. Nodular 2.2 x 1.0 cm focus of consolidation along the wedge resection suture line in the posterior right lower lobe (series 5/image 103), previously 1.4 x 0.8 cm on 09/22/2017 CT, mildly increased. New 5 mm solid peripheral apical left upper lobe pulmonary nodule (series 5/image 25). Subsolid posterior right upper lobe 1.0 cm pulmonary nodule (series 5/image 30) stable since 09/22/2017, minimally increased from 0.8 cm on 06/13/2014 chest CT, correlating with  the site of low level metabolism described on 11/27/2016 PET-CT. No additional significant pulmonary nodules.  Upper abdomen: Simple anterior liver 1.7 cm cyst.  Musculoskeletal: No aggressive appearing focal osseous lesions. Healed post thoracotomy changes in lateral right mid ribs. Mild thoracic spondylosis. Superficial subcutaneous 1.4 cm posterior left shoulder nodule (series 2/image 32), stable.  IMPRESSION: 1. Nodular 2.2 x 1.0 cm focus of consolidation along the wedge resection suture line in the posterior right lower lobe is mildly increased since 09/22/2017 chest CT. Differential includes evolving nodular postsurgical scarring versus early tumor recurrence. Management options include short-term follow-up chest CT in 3 months versus further evaluation with PET-CT at this time, as clinically warranted. 2. New 5 mm solid apical left upper lobe pulmonary nodule, which warrants attention on follow-up chest CT in 3 months. 3. Stable trace dependent right pleural effusion. 4. Subsolid 1.0 cm posterior right upper lobe  pulmonary nodule, stable since 09/22/2017 chest CT, minimally increased since 06/13/2014 chest CT, correlating with a site of low level metabolism described on 11/27/2016 PET-CT. Indolent adenocarcinoma could have this appearance. Continued chest CT surveillance is warranted for this nodule. 5. No new thoracic adenopathy. Mild right paratracheal adenopathy is stable since 2018 PET-CT, where it was non FDG avid, most compatible with benign adenopathy.  Aortic Atherosclerosis (ICD10-I70.0) and Emphysema (ICD10-J43.9).   Electronically Signed   By: Ilona Sorrel M.D.   On: 04/13/2018 13:44   Impression:  Clinically she is doing well.  I have personally reviewed her CT scan of the chest from 04/13/2018.  There is a nodular focus of consolidation along the wedge resection staple line in the posterior right lower lobe that measures about 2.2 x 1.0 cm and is immediately adjacent to the chest wall.  Radiology thought this had increased slightly in size compared to the prior chest CT but looks about the same to me.  There is a sub-solid 1.0 cm posterior right upper lobe nodule that has not changed from the prior CT of 09/22/2017 and has minimally increased in size since the CT scan of 06/13/2014.  This lesion did have low level hypermetabolic activity on the PET scan in 2018 so I do not think we could completely rule out a very slow-growing adenocarcinoma although this lesion appears stable from her CT scan last September.  There is also a new 5 mm solid nodule in the left upper lobe.  This is too small to characterize further other than to say that it is new.  There is no new thoracic adenopathy.  I think the best option at this time is to do a follow-up CT scan of the chest in 3 months to reevaluate these areas.  I reviewed the CT scan results with her and answered all of her questions.  She is in agreement with doing a follow-up CT scan in 3 months.  Plan:  We will repeat her CT scan of the chest in 3  months and I will plan to see her back in the office at that time if possible or will do a telemedicine visit to review the results.    I discussed limitations of evaluation and management via telephone.  The patient was advised to call back for repeat telephone consultation or to seek an in-person evaluation if questions arise or the patient's clinical condition changes in any significant manner.  I spent 10 minutes of non-face-to-face time during the conduct of this telephone virtual office consultation.    Gaye Pollack, MD 04/27/2018 12:33 PM

## 2018-04-28 ENCOUNTER — Ambulatory Visit: Payer: Medicare Other | Admitting: Pulmonary Disease

## 2018-04-28 ENCOUNTER — Encounter (HOSPITAL_COMMUNITY): Payer: Medicare Other

## 2018-05-03 ENCOUNTER — Encounter (HOSPITAL_COMMUNITY): Payer: Medicare Other

## 2018-05-03 ENCOUNTER — Telehealth (HOSPITAL_COMMUNITY): Payer: Self-pay

## 2018-05-03 NOTE — Telephone Encounter (Signed)
Called patient to see if she would be interested in participating in a virtual pulmonary rehab program until we reopen. Left voice mail to return call.

## 2018-05-03 NOTE — Telephone Encounter (Signed)
Patient returned phone call. She is not interested in virtual pulmonary rehab. She does plan to return to the program when we reopen. Will follow up when we have more information about our plan to reopen.

## 2018-05-05 ENCOUNTER — Encounter (HOSPITAL_COMMUNITY): Payer: Medicare Other

## 2018-05-10 ENCOUNTER — Encounter (HOSPITAL_COMMUNITY): Payer: Medicare Other

## 2018-05-12 ENCOUNTER — Encounter (HOSPITAL_COMMUNITY): Payer: Medicare Other

## 2018-05-17 ENCOUNTER — Encounter: Payer: Self-pay | Admitting: Gastroenterology

## 2018-05-17 ENCOUNTER — Other Ambulatory Visit: Payer: Self-pay

## 2018-05-17 ENCOUNTER — Encounter (HOSPITAL_COMMUNITY): Payer: Medicare Other

## 2018-05-17 ENCOUNTER — Ambulatory Visit (INDEPENDENT_AMBULATORY_CARE_PROVIDER_SITE_OTHER): Payer: Medicare Other | Admitting: Gastroenterology

## 2018-05-17 DIAGNOSIS — K5900 Constipation, unspecified: Secondary | ICD-10-CM | POA: Insufficient documentation

## 2018-05-17 DIAGNOSIS — K219 Gastro-esophageal reflux disease without esophagitis: Secondary | ICD-10-CM | POA: Diagnosis not present

## 2018-05-17 DIAGNOSIS — K5901 Slow transit constipation: Secondary | ICD-10-CM

## 2018-05-17 MED ORDER — PANTOPRAZOLE SODIUM 40 MG PO TBEC: DELAYED_RELEASE_TABLET | ORAL | 3 refills | Status: AC

## 2018-05-17 NOTE — Patient Instructions (Signed)
TO REDUCE CONSTIPATION:    1. DRINK WATER TO KEEP YOUR URINE LIGHT YELLOW.    2. FOLLOW A HIGH FIBER DIET.  AVOID ITEMS THAT CAUSE BLOATING & GAS. SEE INFO BELOW.    3. CONTINUE PROBIOTIC DAILY.    4. USE MILK OF MAGNESIA PILLS OR LIQUID HALF TO WHOLE DOSE EVERY OTHER DAY TO REDUCE CONSTIPATION.   CONTINUE PROTONIX. TAKE 30 MINUTES PRIOR TO MEALS ONCE OR TWICE DAILY.  CONTINUE PEPCID WITH IBUPROFEN TO PREVENT GASTRITIS/ULCERS.  PLEASE CALL WITH QUESTIONS OR CONCERNS.  FOLLOW UP IN 6 MOS.    High-Fiber Diet A high-fiber diet changes your normal diet to include more whole grains, legumes, fruits, and vegetables. Changes in the diet involve replacing refined carbohydrates with unrefined foods. The calorie level of the diet is essentially unchanged. The Dietary Reference Intake (recommended amount) for adult males is 38 grams per day. For adult females, it is 25 grams per day. Pregnant and lactating women should consume 28 grams of fiber per day. Fiber is the intact part of a plant that is not broken down during digestion. Functional fiber is fiber that has been isolated from the plant to provide a beneficial effect in the body.  PURPOSE  Increase stool bulk.   Ease and regulate bowel movements.   Lower cholesterol.   REDUCE RISK OF COLON CANCER  INDICATIONS THAT YOU NEED MORE FIBER  Constipation and hemorrhoids.   Uncomplicated diverticulosis (intestine condition) and irritable bowel syndrome.   Weight management.   As a protective measure against hardening of the arteries (atherosclerosis), diabetes, and cancer.   GUIDELINES FOR INCREASING FIBER IN THE DIET  Start adding fiber to the diet slowly. A gradual increase of about 5 more grams (2 slices of whole-wheat bread, 2 servings of most fruits or vegetables, or 1 bowl of high-fiber cereal) per day is best. Too rapid an increase in fiber may result in constipation, flatulence, and bloating.   Drink enough water and fluids  to keep your urine clear or pale yellow. Water, juice, or caffeine-free drinks are recommended. Not drinking enough fluid may cause constipation.   Eat a variety of high-fiber foods rather than one type of fiber.   Try to increase your intake of fiber through using high-fiber foods rather than fiber pills or supplements that contain small amounts of fiber.   The goal is to change the types of food eaten. Do not supplement your present diet with high-fiber foods, but replace foods in your present diet.    INCLUDE A VARIETY OF FIBER SOURCES  Replace refined and processed grains with whole grains, canned fruits with fresh fruits, and incorporate other fiber sources. White rice, white breads, and most bakery goods contain little or no fiber.   Brown whole-grain rice, buckwheat oats, and many fruits and vegetables are all good sources of fiber. These include: broccoli, Brussels sprouts, cabbage, cauliflower, beets, sweet potatoes, white potatoes (skin on), carrots, tomatoes, eggplant, squash, berries, fresh fruits, and dried fruits.   Cereals appear to be the richest source of fiber. Cereal fiber is found in whole grains and bran. Bran is the fiber-rich outer coat of cereal grain, which is largely removed in refining. In whole-grain cereals, the bran remains. In breakfast cereals, the largest amount of fiber is found in those with "bran" in their names. The fiber content is sometimes indicated on the label.   You may need to include additional fruits and vegetables each day.   In baking, for 1 cup white  flour, you may use the following substitutions:   1 cup whole-wheat flour minus 2 tablespoons.   1/2 cup white flour plus 1/2 cup whole-wheat flour.

## 2018-05-17 NOTE — Assessment & Plan Note (Signed)
LIKELY DUE TO MEDS, LESS LIKELY LOW FIBER/WATER INTAKE.  TO REDUCE CONSTIPATION:   1. DRINK WATER TO KEEP YOUR URINE LIGHT YELLOW.   2. FOLLOW A HIGH FIBER DIET.  AVOID ITEMS THAT CAUSE BLOATING & GAS.  HANDOUT GIVEN.   3. CONTINUE PROBIOTIC DAILY.   4. USE MILK OF MAGNESIA PILLS OR LIQUID HALF TO WHOLE DOSE EVERY OTHER DAY TO REDUCE CONSTIPATION.  PLEASE CALL WITH QUESTIONS OR CONCERNS.  FOLLOW UP IN 6 MOS.

## 2018-05-17 NOTE — Progress Notes (Signed)
Subjective:    Patient ID: Ellen Bowers, female    DOB: 07-21-1945, 73 y.o.   MRN: [id number]   Primary Care Physician:  [name], MD  Primary GI:  Barney Drain, MD   Patient Location: home   Provider Location: [facility] office   Reason for Visit:  GERD/CONSTIPATION  Persons present on the virtual encounter, with roles: patient, myself (provider), MARTINA BOOTH CMA (update meds/allergies)   Total time (minutes) spent on medical discussion:  13 MINUTES   Due to COVID-19, visit was VIA TELEPHONE VISIT DUE TO COVID 19. VISIT IS CONDUCTED VIRTUALLY AND WAS REQUESTED BY PATIENT.   Virtual Visit via TELEPHONE   I connected with Ellen Bowers and verified that I am speaking with the correct person using two identifiers.   I discussed the limitations, risks, security and privacy concerns of performing an evaluation and management service by telephone/video and the availability of in person appointments. I also discussed with the patient that there may be a patient responsible charge related to this service. The patient expressed understanding and agreed to proceed.  HPI TROUBLE WITH CONSTIPATION. 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 OUTSIDE 2-4 HRS. DRINKS ALL DAY LONG IN HOUSE BUT NOT OUTSIDE. STARTED ON ZMX AND PROBIOTIC BY PULMONARY DOC. TAKING CALCIUM AT BEDTIME. TAKING IBUPROFEN WITH PEPCID BID. FEELS LIKE SHE'S EATING MORE SALADS. USED TO TAKE 6- PRUNES TO HAVE A BM AND BEFORE IT WAS ONLY 3. MOM(HALF DOSE) WORKS BUT DIDN'T WANT TO TAKE IT EVERY OTHER DAY BECAUSE SHE DIDN'T WANT TO B ADDICTED. RARE HEARTBURN: Q3-4 WEEKS NOW THAT SHE'S ON PEPCID AND PROTONIX. HYCODAN NOT TAKING NO ZOFRAN IN MOS. NOW AT HOME AND NOT WORKING CURRENTLY DUE TO COVID 19 AND PULMONARY DOC PUT HER OUT OF WORK. TROUBLE GETTING HER PROTONIX FROM HER PCP. DIDN'T WANT TO CALL ME TO BOTHER ME. WILL BRING PICS OF HER GARDEN WHEN WE RESUME FACE TO FACE VISITS.   PT DENIES FEVER, CHILLS,  HEMATOCHEZIA, HEMATEMESIS, nausea, vomiting, melena, diarrhea, CHEST PAIN, SHORTNESS OF BREATH,  CHANGE IN BOWEL IN HABITS, abdominal pain, problems swallowing, OR heartburn or indigestion.  Past Medical History:  Diagnosis Date  . Anxiety   . Arthritis    R knee, bilateral hips, hands & neck- being given steroid injections   . Asthma   . Blood dyscrasia   . Cancer (HCC)    L hand  . COPD (chronic obstructive pulmonary disease) ([place])   . Depression   . Dyspnea   . GERD (gastroesophageal reflux disease)   . Helicobacter pylori gastritis 2005  . Hyperlipidemia   . 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 Von Willebrand disease ([place]) 1996   Past Surgical History:  Procedure Laterality Date  . BIOPSY  12/08/2016   Procedure: BIOPSY;  Surgeon: Danie Binder, MD;  Location: [facility];  Service: Endoscopy;;  . BRAVO [place] STUDY N/A 12/08/2016   Procedure: BRAVO [place]; Bravo Capsule Placement;  Surgeon: Danie Binder, MD;  Location: [facility];  Service: Endoscopy;  Laterality: N/A;  . BREAST LUMPECTOMY Left    benign  . CATARACT EXTRACTION     double  . COLONOSCOPY N/A 05/30/2012   Dr.Mann:Multiple polyps removed from the right colon-see description above; otherwise normal colonoscopy up to the cecum. 5 polyps, multiple tubular adenomas. next tcs 05/2015  . COLONOSCOPY N/A 12/13/2015   Procedure: COLONOSCOPY;  Surgeon: Danie Binder, MD;  Location:  [facility];  Service: Endoscopy;  Laterality: N/A;  8:30 am  . ESOPHAGOGASTRODUODENOSCOPY  2012   Dr. Collene Mares: few erosions in duodenal bulb. BRAVO off PPI ("severe reflux")  . ESOPHAGOGASTRODUODENOSCOPY N/A 06/12/2014   SLF: 1. The mucosa of the esophagus appeared normal 2. Mild non-erosive gastrtitis.   [name] ESOPHAGOGASTRODUODENOSCOPY (EGD) WITH PROPOFOL N/A 12/08/2016   Procedure: ESOPHAGOGASTRODUODENOSCOPY (EGD) WITH PROPOFOL;  Surgeon: Danie Binder, MD;  [facility]: [facility];  Service: Endoscopy;  Laterality: N/A;   7:30am  . EYE SURGERY Bilateral    L& R  . FLEXIBLE SIGMOIDOSCOPY N/A 02/05/2014   Dr. Oneida Alar: 5, 2-3 mm sessile sigmoid colon polyps removed, small internal hemorrhoids, moderate external hemorrhoids, 2 anal fissures present. Polyps were hyperplastic.   [name] NECK SURGERY  1951   cyst removal  . NECK SURGERY  1998   growth removed  . THORACOTOMY Right 02/08/2017   Procedure: THORACOTOMY MAJOR;  Surgeon: Gaye Pollack, MD;  Location: [facility] OR;  Service: Thoracic;  Laterality: Right;  . TONSILLECTOMY  1951  . TUBAL LIGATION  1984  . VIDEO ASSISTED THORACOSCOPY (VATS)/WEDGE RESECTION Right 02/08/2017   Procedure: VIDEO ASSISTED THORACOSCOPY (VATS)/WEDGE RESECTION;  Surgeon: Gaye Pollack, MD;  Location: [facility] OR;  Service: Thoracic;  Laterality: Right;   Current Outpatient Medications  Medication Sig    . acetaminophen (TYLENOL) 500 MG tablet Take 1,000 mg by mouth 2 (two) times daily as needed for moderate pain or headache.    . albuterol (PROVENTIL HFA;VENTOLIN HFA) 108 (90 Base) MCG/ACT inhaler Inhale 2 puffs into the lungs every 6 (six) hours as needed for wheezing or shortness of breath.    . Ascorbic Acid (VITAMIN C) 1000 MG tablet Take 1,000 mg by mouth daily.     [name] azelastine (ASTELIN) 0.1 % nasal spray Place 2 sprays into both nostrils daily as needed for rhinitis. Use in each nostril as directed    . azithromycin (ZITHROMAX) 250 MG tablet Take 1 tablet (250 mg total) by mouth daily.    [name] b complex vitamins tablet Take 1 tablet by mouth daily.    . budesonide (PULMICORT) 0.5 MG/2ML nebulizer solution Take 0.5 mg by nebulization as needed (for shortness of breath/wheezing.).     [name] buPROPion (WELLBUTRIN SR) 200 MG 12 hr tablet Take 200 mg by mouth 2 (two) times daily.    . Calcium-Magnesium (CAL-MAG PO) Take 1 tablet by mouth at bedtime.    . carboxymethylcellulose (REFRESH PLUS) 0.5 % SOLN Place 1-2 drops into both eyes 2 (two) times daily as needed (for dry eyes). REFRESH    . cetirizine  (ZYRTEC) 10 MG tablet Take 10 mg by mouth daily.    . Cholecalciferol (VITAMIN D PO) Take by mouth daily. Unsure of dose    . diclofenac (FLECTOR) 1.3 % PTCH Place 0.25 patches onto the skin as needed. 1/4TH OF PATCH DAILY     . diclofenac sodium (VOLTAREN) 1 % GEL Apply 4 g topically 4 (four) times daily. (Patient taking differently: Apply 4 g topically as needed. )    . docusate sodium (COLACE) 100 MG capsule Take 100 mg by mouth 2 (two) times daily.     [name] doxylamine, Sleep, (UNISOM) 25 MG tablet Take 12.5 mg by mouth at bedtime.     . famotidine (PEPCID) 10 MG tablet Take 10 mg by mouth daily. As needed. Takes with Ibuprofen    . fluticasone (FLONASE) 50 MCG/ACT nasal spray Place 2 sprays into both nostrils daily as  needed for allergies or rhinitis.    . Fluticasone-Salmeterol (ADVAIR) 500-50 MCG/DOSE AEPB Inhale 1 puff into the lungs 2 (two) times daily.     [name] HYDROcodone-homatropine (HYCODAN) 5-1.5 MG/5ML syrup TAKE 1 TEASPOONFUL (5ML) BY MOUTH EVERY 6 HOURS AS NEEDED    . ibuprofen (ADVIL,MOTRIN) 800 MG tablet Take 1 tablet by mouth daily as needed (Patient taking differently: 2 (two) times daily. )    . ipratropium-albuterol (DUONEB) 0.5-2.5 (3) MG/3ML SOLN Take 3 mLs by nebulization 3 (three) times daily. (Patient taking differently: Take 3 mLs by nebulization 3 (three) times daily. Scheduled twice daily, may use additional dose as needed)    . Misc Natural Products (MIDNITE PO) Take 1 tablet by mouth at bedtime.    . montelukast (SINGULAIR) 10 MG tablet Take 10 mg by mouth daily.     .      . pantoprazole (PROTONIX) 40 MG tablet TAKE ONE TABLET BY MOUTH TWICE DAILY - 30 MINUTES BEFORE BREAKFAST & DINNER    . pravastatin (PRAVACHOL) 20 MG tablet Take 20 mg by mouth at bedtime.     . Probiotic Product (PROBIOTIC DAILY PO) Take by mouth daily.    . solifenacin (VESICARE) 5 MG tablet Take 5 mg by mouth at bedtime.     . Zoledronic Acid (RECLAST IV) Inject into the vein. Once per year    .  alum & mag hydroxide-simeth (MAALOX/MYLANTA) 200-200-20 MG/5ML suspension Take 15 mLs by mouth every 6 (six) hours as needed for indigestion or heartburn.     . Echinacea 125 MG TABS Take 1 tablet by mouth daily.     Review of Systems PER HPI OTHERWISE ALL SYSTEMS ARE NEGATIVE.    Objective:   Physical Exam  TELEPHONE VISIT DUE TO COVID 19, VISIT IS CONDUCTED VIRTUALLY AND WAS REQUESTED BY PATIENT.       Assessment & Plan:

## 2018-05-17 NOTE — Assessment & Plan Note (Signed)
SYMPTOMS NOT IDEALLY CONTROLLED ON PROTONIX 20 MG ONCE DAILY.  CONTINUE PROTONIX. TAKE 30 MINUTES PRIOR TO MEALS ONCE OR TWICE DAILY. CONTINUE PEPCID WITH IBUPROFEN TO PREVENT GASTRITIS/ULCERS. PLEASE CALL WITH QUESTIONS OR CONCERNS.  FOLLOW UP IN 6 MOS.

## 2018-05-18 ENCOUNTER — Ambulatory Visit: Payer: Medicare Other | Admitting: Gastroenterology

## 2018-05-19 ENCOUNTER — Encounter (HOSPITAL_COMMUNITY): Payer: Medicare Other

## 2018-05-24 ENCOUNTER — Encounter (HOSPITAL_COMMUNITY): Payer: Medicare Other

## 2018-05-25 ENCOUNTER — Ambulatory Visit: Payer: Medicare Other | Admitting: Gastroenterology

## 2018-05-26 ENCOUNTER — Encounter (HOSPITAL_COMMUNITY): Payer: Medicare Other

## 2018-05-31 ENCOUNTER — Encounter (HOSPITAL_COMMUNITY): Payer: Medicare Other

## 2018-06-02 ENCOUNTER — Encounter (HOSPITAL_COMMUNITY): Payer: Medicare Other

## 2018-06-07 ENCOUNTER — Encounter (HOSPITAL_COMMUNITY): Payer: Medicare Other

## 2018-06-09 ENCOUNTER — Encounter (HOSPITAL_COMMUNITY): Payer: Medicare Other

## 2018-06-10 IMAGING — CR DG CHEST 2V
2 series · 2 of 2 positions shown · non-contrast
Comparison: 02/11/2017

CLINICAL DATA: Lung cancer postop

EXAM:
CHEST  2 VIEW

[w chest pa]
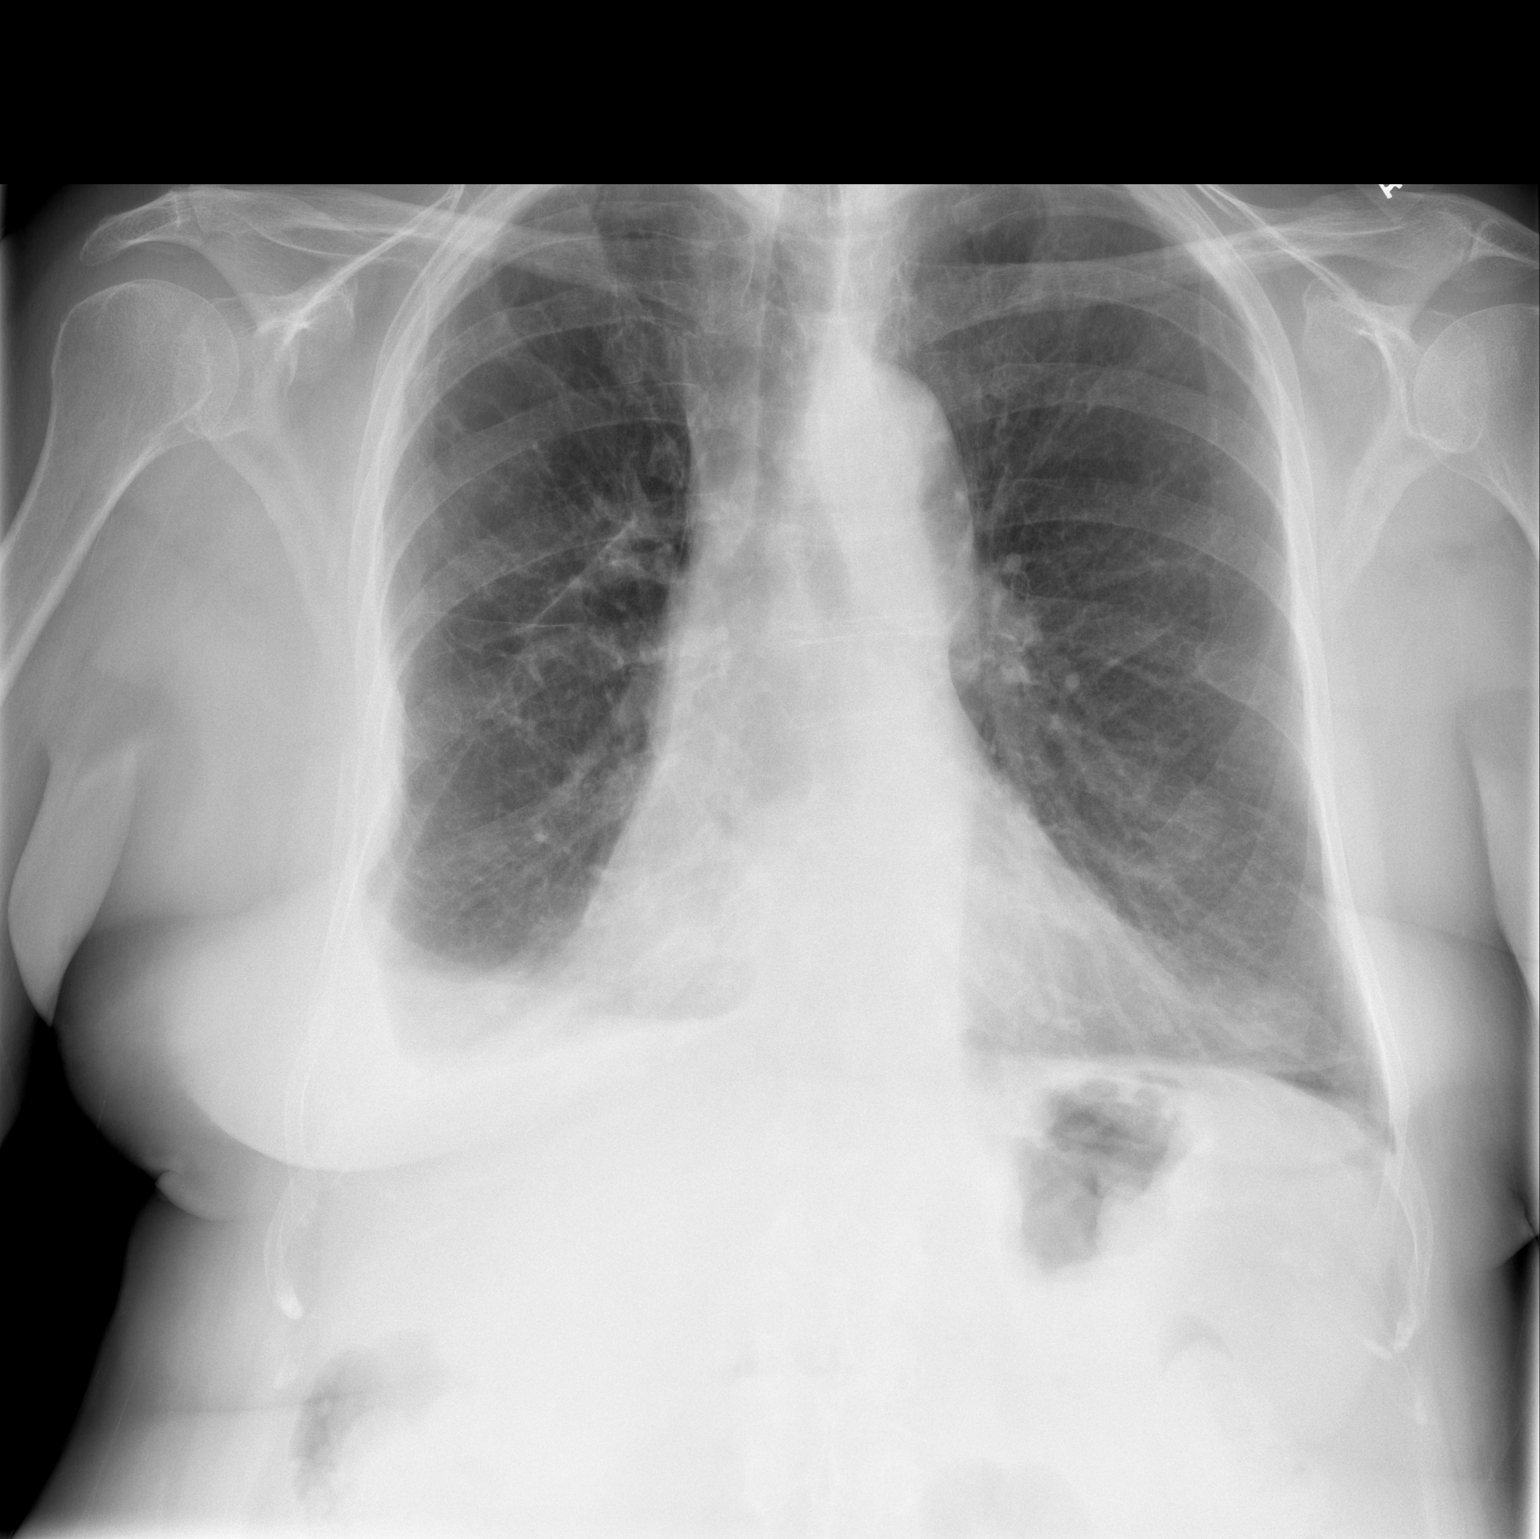

[w chest lat]
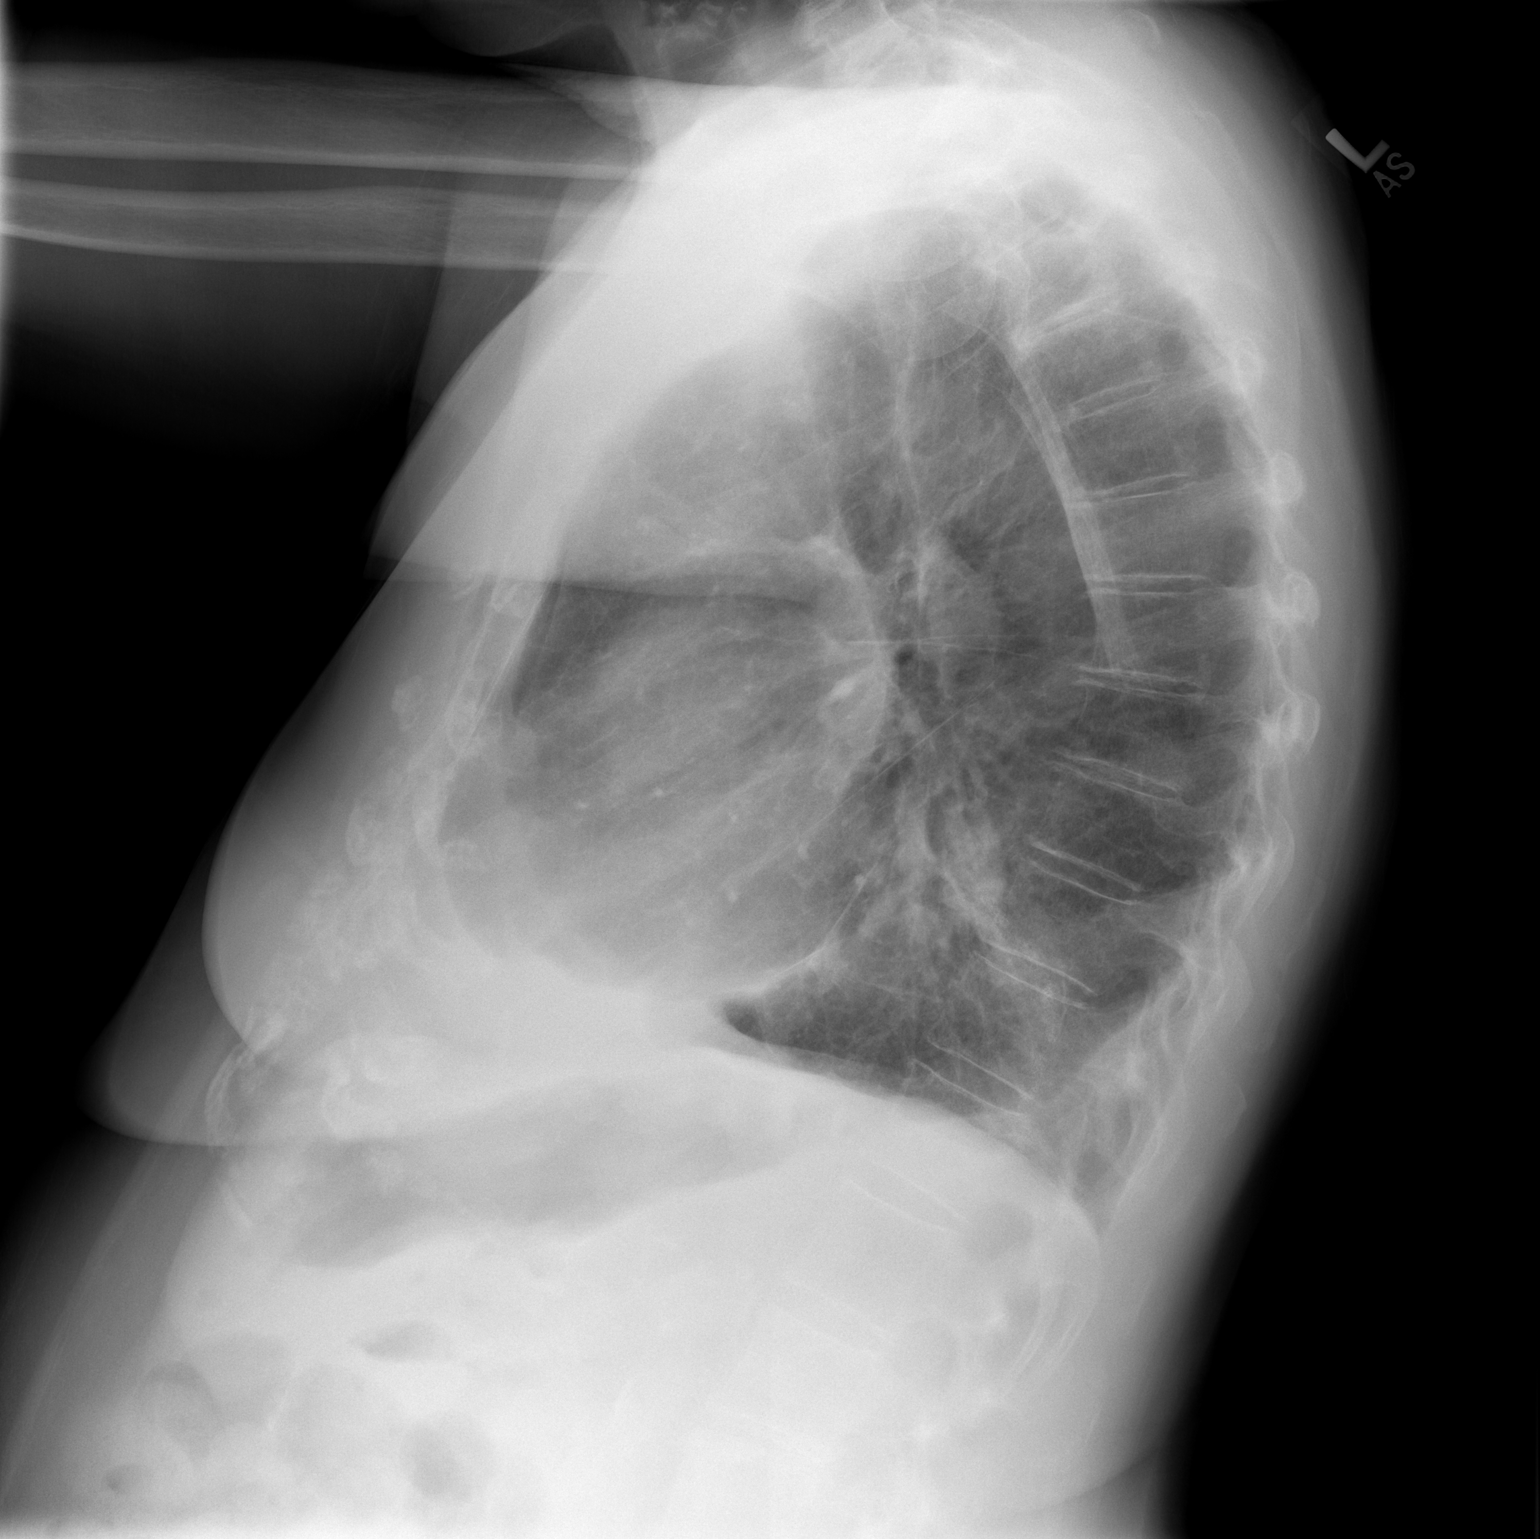

[2 of 2 positions shown; findings below may reference images not displayed]

FINDINGS: COPD with pulmonary hyperinflation.

Postop changes on the right with pleural fluid stable from the prior
study. Mild right lower lobe airspace disease stable most consistent
with atelectasis or infiltrate.

Left lung clear.  Negative for heart failure.
IMPRESSION: Postop pleural fluid on the right is stable. Mild right lower lobe
airspace disease is stable.

## 2018-06-14 ENCOUNTER — Encounter (HOSPITAL_COMMUNITY): Payer: Medicare Other

## 2018-06-16 ENCOUNTER — Encounter (HOSPITAL_COMMUNITY): Payer: Medicare Other

## 2018-06-20 ENCOUNTER — Telehealth (HOSPITAL_COMMUNITY): Payer: Self-pay

## 2018-06-20 NOTE — Telephone Encounter (Signed)
Called patient to check in and to see if she plans to return to the Pulmonary Rehab program when we reopen. She says she does plan to return to the program. She is staying active by working in her flower garden. She is looking forward to returning. Informed her we would call her when we have a reopen date. She verbalized understanding.

## 2018-06-21 ENCOUNTER — Encounter (HOSPITAL_COMMUNITY): Payer: Medicare Other

## 2018-06-23 ENCOUNTER — Encounter (HOSPITAL_COMMUNITY): Payer: Medicare Other

## 2018-06-28 ENCOUNTER — Encounter (HOSPITAL_COMMUNITY): Payer: Medicare Other

## 2018-06-30 ENCOUNTER — Encounter (HOSPITAL_COMMUNITY): Payer: Medicare Other

## 2018-07-05 ENCOUNTER — Encounter (HOSPITAL_COMMUNITY): Payer: Medicare Other

## 2018-07-06 ENCOUNTER — Telehealth (HOSPITAL_COMMUNITY): Payer: Self-pay | Admitting: *Deleted

## 2018-07-06 ENCOUNTER — Telehealth (HOSPITAL_COMMUNITY): Payer: Self-pay | Admitting: Physical Therapy

## 2018-07-06 NOTE — Telephone Encounter (Signed)
Left message for pt to please call us back by Friday to confirm her return to Pulmonary Rehab.

## 2018-07-06 NOTE — Telephone Encounter (Signed)
Spoke with patient about re-opening plans. She is ready to return and expressed an understanding of the new guidelines.

## 2018-07-07 ENCOUNTER — Encounter (HOSPITAL_COMMUNITY): Payer: Medicare Other

## 2018-07-12 ENCOUNTER — Other Ambulatory Visit: Payer: Self-pay

## 2018-07-12 ENCOUNTER — Encounter (HOSPITAL_COMMUNITY)
Admission: RE | Admit: 2018-07-12 | Discharge: 2018-07-12 | Disposition: A | Discharge status: Home / Self Care | Payer: Medicare Other | Source: Ambulatory Visit | Attending: Pulmonary Disease | Admitting: Pulmonary Disease

## 2018-07-12 DIAGNOSIS — J449 Chronic obstructive pulmonary disease, unspecified: Secondary | ICD-10-CM | POA: Diagnosis present

## 2018-07-12 NOTE — Progress Notes (Signed)
Daily Session Note  Patient Details  Name: Ellen Bowers MRN: [id number] Date of Birth: Dec 16, 1945 Referring Provider:     PULMONARY REHAB COPD ORIENTATION from 03/17/2018 in [place]  Referring Provider  Loanne Drilling      Encounter Date: 07/12/2018  Check In: Session Check In - 07/12/18 1119      Check-In   Supervising physician immediately available to respond to emergencies  See telemetry face sheet for immediately available MD    Location  [facility]    Staff Present  Russella Dar, MS, EP, [facility], Exercise Physiologist;Yusuke Beza, Exercise Physiologist;Debra Wynetta Emery, RN, BSN    Virtual Visit  No    Medication changes reported      No    Fall or balance concerns reported     No    Tobacco Cessation  No Change    Warm-up and Cool-down  Performed as group-led instruction    Resistance Training Performed  Yes    VAD Patient?  No    PAD/SET Patient?  No      Pain Assessment   Currently in Pain?  No/denies    Multiple Pain Sites  No       Capillary Blood Glucose: No results found for this or any previous visit (from the past 24 hour(s)).    Social History   Tobacco Use  Smoking Status Former Smoker  . Packs/day: 1.00  . Years: 50.00  . Pack years: 50.00  . Types: Cigarettes  . Start date: 03/17/1961  . Quit date: 05/30/2009  . Years since quitting: 9.1  Smokeless Tobacco Never Used  Tobacco Comment   Quit smoking x 7 years    Goals Met:  Proper associated with RPD/PD & O2 Sat Independence with exercise equipment Exercise tolerated well No report of cardiac concerns or symptoms Strength training completed today  Goals Unmet:  Not Applicable  Comments: Pt able to follow exercise prescription today without complaint.  Will continue to monitor for progression. Check out 1145.   Dr. Sinda Du is Medical Director for [facility].

## 2018-07-14 ENCOUNTER — Encounter (HOSPITAL_COMMUNITY)
Admission: RE | Admit: 2018-07-14 | Discharge: 2018-07-14 | Disposition: A | Discharge status: Home / Self Care | Payer: Medicare Other | Source: Ambulatory Visit | Attending: Pulmonary Disease | Admitting: Pulmonary Disease

## 2018-07-14 ENCOUNTER — Other Ambulatory Visit: Payer: Self-pay

## 2018-07-14 DIAGNOSIS — J449 Chronic obstructive pulmonary disease, unspecified: Secondary | ICD-10-CM | POA: Insufficient documentation

## 2018-07-14 NOTE — Progress Notes (Signed)
Daily Session Note  Patient Details  Name: Ellen Bowers MRN: [id number] Date of Birth: 07/26/1945 Referring Provider:     PULMONARY REHAB COPD ORIENTATION from 03/17/2018 in [place] [facility]  Referring Provider  [name]      Encounter Date: 07/14/2018  Check In: Session Check In - 07/14/18 1045      Check-In   Supervising physician immediately available to respond to emergencies  See telemetry face sheet for immediately available ER MD    Location  [facility]    Staff Present  Diane Coad, MS, EP, [facility], Exercise Physiologist;Amanda Ballard, Exercise Physiologist;Debra Johnson, RN, BSN    Virtual Visit  No    Medication changes reported      No    Fall or balance concerns reported     No    Tobacco Cessation  No Change    Warm-up and Cool-down  Performed as group-led instruction    Resistance Training Performed  Yes    VAD Patient?  No    PAD/SET Patient?  No      Pain Assessment   Currently in Pain?  No/denies    Multiple Pain Sites  No       Capillary Blood Glucose: No results found for this or any previous visit (from the past 24 hour(s)).    Social History   Tobacco Use  Smoking Status Former Smoker  . Packs/day: 1.00  . Years: 50.00  . Pack years: 50.00  . Types: Cigarettes  . Start date: 03/17/1961  . Quit date: 05/30/2009  . Years since quitting: 9.1  Smokeless Tobacco Never Used  Tobacco Comment   Quit smoking x 7 years    Goals Met:  Proper associated with RPD/PD & O2 Sat Independence with exercise equipment Exercise tolerated well No report of cardiac concerns or symptoms Strength training completed today  Goals Unmet:  Not Applicable  Comments: Pt able to follow exercise prescription today without complaint.  Will continue to monitor for progression. Check out 1145.   Dr. Edward Hawkins is Medical Director for [place] [facility]. 

## 2018-07-19 ENCOUNTER — Encounter (HOSPITAL_COMMUNITY)
Admission: RE | Admit: 2018-07-19 | Discharge: 2018-07-19 | Disposition: A | Discharge status: Home / Self Care | Payer: Medicare Other | Source: Ambulatory Visit | Attending: Pulmonary Disease | Admitting: Pulmonary Disease

## 2018-07-19 ENCOUNTER — Other Ambulatory Visit: Payer: Self-pay

## 2018-07-19 DIAGNOSIS — J449 Chronic obstructive pulmonary disease, unspecified: Secondary | ICD-10-CM

## 2018-07-19 NOTE — Progress Notes (Signed)
Daily Session Note  Patient Details  Name: Ellen Bowers MRN: [id number] Date of Birth: [birth year] Referring Provider:     PULMONARY REHAB COPD ORIENTATION from 03/17/2018 in [place]  Referring Provider  Loanne Drilling      Encounter Date: 07/19/2018  Check In: Session Check In - 07/19/18 1045      Check-In   Supervising physician immediately available to respond to emergencies  See telemetry face sheet for immediately available ER MD    Location  [facility]    Staff Present  Benay Pike, Exercise Physiologist;Debra Wynetta Emery, RN, BSN    Virtual Visit  No    Medication changes reported      No    Fall or balance concerns reported     No    Tobacco Cessation  No Change    Warm-up and Cool-down  Performed as group-led instruction    Resistance Training Performed  Yes    VAD Patient?  No    PAD/SET Patient?  No      Pain Assessment   Currently in Pain?  No/denies    Multiple Pain Sites  No       Capillary Blood Glucose: No results found for this or any previous visit (from the past 24 hour(s)).    Social History   Tobacco Use  Smoking Status Former Smoker  . Packs/day: 1.00  . Years: 50.00  . Pack years: 50.00  . Types: Cigarettes  . Start date: 03/17/1961  . Quit date: 05/30/2009  . Years since quitting: 9.1  Smokeless Tobacco Never Used  Tobacco Comment   Quit smoking x 7 years    Goals Met:  Proper associated with RPD/PD & O2 Sat Independence with exercise equipment Using PLB without cueing & demonstrates good technique Exercise tolerated well No report of cardiac concerns or symptoms Strength training completed today  Goals Unmet:  Not Applicable  Comments: Pt able to follow exercise prescription today without complaint.  Will continue to monitor for progression. Check out 1145.   Dr. Sinda Du is Medical Director for [facility].

## 2018-07-21 ENCOUNTER — Other Ambulatory Visit: Payer: Self-pay

## 2018-07-21 ENCOUNTER — Encounter (HOSPITAL_COMMUNITY)
Admission: RE | Admit: 2018-07-21 | Discharge: 2018-07-21 | Disposition: A | Discharge status: Home / Self Care | Payer: Medicare Other | Source: Ambulatory Visit | Attending: Pulmonary Disease | Admitting: Pulmonary Disease

## 2018-07-21 DIAGNOSIS — J449 Chronic obstructive pulmonary disease, unspecified: Secondary | ICD-10-CM | POA: Diagnosis not present

## 2018-07-21 NOTE — Progress Notes (Signed)
Daily Session Note  Patient Details  Name: Ellen Bowers MRN: [id number] Date of Birth: [birth year] Referring Provider:     PULMONARY REHAB COPD ORIENTATION from 03/17/2018 in [place]  Referring Provider  Loanne Drilling      Encounter Date: 07/21/2018  Check In: Session Check In - 07/21/18 1045      Check-In   Supervising physician immediately available to respond to emergencies  See telemetry face sheet for immediately available MD    Location  [facility]    Staff Present  Benay Pike, Exercise Physiologist;Tyrea Froberg Wynetta Emery, RN, BSN;Diane Coad, MS, EP, [facility], Exercise Physiologist    Virtual Visit  No    Medication changes reported      No    Fall or balance concerns reported     No    Tobacco Cessation  No Change    Warm-up and Cool-down  Performed as group-led instruction    Resistance Training Performed  No    VAD Patient?  No    PAD/SET Patient?  No      Pain Assessment   Currently in Pain?  No/denies    Multiple Pain Sites  No       Capillary Blood Glucose: No results found for this or any previous visit (from the past 24 hour(s)).    Social History   Tobacco Use  Smoking Status Former Smoker  . Packs/day: 1.00  . Years: 50.00  . Pack years: 50.00  . Types: Cigarettes  . Start date: 03/17/1961  . Quit date: 05/30/2009  . Years since quitting: 9.1  Smokeless Tobacco Never Used  Tobacco Comment   Quit smoking x 7 years    Goals Met:  Proper associated with RPD/PD & O2 Sat Independence with exercise equipment Improved SOB with ADL's Using PLB without cueing & demonstrates good technique Exercise tolerated well No report of cardiac concerns or symptoms Strength training completed today  Goals Unmet:  Not Applicable  Comments: Pt able to follow exercise prescription today without complaint.  Will continue to monitor for progression. Check out 1145.   Dr. Sinda Du is Medical Director for [facility] Pulmonary  Rehab.

## 2018-07-26 ENCOUNTER — Encounter (HOSPITAL_COMMUNITY)
Admission: RE | Admit: 2018-07-26 | Discharge: 2018-07-26 | Disposition: A | Discharge status: Home / Self Care | Payer: Medicare Other | Source: Ambulatory Visit | Attending: Pulmonary Disease | Admitting: Pulmonary Disease

## 2018-07-26 ENCOUNTER — Other Ambulatory Visit: Payer: Self-pay

## 2018-07-26 DIAGNOSIS — J449 Chronic obstructive pulmonary disease, unspecified: Secondary | ICD-10-CM

## 2018-07-26 NOTE — Progress Notes (Signed)
Daily Session Note  Patient Details  Name: SHADONNA BENEDICK MRN: [id number] Date of Birth: [birth year] Referring Provider:     PULMONARY REHAB COPD ORIENTATION from 03/17/2018 in [place]  Referring Provider  Loanne Drilling      Encounter Date: 07/26/2018  Check In: Session Check In - 07/26/18 1045      Check-In   Supervising physician immediately available to respond to emergencies  See telemetry face sheet for immediately available ER MD    Location  [facility]    Staff Present  Benay Pike, Exercise Physiologist;Diane Coad, MS, EP, [facility], Exercise Physiologist    Virtual Visit  No    Medication changes reported      No    Fall or balance concerns reported     No    Tobacco Cessation  No Change    Warm-up and Cool-down  Performed as group-led instruction    Resistance Training Performed  Yes    VAD Patient?  No    PAD/SET Patient?  No      Pain Assessment   Currently in Pain?  No/denies    Multiple Pain Sites  No       Capillary Blood Glucose: No results found for this or any previous visit (from the past 24 hour(s)).    Social History   Tobacco Use  Smoking Status Former Smoker  . Packs/day: 1.00  . Years: 50.00  . Pack years: 50.00  . Types: Cigarettes  . Start date: 03/17/1961  . Quit date: 05/30/2009  . Years since quitting: 9.1  Smokeless Tobacco Never Used  Tobacco Comment   Quit smoking x 7 years    Goals Met:  Proper associated with RPD/PD & O2 Sat Independence with exercise equipment Improved SOB with ADL's Exercise tolerated well No report of cardiac concerns or symptoms Strength training completed today  Goals Unmet:  Not Applicable  Comments: Pt able to follow exercise prescription today without complaint.  Will continue to monitor for progression. Check out 1145.   Dr. Sinda Du is Medical Director for [facility] Pulmonary Rehab.

## 2018-07-27 ENCOUNTER — Other Ambulatory Visit: Payer: Self-pay

## 2018-07-27 ENCOUNTER — Ambulatory Visit
Admission: RE | Admit: 2018-07-27 | Discharge: 2018-07-27 | Disposition: A | Discharge status: Home / Self Care | Payer: Medicare Other | Source: Ambulatory Visit | Attending: Surgery | Admitting: Surgery

## 2018-07-27 ENCOUNTER — Encounter: Payer: Self-pay | Admitting: Surgery

## 2018-07-27 ENCOUNTER — Telehealth: Payer: Self-pay | Admitting: Pulmonary Disease

## 2018-07-27 ENCOUNTER — Ambulatory Visit: Payer: Medicare Other | Admitting: Surgery

## 2018-07-27 VITALS — BP 147/68 | HR 84 | Temp 97.7°F | Resp 16 | Ht 69.0 in | Wt 203.0 lb

## 2018-07-27 DIAGNOSIS — Z902 Acquired absence of lung [part of]: Secondary | ICD-10-CM

## 2018-07-27 DIAGNOSIS — C3431 Malignant neoplasm of lower lobe, right bronchus or lung: Secondary | ICD-10-CM

## 2018-07-27 DIAGNOSIS — R911 Solitary pulmonary nodule: Secondary | ICD-10-CM

## 2018-07-27 NOTE — Telephone Encounter (Signed)
Handicap placard filled out and signed by Ellen Bowers. Called and spoke with pt letting her know this was done and that placard was being placed in mail. Pt verbalized understanding. Nothing further needed.

## 2018-07-27 NOTE — Telephone Encounter (Signed)
Yes

## 2018-07-27 NOTE — Telephone Encounter (Signed)
Call returned to patient, she is requesting a new handicap placard application. I made her aware we would send a message and get back with her regarding this. I made here aware because it was so late in the afternoon she may not get a call back until first thing in the morning. Voiced understanding. She is requesting that it be mailed to her. Confirmed address on file.    Beth please advise if willing to sign handicap placard application. Thanks.

## 2018-07-27 NOTE — Progress Notes (Signed)
HPI:  Mrs. Metheney returns today for follow-up of some new changes on her CT scan of the chest. 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  This lesion has persisted but has not changed.  I did a virtual telephone visit with her in April after her CT scan of the chest which showed a nodular focus of consolidation along the wedge resection staple line in the posterior right lower lobe that measured about 2.2 x 1.0 cm and was immediately adjacent to the chest wall.  Radiology thought this had increased slightly in size compared to the prior chest CT but looked about the same to me.  There was a sub-solid 1.0 cm posterior right upper lobe nodule that had not changed from the prior CT of 09/22/2017 and had minimally increased in size since the CT scan of 06/13/2014.  This lesion did have low level hypermetabolic activity on the PET scan in 2018 so I did not think we could completely rule out a very slow-growing adenocarcinoma although this lesion appeared stable from her CT scan last September.  There was also a new 5 mm solid nodule in the left upper lobe.  This was too small to characterize further other than to say that it is new.   Since I last saw her she has been doing well.  She is attending pulmonary rehab now.  She feels that since she has been on Zithromax once daily her bronchitis has improved.  Current Outpatient Medications  Medication Sig Dispense Refill  . acetaminophen (TYLENOL) 500 MG tablet Take 1,000 mg by mouth 2 (two) times daily as  needed for moderate pain or headache.    . albuterol (PROVENTIL HFA;VENTOLIN HFA) 108 (90 Base) MCG/ACT inhaler Inhale 2 puffs into the lungs every 6 (six) hours as needed for wheezing or shortness of breath.    [name] alum & mag hydroxide-simeth (MAALOX/MYLANTA) 200-200-20 MG/5ML suspension Take 15 mLs by mouth every 6 (six) hours as needed for indigestion or heartburn.     . Ascorbic Acid (VITAMIN C) 1000 MG tablet Take 1,000 mg by mouth daily.     [name] azelastine (ASTELIN) 0.1 % nasal spray Place 2 sprays into both nostrils daily as needed for rhinitis. Use in each nostril as directed    . azithromycin (ZITHROMAX) 250 MG tablet Take 1 tablet (250 mg total) by mouth daily. 90 tablet 1  . b complex vitamins tablet Take 1 tablet by mouth daily.    . budesonide (PULMICORT) 0.5 MG/2ML nebulizer solution Take 0.5 mg by nebulization as needed (for shortness of breath/wheezing.).     [name] buPROPion (WELLBUTRIN SR) 200 MG 12 hr tablet Take 200 mg by mouth 2 (two) times daily.    . Calcium-Magnesium (CAL-MAG PO) Take 1 tablet by mouth at bedtime.    . carboxymethylcellulose (REFRESH PLUS) 0.5 % SOLN Place 1-2 drops into both eyes 2 (two) times daily as needed (for dry eyes). REFRESH    . celecoxib (CELEBREX) 200 MG capsule Take 200 mg by mouth 2 (two) times daily.    [name]  cetirizine (ZYRTEC) 10 MG tablet Take 10 mg by mouth daily.    . Cholecalciferol (VITAMIN D PO) Take by mouth daily. Unsure of dose    . diclofenac (FLECTOR) 1.3 % PTCH Place 0.25 patches onto the skin as needed. 1/4TH OF PATCH DAILY     . diclofenac sodium (VOLTAREN) 1 % GEL Apply 4 g topically 4 (four) times daily. (Patient taking differently: Apply 4 g topically as needed. ) 3 Tube 4  . docusate sodium (COLACE) 100 MG capsule Take 100 mg by mouth 2 (two) times daily.     [name] doxylamine, Sleep, (UNISOM) 25 MG tablet Take 12.5 mg by mouth at bedtime.     . fluticasone (FLONASE) 50 MCG/ACT nasal spray Place 2 sprays into both nostrils daily as needed  for allergies or rhinitis.    . Fluticasone-Salmeterol (ADVAIR) 500-50 MCG/DOSE AEPB Inhale 1 puff into the lungs 2 (two) times daily.     [name] HYDROcodone-homatropine (HYCODAN) 5-1.5 MG/5ML syrup TAKE 1 TEASPOONFUL (5ML) BY MOUTH EVERY 6 HOURS AS NEEDED 473 mL 0  . ipratropium-albuterol (DUONEB) 0.5-2.5 (3) MG/3ML SOLN Take 3 mLs by nebulization 3 (three) times daily. (Patient taking differently: Take 3 mLs by nebulization 3 (three) times daily. Scheduled twice daily, may use additional dose as needed) 360 mL 11  . Misc Natural Products (MIDNITE PO) Take 1 tablet by mouth at bedtime.    . montelukast (SINGULAIR) 10 MG tablet Take 10 mg by mouth daily.     . ondansetron (ZOFRAN) 4 MG tablet Take 1 tablet (4 mg total) by mouth every 8 (eight) hours as needed for nausea or vomiting. 20 tablet 0  . pantoprazole (PROTONIX) 40 MG tablet TAKE ONE TABLET BY MOUTH TWICE DAILY - 30 MINUTES BEFORE BREAKFAST & DINNER 180 tablet 3  . pravastatin (PRAVACHOL) 20 MG tablet Take 20 mg by mouth at bedtime.     . Probiotic Product (PROBIOTIC DAILY PO) Take by mouth daily.    . solifenacin (VESICARE) 5 MG tablet Take 5 mg by mouth at bedtime.     . Zoledronic Acid (RECLAST IV) Inject into the vein. Once per year     No current facility-administered medications for this visit.      Physical Exam: BP (!) 147/68 (BP Location: Right Arm, Patient Position: Sitting, Cuff Size: Normal)   Pulse 84   Temp 97.7 F (36.5 C) Comment: THERMAL  Resp 16   Ht 5\' 9"  (1.753 m)   Wt 203 lb (92.1 kg)   SpO2 97% Comment: RA.Marland KitchenMarland KitchenIN PU;MARY REHAB  BMI 29.98 kg/m  She looks well. There is no cervical or supraclavicular adenopathy. Lungs are clear. Cardiac exam shows a regular rate and rhythm with normal heart sounds.  Diagnostic Tests:  CLINICAL DATA:  Non-small cell lung cancer, pulmonary nodules, pretreatment evaluation, follow-up left upper lobe lung nodule, history of right-sided VATS  EXAM: CT CHEST WITHOUT CONTRAST   TECHNIQUE: Multidetector CT imaging of the chest was performed following the standard protocol without IV contrast.  COMPARISON:  04/13/2018, 09/22/2017  FINDINGS: Cardiovascular: Mild aortic atherosclerosis. Three-vessel coronary artery calcifications. Normal heart size. No pericardial effusion.  Mediastinum/Nodes: Unchanged prominent mediastinal lymph nodes. Thyroid gland, trachea, and esophagus demonstrate no significant findings.  Lungs/Pleura: Moderate centrilobular emphysema. There are new adjacent spiculated opacities of the left pulmonary apex measuring 2.0 x 0.9 cm (series 8, image 27) and 1.0 cm (series 8, image 16). There is an additional smaller sub solid opacity in the right pulmonary apex measuring 5 mm (  series 8, image 26). Unchanged 5 mm nodule of the left apex (series 8, image 25). Unchanged irregular opacity of the posterior right pulmonary apex (series 8, image 29). No significant change in postoperative findings of right lower lobe wedge resection with adjacent, dependent probable rounded atelectasis and/or scarring measuring approximately 9 mm in thickness (series 8, image 102). Trace, chronic right pleural effusion and/or pleural thickening.  Upper Abdomen: No acute abnormality.  Musculoskeletal: No chest wall mass or suspicious bone lesions identified.  IMPRESSION: 1. No significant change in postoperative findings of right lower lobe wedge resection with adjacent, dependent probable rounded atelectasis and/or scarring measuring approximately 9 mm in thickness (series 8, image 102). Trace, chronic right pleural effusion and/or pleural thickening. Unchanged prominent mediastinal lymph nodes.  2. There are new adjacent spiculated opacities of the left pulmonary apex measuring 2.0 x 0.9 cm (series 8, image 27) and 1.0 cm (series 8, image 16). There is an additional smaller sub solid opacity in the right pulmonary apex measuring 5 mm (series  8, image 26). Although these are suspicious for malignancy, rapid development suggests infection or inflammation. Consider short-term CT follow-up, PET-CT to characterize for metabolic activity, or tissue diagnosis. Percutaneous biopsy is likely technically difficult due to apical location and emphysema.  3. Unchanged 5 mm nodule of the left apex (series 8, image 25). Unchanged irregular opacity of the posterior right pulmonary apex (series 8, image 29). Continued attention on follow-up.  3.  Aortic atherosclerosis and coronary artery disease.   Electronically Signed   By: Eddie Candle M.D.   On: 07/27/2018 10:08   Impression:  I have personally reviewed her CT scan of the chest from 07/27/2018 and compared it to her prior CT scan of 04/13/2018.  I reviewed the images with her and answered all of her questions.  Her recent CT scan of the chest shows 2 new adjacent spiculated opacities in the left lung apex measuring 2 x 0.9 cm and 1.0 cm.  There is an additional new 5 mm sub-solid opacity in the right lung apex.  The previously noted 5 mm nodule in the left lung apex is unchanged.  The irregular opacity of the right lung apex seen peripherally is unchanged.  The previously seen density at the wedge resection site are unchanged and most likely rounded atelectasis or scarring.  The new lesions in the left lung apex are suspicious for malignancy but their rapid development since the CT scan in April would suggest that they may be inflammation or infection.  These are deep in the lobe and I think a CT-guided needle biopsy would be a higher risk procedure due to her severe COPD.  I think the best option is to do a follow-up CT scan in about 3 months to reevaluate these lesions.  She is in agreement with that plan.  Plan:  She has a follow-up appointment next week with her pulmonary physician.  I will plan to see her back in 3 months with a CT scan of the chest.  I spent 15 minutes performing  this established patient evaluation and > 50% of this time was spent face to face counseling and coordinating the surveillance of her prior lung cancer and new pulmonary nodules.    Gaye Pollack, MD [facility] [phone]

## 2018-07-28 ENCOUNTER — Encounter (HOSPITAL_COMMUNITY)
Admission: RE | Admit: 2018-07-28 | Discharge: 2018-07-28 | Disposition: A | Discharge status: Home / Self Care | Payer: Medicare Other | Source: Ambulatory Visit | Attending: Pulmonary Disease | Admitting: Pulmonary Disease

## 2018-07-28 DIAGNOSIS — J449 Chronic obstructive pulmonary disease, unspecified: Secondary | ICD-10-CM | POA: Diagnosis not present

## 2018-07-28 NOTE — Progress Notes (Signed)
Daily Session Note  Patient Details  Name: Ellen Bowers MRN: [id number] Date of Birth: 07/23/1945 Referring Provider:     PULMONARY REHAB COPD ORIENTATION from 03/17/2018 in [place] [facility]ARDIAC REHABILITATION  Referring Provider  [name]      Encounter Date: 07/28/2018  Check In: Session Check In - 07/28/18 1045      Check-In   Supervising physician immediately available to respond to emergencies  See telemetry face sheet for immediately available ER MD    Location  [facility]    Staff Present  Amanda Ballard, Exercise Physiologist;Debra Johnson, RN, BSN    Virtual Visit  No    Medication changes reported      No    Fall or balance concerns reported     No    Tobacco Cessation  No Change    Warm-up and Cool-down  Performed as group-led instruction    Resistance Training Performed  Yes    VAD Patient?  No    PAD/SET Patient?  No      Pain Assessment   Currently in Pain?  No/denies    Multiple Pain Sites  No       Capillary Blood Glucose: No results found for this or any previous visit (from the past 24 hour(s)).    Social History   Tobacco Use  Smoking Status Former Smoker  . Packs/day: 1.00  . Years: 50.00  . Pack years: 50.00  . Types: Cigarettes  . Start date: 03/17/1961  . Quit date: 05/30/2009  . Years since quitting: 9.1  Smokeless Tobacco Never Used  Tobacco Comment   Quit smoking x 7 years    Goals Met:  Proper associated with RPD/PD & O2 Sat Independence with exercise equipment Using PLB without cueing & demonstrates good technique Exercise tolerated well No report of cardiac concerns or symptoms Strength training completed today  Goals Unmet:  Not Applicable  Comments: Pt able to follow exercise prescription today without complaint.  Will continue to monitor for progression. Check out 1145.   Dr. Edward Hawkins is Medical Director for [place] [facility]. 

## 2018-08-02 ENCOUNTER — Encounter (HOSPITAL_COMMUNITY)
Admission: RE | Admit: 2018-08-02 | Discharge: 2018-08-02 | Disposition: A | Discharge status: Home / Self Care | Payer: Medicare Other | Source: Ambulatory Visit | Attending: Pulmonary Disease | Admitting: Pulmonary Disease

## 2018-08-02 ENCOUNTER — Other Ambulatory Visit: Payer: Self-pay

## 2018-08-02 DIAGNOSIS — J449 Chronic obstructive pulmonary disease, unspecified: Secondary | ICD-10-CM

## 2018-08-02 NOTE — Progress Notes (Signed)
Pulmonary Individual Treatment Plan  Patient Details  Name: Ellen Bowers MRN: [id number] Date of Birth: 08/09/1945 Referring Provider:     PULMONARY REHAB COPD ORIENTATION from 03/17/2018 in [facility]  Referring Provider  Loanne Drilling      Initial Encounter Date:    PULMONARY REHAB COPD ORIENTATION from 03/17/2018 in [facility]  Date  03/17/18      Visit Diagnosis: Chronic obstructive pulmonary disease, unspecified COPD type ([place])   Patient's Home Medications on Admission:   Current Outpatient Medications:  .  acetaminophen (TYLENOL) 500 MG tablet, Take 1,000 mg by mouth 2 (two) times daily as needed for moderate pain or headache., Disp: , Rfl:  .  albuterol (PROVENTIL HFA;VENTOLIN HFA) 108 (90 Base) MCG/ACT inhaler, Inhale 2 puffs into the lungs every 6 (six) hours as needed for wheezing or shortness of breath., Disp: , Rfl:  .  alum & mag hydroxide-simeth (MAALOX/MYLANTA) 200-200-20 MG/5ML suspension, Take 15 mLs by mouth every 6 (six) hours as needed for indigestion or heartburn. , Disp: , Rfl:  .  Ascorbic Acid (VITAMIN C) 1000 MG tablet, Take 1,000 mg by mouth daily. , Disp: , Rfl:  .  azelastine (ASTELIN) 0.1 % nasal spray, Place 2 sprays into both nostrils daily as needed for rhinitis. Use in each nostril as directed, Disp: , Rfl:  .  azithromycin (ZITHROMAX) 250 MG tablet, Take 1 tablet (250 mg total) by mouth daily., Disp: 90 tablet, Rfl: 1 .  b complex vitamins tablet, Take 1 tablet by mouth daily., Disp: , Rfl:  .  budesonide (PULMICORT) 0.5 MG/2ML nebulizer solution, Take 0.5 mg by nebulization as needed (for shortness of breath/wheezing.). , Disp: , Rfl:  .  buPROPion (WELLBUTRIN SR) 200 MG 12 hr tablet, Take 200 mg by mouth 2 (two) times daily., Disp: , Rfl:  .  Calcium-Magnesium (CAL-MAG PO), Take 1 tablet by mouth at bedtime., Disp: , Rfl:  .  carboxymethylcellulose (REFRESH PLUS) 0.5 % SOLN, Place 1-2 drops into both eyes 2  (two) times daily as needed (for dry eyes). REFRESH, Disp: , Rfl:  .  celecoxib (CELEBREX) 200 MG capsule, Take 200 mg by mouth 2 (two) times daily., Disp: , Rfl:  .  cetirizine (ZYRTEC) 10 MG tablet, Take 10 mg by mouth daily., Disp: , Rfl:  .  Cholecalciferol (VITAMIN D PO), Take by mouth daily. Unsure of dose, Disp: , Rfl:  .  diclofenac (FLECTOR) 1.3 % PTCH, Place 0.25 patches onto the skin as needed. 1/4TH OF PATCH DAILY , Disp: , Rfl:  .  diclofenac sodium (VOLTAREN) 1 % GEL, Apply 4 g topically 4 (four) times daily. (Patient taking differently: Apply 4 g topically as needed. ), Disp: 3 Tube, Rfl: 4 .  docusate sodium (COLACE) 100 MG capsule, Take 100 mg by mouth 2 (two) times daily. , Disp: , Rfl:  .  doxylamine, Sleep, (UNISOM) 25 MG tablet, Take 12.5 mg by mouth at bedtime. , Disp: , Rfl:  .  fluticasone (FLONASE) 50 MCG/ACT nasal spray, Place 2 sprays into both nostrils daily as needed for allergies or rhinitis., Disp: , Rfl:  .  Fluticasone-Salmeterol (ADVAIR) 500-50 MCG/DOSE AEPB, Inhale 1 puff into the lungs 2 (two) times daily. , Disp: , Rfl:  .  HYDROcodone-homatropine (HYCODAN) 5-1.5 MG/5ML syrup, TAKE 1 TEASPOONFUL (5ML) BY MOUTH EVERY 6 HOURS AS NEEDED, Disp: 473 mL, Rfl: 0 .  ipratropium-albuterol (DUONEB) 0.5-2.5 (3) MG/3ML SOLN, Take 3 mLs by nebulization 3 (three) times daily. (  Patient taking differently: Take 3 mLs by nebulization 3 (three) times daily. Scheduled twice daily, may use additional dose as needed), Disp: 360 mL, Rfl: 11 .  Misc Natural Products (MIDNITE PO), Take 1 tablet by mouth at bedtime., Disp: , Rfl:  .  montelukast (SINGULAIR) 10 MG tablet, Take 10 mg by mouth daily. , Disp: , Rfl:  .  ondansetron (ZOFRAN) 4 MG tablet, Take 1 tablet (4 mg total) by mouth every 8 (eight) hours as needed for nausea or vomiting., Disp: 20 tablet, Rfl: 0 .  pantoprazole (PROTONIX) 40 MG tablet, TAKE ONE TABLET BY MOUTH TWICE DAILY - 30 MINUTES BEFORE BREAKFAST & DINNER, Disp: 180  tablet, Rfl: 3 .  pravastatin (PRAVACHOL) 20 MG tablet, Take 20 mg by mouth at bedtime. , Disp: , Rfl:  .  Probiotic Product (PROBIOTIC DAILY PO), Take by mouth daily., Disp: , Rfl:  .  solifenacin (VESICARE) 5 MG tablet, Take 5 mg by mouth at bedtime. , Disp: , Rfl:  .  Zoledronic Acid (RECLAST IV), Inject into the vein. Once per year, Disp: , Rfl:   Past Medical History: Past Medical History:  Diagnosis Date  . Anxiety   . Arthritis    R knee, bilateral hips, hands & neck- being given steroid injections   . Asthma   . Blood dyscrasia   . Cancer (HCC)    L hand  . COPD (chronic obstructive pulmonary disease) ([place])   . Depression   . Dyspnea   . GERD (gastroesophageal reflux disease)   . Helicobacter pylori gastritis 2005  . Hyperlipidemia   . 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 Von Willebrand disease ([place]) 1996    Tobacco Use: Social History   Tobacco Use  Smoking Status Former Smoker  . Packs/day: 1.00  . Years: 50.00  . Pack years: 50.00  . Types: Cigarettes  . Start date: 03/17/1961  . Quit date: 05/30/2009  . Years since quitting: 9.1  Smokeless Tobacco Never Used  Tobacco Comment   Quit smoking x 7 years    Labs: Recent Review Flowsheet Data    Labs for ITP Cardiac and Pulmonary Rehab Latest Ref Rng & Units 02/04/2017 02/08/2017 02/09/2017   PHART 7.350 - 7.450 7.461(H) 7.338(L) 7.423   PCO2ART 32.0 - 48.0 mmHg 33.1 48.6(H) 37.6   HCO3 20.0 - 28.0 mmol/L 23.3 26.3 24.1   TCO2 22 - 32 mmol/L - 28 -   ACIDBASEDEF 0.0 - 2.0 mmol/L 0.1 - -   O2SAT % 96.9 95.0 98.4      Capillary Blood Glucose: Lab Results  Component Value Date   GLUCAP 119 (H) 02/10/2017   GLUCAP 147 (H) 02/09/2017   GLUCAP 136 (H) 02/09/2017   GLUCAP 141 (H) 02/09/2017   GLUCAP 131 (H) 02/09/2017     Pulmonary Assessment Scores: Pulmonary Assessment Scores    Row Name 03/17/18 1132         ADL UCSD   ADL Phase  Entry     SOB Score total  50     Rest  0     Walk  5      Stairs  3     Bath  3     Dress  2     Shop  1       CAT Score   CAT Score  22       mMRC Score   mMRC Score  3  UCSD: Self-administered rating of dyspnea associated with activities of daily living (ADLs) 6-point scale (0 = "not at all" to 5 = "maximal or unable to do because of breathlessness")  Scoring Scores range from 0 to 120.  Minimally important difference is 5 units  CAT: CAT can identify the health impairment of COPD patients and is better correlated with disease progression.  CAT has a scoring range of zero to 40. The CAT score is classified into four groups of low (less than 10), medium (10 - 20), high (21-30) and very high (31-40) based on the impact level of disease on health status. A CAT score over 10 suggests significant symptoms.  A worsening CAT score could be explained by an exacerbation, poor medication adherence, poor inhaler technique, or progression of COPD or comorbid conditions.  CAT MCID is 2 points  mMRC: mMRC (Modified Medical Research Council) Dyspnea Scale is used to assess the degree of baseline functional disability in patients of respiratory disease due to dyspnea. No minimal important difference is established. A decrease in score of 1 point or greater is considered a positive change.   Pulmonary Function Assessment: Pulmonary Function Assessment - 03/17/18 1125      Pulmonary Function Tests   FVC%  70 %    FEV1%  60 %    FEV1/FVC Ratio  86    RV%  79 %    DLCO%  44 %      Initial Spirometry Results   FVC%  70 %    FEV1%  60 %    FEV1/FVC Ratio  86      Post Bronchodilator Spirometry Results   FVC%  68 %    FEV1%  61 %    FEV1/FVC Ratio  68      Breath   Bilateral Breath Sounds  Wheezes    Shortness of Breath  Yes;Limiting activity       Exercise Target Goals: Exercise Program Goal: Individual exercise prescription set using results from initial 6 min walk test and THRR while considering  patient's activity barriers and  safety.   Exercise Prescription Goal: Initial exercise prescription builds to 30-45 minutes a day of aerobic activity, 2-3 days per week.  Home exercise guidelines will be given to patient during program as part of exercise prescription that the participant will acknowledge.  Activity Barriers & Risk Stratification: Activity Barriers & Cardiac Risk Stratification - 03/17/18 1009      Activity Barriers & Cardiac Risk Stratification   Activity Barriers  Muscular Weakness;Shortness of Breath;Deconditioning;Other (comment)    Comments  R knee pain, bilateral hip pain     Cardiac Risk Stratification  High       6 Minute Walk: 6 Minute Walk    Row Name 03/17/18 1007         6 Minute Walk   Phase  Initial     Distance  1200 feet     Walk Time  6 minutes     # of Rest Breaks  0     MPH  2.27     METS  2.74     RPE  13     Perceived Dyspnea   14     VO2 Peak  9.64     Symptoms  Yes (comment)     Comments  2/10 R knee pain, 3/10 bilateral hip pain, lightheadedness      Resting HR  81 bpm     Resting BP  128/70  Resting Oxygen Saturation   99 %     Exercise Oxygen Saturation  during 6 min walk  91 %     Max Ex. HR  106 bpm     Max Ex. BP  160/80     2 Minute Post BP  136/76        Oxygen Initial Assessment: Oxygen Initial Assessment - 03/17/18 1124      Home Oxygen   Home Oxygen Device  None    Sleep Oxygen Prescription  None    Home Exercise Oxygen Prescription  None    Home at Rest Exercise Oxygen Prescription  None    Compliance with Home Oxygen Use  --   N/A     Initial 6 min Walk   Oxygen Used  None      Program Oxygen Prescription   Program Oxygen Prescription  None       Oxygen Re-Evaluation: Oxygen Re-Evaluation    Row Name 04/04/18 1450 08/02/18 1343           Program Oxygen Prescription   Program Oxygen Prescription  None  None        Home Oxygen   Home Oxygen Device  None  None      Sleep Oxygen Prescription  None  None      Home Exercise  Oxygen Prescription  None  None      Home at Rest Exercise Oxygen Prescription  -  None      Compliance with Home Oxygen Use  - N/A  -        Goals/Expected Outcomes   Short Term Goals  To learn and understand importance of monitoring SPO2 with pulse oximeter and demonstrate accurate use of the pulse oximeter.;To learn and understand importance of maintaining oxygen saturations>88%;To learn and demonstrate proper pursed lip breathing techniques or other breathing techniques.  To learn and understand importance of monitoring SPO2 with pulse oximeter and demonstrate accurate use of the pulse oximeter.;To learn and understand importance of maintaining oxygen saturations>88%;To learn and demonstrate proper pursed lip breathing techniques or other breathing techniques.      Long  Term Goals  Maintenance of O2 saturations>88%;Exhibits proper breathing techniques, such as pursed lip breathing or other method taught during program session;Verbalizes importance of monitoring SPO2 with pulse oximeter and return demonstration  Maintenance of O2 saturations>88%;Exhibits proper breathing techniques, such as pursed lip breathing or other method taught during program session;Verbalizes importance of monitoring SPO2 with pulse oximeter and return demonstration      Comments  Patient is able to verbalize the importance of maintaining her O2Sat >88%. She is also able to demonstrate proper usage of pulse oximetry and proper pursed lip breathing techniques during the sessions.   Patient is able to verbalize the importance of maintaining her O2Sat >88%. She is also able to demonstrate proper usage of pulse oximetry and proper pursed lip breathing techniques during the sessions.       Goals/Expected Outcomes  Patient will continue to meet both her short and long term goals.   Patient will continue to meet both her short and long term goals.          Oxygen Discharge (Final Oxygen Re-Evaluation): Oxygen Re-Evaluation -  08/02/18 1343      Program Oxygen Prescription   Program Oxygen Prescription  None      Home Oxygen   Home Oxygen Device  None    Sleep Oxygen Prescription  None    Home Exercise Oxygen  Prescription  None    Home at Rest Exercise Oxygen Prescription  None      Goals/Expected Outcomes   Short Term Goals  To learn and understand importance of monitoring SPO2 with pulse oximeter and demonstrate accurate use of the pulse oximeter.;To learn and understand importance of maintaining oxygen saturations>88%;To learn and demonstrate proper pursed lip breathing techniques or other breathing techniques.    Long  Term Goals  Maintenance of O2 saturations>88%;Exhibits proper breathing techniques, such as pursed lip breathing or other method taught during program session;Verbalizes importance of monitoring SPO2 with pulse oximeter and return demonstration    Comments  Patient is able to verbalize the importance of maintaining her O2Sat >88%. She is also able to demonstrate proper usage of pulse oximetry and proper pursed lip breathing techniques during the sessions.     Goals/Expected Outcomes  Patient will continue to meet both her short and long term goals.        Initial Exercise Prescription: Initial Exercise Prescription - 03/17/18 1000      Date of Initial Exercise RX and Referring Provider   Date  03/17/18    Referring Provider  Loanne Drilling    Expected Discharge Date  06/17/18      Treadmill   MPH  1    Grade  0    Minutes  17    METs  1.88      NuStep   Level  1    SPM  40    Minutes  17    METs  1.8      Prescription Details   Frequency (times per week)  2    Duration  Progress to 30 minutes of continuous aerobic without signs/symptoms of physical distress      Intensity   THRR 40-80% of Max Heartrate  107-121-134    Ratings of Perceived Exertion  11-13    Perceived Dyspnea  0-4      Progression   Progression  Continue to progress workloads to maintain intensity without  signs/symptoms of physical distress.      Resistance Training   Training Prescription  Yes    Weight  1    Reps  10-15       Perform Capillary Blood Glucose checks as needed.  Exercise Prescription Changes:  Exercise Prescription Changes    Row Name 03/17/18 1000 04/04/18 1100 07/28/18 1300         Response to Exercise   Blood Pressure (Admit)  128/70  130/58  138/78     Blood Pressure (Exercise)  160/80  160/80  152/66     Blood Pressure (Exit)  136/76  144/60  142/68     Heart Rate (Admit)  81 bpm  77 bpm  85 bpm     Heart Rate (Exercise)  106 bpm  91 bpm  105 bpm     Heart Rate (Exit)  78 bpm  71 bpm  94 bpm     Oxygen Saturation (Admit)  99 %  97 %  95 %     Oxygen Saturation (Exercise)  91 %  97 %  94 %     Oxygen Saturation (Exit)  98 %  99 %  95 %     Rating of Perceived Exertion (Exercise)  13  11  13      Perceived Dyspnea (Exercise)  14  12  14      Symptoms  R knee pain, bilateral hip pain   -  -  Comments  6 minute walk test  first week of exercise   -     Duration  Progress to 30 minutes of  aerobic without signs/symptoms of physical distress  Continue with 30 min of aerobic exercise without signs/symptoms of physical distress.  Continue with 30 min of aerobic exercise without signs/symptoms of physical distress.     Intensity  THRR New 107-121-139  THRR unchanged  THRR unchanged       Progression   Progression  -  Continue to progress workloads to maintain intensity without signs/symptoms of physical distress.  Continue to progress workloads to maintain intensity without signs/symptoms of physical distress.     Average METs  -  1.8  2       Resistance Training   Training Prescription  -  Yes  Yes     Weight  -  1  2     Reps  -  10-15  10-15       Treadmill   MPH  -  1.1  1.4     Grade  -  0  0     Minutes  -  17  17     METs  -  1.84  2.07       NuStep   Level  -  - bothered R knee.   1     SPM  -  -  87     Minutes  -  -  17     METs  -  -  2        Arm Ergometer   Level  -  1  -     [place]atts  -  10  -     Minutes  -  22  -     METs  -  1.8  -       Home Exercise Plan   Plans to continue exercise at  Home (comment)  Home (comment)  Home (comment)     Frequency  Add 3 additional days to program exercise sessions.  Add 3 additional days to program exercise sessions.  Add 3 additional days to program exercise sessions.     Initial Home Exercises Provided  03/17/18  03/17/18  03/17/18        Exercise Comments:  Exercise Comments    Row Name 04/04/18 1122 07/28/18 1308 08/02/18 1253       Exercise Comments  Pt. is new to [facility]. She has attended 3 sessions so far. Her knee bothered her on the NuStep so we switched her to the Arm Ergometer.   Pt. has returned since bein gout due to [place]. She has tolerated the exercise well.  Ellen Bowers has attended 10 sessions so far. She is able to walk on the Treadmill with ease and has even requested to switch and do it second so that she can walk longer. We will continue to incresae her workloads as needed.        Exercise Goals and Review:  Exercise Goals    Row Name 03/17/18 1014             Exercise Goals   Increase Physical Activity  Yes       Intervention  Provide advice, education, support and counseling about physical activity/exercise needs.;Develop an individualized exercise prescription for aerobic and resistive training based on initial evaluation findings, risk stratification, comorbidities and participant's personal goals.       Expected Outcomes  Short Term: Attend rehab  on a regular basis to increase amount of physical activity.;Long Term: Add in home exercise to make exercise part of routine and to increase amount of physical activity.;Long Term: Exercising regularly at least 3-5 days a week.       Increase Strength and Stamina  Yes       Intervention  Provide advice, education, support and counseling about physical activity/exercise needs.;Develop an individualized exercise  prescription for aerobic and resistive training based on initial evaluation findings, risk stratification, comorbidities and participant's personal goals.       Expected Outcomes  Short Term: Increase workloads from initial exercise prescription for resistance, speed, and METs.       Able to understand and use rate of perceived exertion (RPE) scale  Yes       Intervention  Provide education and explanation on how to use RPE scale       Expected Outcomes  Short Term: Able to use RPE daily in rehab to express subjective intensity level;Long Term:  Able to use RPE to guide intensity level when exercising independently       Able to understand and use Dyspnea scale  Yes       Intervention  Provide education and explanation on how to use Dyspnea scale       Expected Outcomes  Short Term: Able to use Dyspnea scale daily in rehab to express subjective sense of shortness of breath during exertion;Long Term: Able to use Dyspnea scale to guide intensity level when exercising independently       Knowledge and understanding of Target Heart Rate Range (THRR)  Yes       Intervention  Provide education and explanation of THRR including how the numbers were predicted and where they are located for reference       Expected Outcomes  Short Term: Able to state/look up THRR       Able to check pulse independently  Yes       Intervention  Provide education and demonstration on how to check pulse in carotid and radial arteries.;Review the importance of being able to check your own pulse for safety during independent exercise       Expected Outcomes  Short Term: Able to explain why pulse checking is important during independent exercise;Long Term: Able to check pulse independently and accurately       Understanding of Exercise Prescription  Yes       Intervention  Provide education, explanation, and written materials on patient's individual exercise prescription       Expected Outcomes  Short Term: Able to explain program  exercise prescription;Long Term: Able to explain home exercise prescription to exercise independently          Exercise Goals Re-Evaluation : Exercise Goals Re-Evaluation    Row Name 04/04/18 1121 08/02/18 1251           Exercise Goal Re-Evaluation   Exercise Goals Review  Increase Physical Activity;Able to understand and use rate of perceived exertion (RPE) scale;Knowledge and understanding of Target Heart Rate Range (THRR);Understanding of Exercise Prescription;Increase Strength and Stamina;Able to understand and use Dyspnea scale;Able to check pulse independently  Increase Physical Activity;Increase Strength and Stamina;Able to understand and use rate of perceived exertion (RPE) scale;Able to understand and use Dyspnea scale;Knowledge and understanding of Target Heart Rate Range (THRR);Able to check pulse independently;Understanding of Exercise Prescription      Comments  Pt. is new to the program. She has attended 3 sessions so far. We switched her  off of the NuStep due to knee pain, but she is tolerating the TM and Arm Ergometer well.   Pt. is doing well in Pulmonary Rehab. She is somewhat weak but feels this helps her to improve her stength and walking abilities.      Expected Outcomes  ambulate better and decrease SOB.   Short: increase overall activity level. Long: increased endurance.         Discharge Exercise Prescription (Final Exercise Prescription Changes): Exercise Prescription Changes - 07/28/18 1300      Response to Exercise   Blood Pressure (Admit)  138/78    Blood Pressure (Exercise)  152/66    Blood Pressure (Exit)  142/68    Heart Rate (Admit)  85 bpm    Heart Rate (Exercise)  105 bpm    Heart Rate (Exit)  94 bpm    Oxygen Saturation (Admit)  95 %    Oxygen Saturation (Exercise)  94 %    Oxygen Saturation (Exit)  95 %    Rating of Perceived Exertion (Exercise)  13    Perceived Dyspnea (Exercise)  14    Duration  Continue with 30 min of aerobic exercise without  signs/symptoms of physical distress.    Intensity  THRR unchanged      Progression   Progression  Continue to progress workloads to maintain intensity without signs/symptoms of physical distress.    Average METs  2      Resistance Training   Training Prescription  Yes    Weight  2    Reps  10-15      Treadmill   MPH  1.4    Grade  0    Minutes  17    METs  2.07      NuStep   Level  1    SPM  87    Minutes  17    METs  2      Home Exercise Plan   Plans to continue exercise at  Home (comment)    Frequency  Add 3 additional days to program exercise sessions.    Initial Home Exercises Provided  03/17/18       Nutrition:  Target Goals: Understanding of nutrition guidelines, daily intake of sodium 1500mg , cholesterol 200mg , calories 30% from fat and 7% or less from saturated fats, daily to have 5 or more servings of fruits and vegetables.  Biometrics: Pre Biometrics - 03/17/18 1015      Pre Biometrics   Height  5\' 9"  (1.753 m)    Waist Circumference  42 inches    Hip Circumference  46 inches    Waist to Hip Ratio  0.91 %    Triceps Skinfold  24 mm    % Body Fat  41.6 %    Grip Strength  10 kg    Flexibility  0 in    Single Leg Stand  5.84 seconds        Nutrition Therapy Plan and Nutrition Goals: Nutrition Therapy & Goals - 08/02/18 1343      Nutrition Therapy   RD appointment deferred  Yes      Personal Nutrition Goals   Comments  Patient says she has cut back on the amount of sugar she puts in her coffee and has given up all deserts. She feels she is meeting her nutritional gaols. Will continue to monitor for progress.      Intervention Plan   Intervention  Nutrition handout(s) given to patient.  Nutrition Assessments: Nutrition Assessments - 03/17/18 1139      MEDFICTS Scores   Pre Score  77       Nutrition Goals Re-Evaluation:   Nutrition Goals Discharge (Final Nutrition Goals Re-Evaluation):   Psychosocial: Target Goals:  Acknowledge presence or absence of significant depression and/or stress, maximize coping skills, provide positive support system. Participant is able to verbalize types and ability to use techniques and skills needed for reducing stress and depression.  Initial Review & Psychosocial Screening: Initial Psych Review & Screening - 03/17/18 1135      Initial Review   Current issues with  History of Depression      Family Dynamics   Good Support System?  Yes      Barriers   Psychosocial barriers to participate in program  The patient should benefit from training in stress management and relaxation.      Screening Interventions   Interventions  Encouraged to exercise    Expected Outcomes  Short Term goal: Identification and review with participant of any Quality of Life or Depression concerns found by scoring the questionnaire.;Long Term goal: The participant improves quality of Life and PHQ9 Scores as seen by post scores and/or verbalization of changes       Quality of Life Scores: Quality of Life - 03/17/18 1018      Quality of Life   Select  Quality of Life      Quality of Life Scores   Health/Function Pre  15.59 %    Socioeconomic Pre  21.29 %    Psych/Spiritual Pre  13.86 %    Family Pre  13.1 %    GLOBAL Pre  16.03 %      Scores of 19 and below usually indicate a poorer quality of life in these areas.  A difference of  2-3 points is a clinically meaningful difference.  A difference of 2-3 points in the total score of the Quality of Life Index has been associated with significant improvement in overall quality of life, self-image, physical symptoms, and general health in studies assessing change in quality of life.   PHQ-9: Recent Review Flowsheet Data    Depression screen [facility] 2/9 03/17/2018   Decreased Interest 1   Down, Depressed, Hopeless 0   PHQ - 2 Score 1   Altered sleeping 3   Tired, decreased energy 1   Change in appetite 2   Feeling bad or failure about yourself  0    Trouble concentrating 0   Moving slowly or fidgety/restless 0   Suicidal thoughts 0   PHQ-9 Score 7   Difficult doing work/chores Somewhat difficult     Interpretation of Total Score  Total Score Depression Severity:  1-4 = Minimal depression, 5-9 = Mild depression, 10-14 = Moderate depression, 15-19 = Moderately severe depression, 20-27 = Severe depression   Psychosocial Evaluation and Intervention: Psychosocial Evaluation - 03/17/18 1137      Psychosocial Evaluation & Interventions   Interventions  Encouraged to exercise with the program and follow exercise prescription    Continue Psychosocial Services   Follow up required by staff       Psychosocial Re-Evaluation: Psychosocial Re-Evaluation    [place] Name 04/04/18 1456 08/02/18 1347           Psychosocial Re-Evaluation   Current issues with  History of Depression  History of Depression      Comments  Patient's initial QOL score was 16.03 and her PHQ-9 score was 7. She is currently  on Wellbutrin 200 mg BID for depression. Will continue to monitor.   Patient's initial QOL score was 16.03 and her PHQ-9 score was 7. She is currently on Wellbutrin 200 mg BID for depression. Will continue to monitor.       Expected Outcomes  Patient's depression will continue to be managed and she will have no additional psychosocial issues identified at discharge.   Patient's depression will continue to be managed and she will have no additional psychosocial issues identified at discharge.       Interventions  Relaxation education;Encouraged to attend Pulmonary Rehabilitation for the exercise;Stress management education  Relaxation education;Encouraged to attend Pulmonary Rehabilitation for the exercise;Stress management education      Continue Psychosocial Services   Follow up required by staff  Follow up required by staff         Psychosocial Discharge (Final Psychosocial Re-Evaluation): Psychosocial Re-Evaluation - 08/02/18 1347       Psychosocial Re-Evaluation   Current issues with  History of Depression    Comments  Patient's initial QOL score was 16.03 and her PHQ-9 score was 7. She is currently on Wellbutrin 200 mg BID for depression. Will continue to monitor.     Expected Outcomes  Patient's depression will continue to be managed and she will have no additional psychosocial issues identified at discharge.     Interventions  Relaxation education;Encouraged to attend Pulmonary Rehabilitation for the exercise;Stress management education    Continue Psychosocial Services   Follow up required by staff        Education: Education Goals: Education classes will be provided on a weekly basis, covering required topics. Participant will state understanding/return demonstration of topics presented.  Learning Barriers/Preferences: Learning Barriers/Preferences - 03/17/18 1139      Learning Barriers/Preferences   Learning Barriers  None       Education Topics: How Lungs Work and Diseases: - Discuss the anatomy of the lungs and diseases that can affect the lungs, such as COPD.   PULMONARY REHAB CHRONIC OBSTRUCTIVE PULMONARY DISEASE from 07/28/2018 in [place]  Date  07/14/18  Educator  DJ  Instruction Review Code  2- Demonstrated Understanding      Exercise: -Discuss the importance of exercise, FITT principles of exercise, normal and abnormal responses to exercise, and how to exercise safely.   Environmental Irritants: -Discuss types of environmental irritants and how to limit exposure to environmental irritants.   PULMONARY REHAB CHRONIC OBSTRUCTIVE PULMONARY DISEASE from 07/28/2018 in [place]  Date  07/21/18  Educator  D. Coad  Instruction Review Code  2- Demonstrated Understanding      Meds/Inhalers and oxygen: - Discuss respiratory medications, definition of an inhaler and oxygen, and the proper way to use an inhaler and oxygen.   PULMONARY REHAB CHRONIC  OBSTRUCTIVE PULMONARY DISEASE from 07/28/2018 in [place]  Date  07/28/18  Educator  DC      Energy Saving Techniques: - Discuss methods to conserve energy and decrease shortness of breath when performing activities of daily living.    Bronchial Hygiene / Breathing Techniques: - Discuss breathing mechanics, pursed-lip breathing technique,  proper posture, effective ways to clear airways, and other functional breathing techniques   Cleaning Equipment: - Provides group verbal and written instruction about the health risks of elevated stress, cause of high stress, and healthy ways to reduce stress.   PULMONARY REHAB CHRONIC OBSTRUCTIVE PULMONARY DISEASE from 07/28/2018 in [place]  Date  03/24/18  Educator  [place]  Instruction Review Code  2- Demonstrated Understanding      Nutrition I: Fats: - Discuss the types of cholesterol, what cholesterol does to the body, and how cholesterol levels can be controlled.   Nutrition II: Labels: -Discuss the different components of food labels and how to read food labels.   Respiratory Infections: - Discuss the signs and symptoms of respiratory infections, ways to prevent respiratory infections, and the importance of seeking medical treatment when having a respiratory infection.   Stress I: Signs and Symptoms: - Discuss the causes of stress, how stress may lead to anxiety and depression, and ways to limit stress.   Stress II: Relaxation: -Discuss relaxation techniques to limit stress.   Oxygen for Home/Travel: - Discuss how to prepare for travel when on oxygen and proper ways to transport and store oxygen to ensure safety.   Knowledge Questionnaire Score: Knowledge Questionnaire Score - 03/17/18 1013      Knowledge Questionnaire Score   Pre Score  16/18       Core Components/Risk Factors/Patient Goals at Admission: Personal Goals and Risk Factors at Admission - 03/17/18 1141       Core Components/Risk Factors/Patient Goals on Admission    Weight Management  Yes    Intervention  Weight Management: Develop a combined nutrition and exercise program designed to reach desired caloric intake, while maintaining appropriate intake of nutrient and fiber, sodium and fats, and appropriate energy expenditure required for the weight goal.    Admit Weight  206 lb 8 oz (93.7 kg)    Goal Weight: Short Term  196 lb 8 oz (89.1 kg)    Goal Weight: Long Term  186 lb 8 oz (84.6 kg)    Personal Goal Other  Yes    Personal Goal  Lose 20lbs, ambulate better w/SOB, increase endurance     Intervention  Attend PR 2 x week, supplement with 3 x week at home exercise.     Expected Outcomes  Reach personal goal.        Core Components/Risk Factors/Patient Goals Review:  Goals and Risk Factor Review    Row Name 04/04/18 1453 08/02/18 1343           Core Components/Risk Factors/Patient Goals Review   Personal Goals Review  Weight Management/Obesity;Improve shortness of breath with ADL's Lose 20 lbs; ambulate better w/o SOB; increase endurance.   Weight Management/Obesity;Improve shortness of breath with ADL's Lose 20 lbs; ambulate better w/o SOB; increase endruance.      Review  Patient has completed 3 sessions maintaining her weight since she started the program. She is doing well in the program. She says she has only had 2 sessions and has not noticed much progress yet. Outpatient pulmonary services was suspended 03/28/18 due to COVID-19 restrictions. Patient says she plans to walk to her mailbox everyday which is on an incline to remain active. Will continue to monitor.   Pulmonary Rehab closed 03/28/18 and reopened 07/11/18. Patient returned to the program losing 4 lbs during the closure. She is doing well in the program with progression. She says does feel stronger. She says her rigth knee bothers her at times decreasing her activity at home. She says she had a recent CT scan showing 2 new spots on  her lungs. She says they are going to monitor this and do a repeat CT scan in 3 months. She is concerned that her lung cancer had returned. Will continue to monitor for progress.  Expected Outcomes  Patient will continue to attend sessions and complete the program meeting her personal goald.   Patient will continue to attend sessions and complete the program meeting her personal goald.          Core Components/Risk Factors/Patient Goals at Discharge (Final Review):  Goals and Risk Factor Review - 08/02/18 1343      Core Components/Risk Factors/Patient Goals Review   Personal Goals Review  Weight Management/Obesity;Improve shortness of breath with ADL's   Lose 20 lbs; ambulate better w/o SOB; increase endruance.   Review  Pulmonary Rehab closed 03/28/18 and reopened 07/11/18. Patient returned to the program losing 4 lbs during the closure. She is doing well in the program with progression. She says does feel stronger. She says her rigth knee bothers her at times decreasing her activity at home. She says she had a recent CT scan showing 2 new spots on her lungs. She says they are going to monitor this and do a repeat CT scan in 3 months. She is concerned that her lung cancer had returned. Will continue to monitor for progress.    Expected Outcomes  Patient will continue to attend sessions and complete the program meeting her personal goald.        ITP Comments: ITP Comments    Row Name 04/04/18 1449           ITP Comments  Pulmonary rehab services was suspended 03/28/18 due to COVID-19 restrictions and will resume when restrictions are lifted.           Comments: ITP REVIEW Pt is making expected progress toward pulmonary rehab goals after completing 10 sessions. Recommend continued exercise, life style modification, education, and utilization of breathing techniques to increase stamina and strength and decrease shortness of breath with exertion.

## 2018-08-02 NOTE — Progress Notes (Signed)
Daily Session Note  Patient Details  Name: Ellen Bowers MRN: [id number] Date of Birth: 1945-12-22 Referring Provider:     PULMONARY REHAB COPD ORIENTATION from 03/17/2018 in [place]  Referring Provider  Loanne Drilling      Encounter Date: 08/02/2018  Check In: Session Check In - 08/02/18 1045      Check-In   Supervising physician immediately available to respond to emergencies  See telemetry face sheet for immediately available ER MD    Location  [facility]    Staff Present  Russella Dar, MS, EP, [facility], Exercise Physiologist;Crista Nuon, Exercise Physiologist;Debra Wynetta Emery, RN, BSN    Virtual Visit  No    Medication changes reported      No    Fall or balance concerns reported     No    Tobacco Cessation  No Change    Warm-up and Cool-down  Performed as group-led instruction    Resistance Training Performed  Yes    VAD Patient?  No    PAD/SET Patient?  No      Pain Assessment   Currently in Pain?  No/denies    Multiple Pain Sites  No       Capillary Blood Glucose: No results found for this or any previous visit (from the past 24 hour(s)).    Social History   Tobacco Use  Smoking Status Former Smoker  . Packs/day: 1.00  . Years: 50.00  . Pack years: 50.00  . Types: Cigarettes  . Start date: 03/17/1961  . Quit date: 05/30/2009  . Years since quitting: 9.1  Smokeless Tobacco Never Used  Tobacco Comment   Quit smoking x 7 years    Goals Met:  Proper associated with RPD/PD & O2 Sat Independence with exercise equipment Using PLB without cueing & demonstrates good technique Exercise tolerated well No report of cardiac concerns or symptoms Strength training completed today  Goals Unmet:  Not Applicable  Comments: Pt able to follow exercise prescription today without complaint.  Will continue to monitor for progression. Check out 1145.   Dr. Sinda Du is Medical Director for [facility].

## 2018-08-03 ENCOUNTER — Ambulatory Visit (INDEPENDENT_AMBULATORY_CARE_PROVIDER_SITE_OTHER): Payer: Medicare Other | Admitting: Pulmonary Disease

## 2018-08-03 ENCOUNTER — Other Ambulatory Visit: Payer: Self-pay

## 2018-08-03 ENCOUNTER — Encounter: Payer: Self-pay | Admitting: Pulmonary Disease

## 2018-08-03 VITALS — BP 118/74 | HR 71 | Temp 97.9°F | Ht 69.0 in | Wt 203.4 lb

## 2018-08-03 DIAGNOSIS — J449 Chronic obstructive pulmonary disease, unspecified: Secondary | ICD-10-CM | POA: Diagnosis not present

## 2018-08-03 DIAGNOSIS — J439 Emphysema, unspecified: Secondary | ICD-10-CM

## 2018-08-03 DIAGNOSIS — R918 Other nonspecific abnormal finding of lung field: Secondary | ICD-10-CM

## 2018-08-03 LAB — CBC WITH DIFFERENTIAL/PLATELET
Basophils Absolute: 0.1 10*3/uL (ref 0.0–0.1)
Basophils Relative: 0.8 % (ref 0.0–3.0)
Eosinophils Absolute: 0.3 10*3/uL (ref 0.0–0.7)
Eosinophils Relative: 3.2 % (ref 0.0–5.0)
HCT: 38.7 % (ref 36.0–46.0)
Hemoglobin: 12.5 g/dL (ref 12.0–15.0)
Lymphocytes Relative: 47.9 % — ABNORMAL HIGH (ref 12.0–46.0)
Lymphs Abs: 4 10*3/uL (ref 0.7–4.0)
MCHC: 32.3 g/dL (ref 30.0–36.0)
MCV: 85.4 fl (ref 78.0–100.0)
Monocytes Absolute: 0.7 10*3/uL (ref 0.1–1.0)
Monocytes Relative: 8.1 % (ref 3.0–12.0)
Neutro Abs: 3.4 10*3/uL (ref 1.4–7.7)
Neutrophils Relative %: 40 % — ABNORMAL LOW (ref 43.0–77.0)
Platelets: 279 10*3/uL (ref 150.0–400.0)
RBC: 4.53 Mil/uL (ref 3.87–5.11)
RDW: 14.8 % (ref 11.5–15.5)
WBC: 8.4 10*3/uL (ref 4.0–10.5)

## 2018-08-03 LAB — COMPREHENSIVE METABOLIC PANEL
ALT: 20 U/L (ref 0–35)
AST: 19 U/L (ref 0–37)
Albumin: 4.4 g/dL (ref 3.5–5.2)
Alkaline Phosphatase: 76 U/L (ref 39–117)
BUN: 15 mg/dL (ref 6–23)
CO2: 28 mEq/L (ref 19–32)
Calcium: 10 mg/dL (ref 8.4–10.5)
Chloride: 100 mEq/L (ref 96–112)
Creatinine, Ser: 0.87 mg/dL (ref 0.40–1.20)
GFR: 63.76 mL/min (ref >60.00)
Glucose, Bld: 89 mg/dL (ref 70–99)
Potassium: 4.1 mEq/L (ref 3.5–5.1)
Sodium: 135 mEq/L (ref 135–145)
Total Bilirubin: 0.5 mg/dL (ref 0.2–1.2)
Total Protein: 7.1 g/dL (ref 6.0–8.3)

## 2018-08-03 MED ORDER — AZITHROMYCIN 250 MG PO TABS: ORAL_TABLET | ORAL | 1 refills | Status: DC

## 2018-08-03 NOTE — Progress Notes (Signed)
Synopsis: Referred in 01/2018 for   Subjective:   PATIENT ID: Ellen Bowers GENDER: female DOB: 02/17/1945, MRN: [id number]   HPI  Chief Complaint  Patient presents with   Follow-up   Ms. 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   She is a former patient of Dr. Lenna Gilford.  Office notes have previously been reviewed and summarized as follows: In the last few months she has had pneumonia and chronic bronchitis treated with antibiotics x 2. On her last visit with him, she was treated with azithromycin, depo 120, prednisone taper and cough syrup. Her current regimen at that time was scheduled Pulmicort and Duonebs and Advair 500 BID.   Interval history On our last visit she was started on chronic macrolide therapy and referred to Pulmonary rehab. Since then she has been compliant with her Duonebs and high-dose Advair. Does not use her Pulmicort except when wheezing which may be once a week.  She started Pulmonary rehab earlier this month and is tolerating it well. Since starting azithro, her cough has dried up and is 90% improved. Denies chest pressure/pain. Uses albuterol once a day.  Her activity level has improved and overall feels her breathing is better controlled.  No longer working at [employer] since pandemic so [place].  She is followed by Dr. Cyndia Bent for history of right lower lobe squamous cell carcinoma status post wedge resection.  She has had serial CTs monitoring irregular opacity in the right upper lobe measured at 9 mm on PET.  On repeat imaging in 04/2018, interval development of pulmonary nodule in the left upper lobe was noted.  Repeat CT in 07/2018, demonstrated stable left upper lobe nodule however additional interval development of left upper lobe and right upper lobe  opacities.  Social History: Stop smoking 8 years ago.  50 pack years. Father had TB when 12 years ago Previously worked as a Doctor, general practice exposures: None   Personally reviewed patient's past medical history and current medications.  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     albuterol (PROVENTIL HFA;VENTOLIN HFA) 108 (90 Base) MCG/ACT inhaler Inhale 2 puffs into the lungs every 6 (six) hours as needed for wheezing or shortness of breath.     alum & mag hydroxide-simeth (MAALOX/MYLANTA) 200-200-20 MG/5ML suspension Take 15 mLs by mouth every 6 (six) hours as needed for indigestion or heartburn.      Ascorbic Acid (VITAMIN C) 1000 MG tablet Take 1,000 mg by mouth daily.      azelastine (ASTELIN) 0.1 % nasal spray Place 2  sprays into both nostrils daily as needed for rhinitis. Use in each nostril as directed     b complex vitamins tablet Take 1 tablet by mouth daily.     budesonide (PULMICORT) 0.5 MG/2ML nebulizer solution Take 0.5 mg by nebulization as needed (for shortness of breath/wheezing.).      buPROPion (WELLBUTRIN SR) 200 MG 12 hr tablet Take 200 mg by mouth 2 (two) times daily.     Calcium-Magnesium (CAL-MAG PO) Take 1 tablet by mouth at bedtime.     carboxymethylcellulose (REFRESH PLUS) 0.5 % SOLN Place 1-2 drops into both eyes 2 (two)  times daily as needed (for dry eyes). REFRESH     celecoxib (CELEBREX) 200 MG capsule Take 200 mg by mouth 2 (two) times daily.     cetirizine (ZYRTEC) 10 MG tablet Take 10 mg by mouth daily.     Cholecalciferol (VITAMIN D PO) Take by mouth daily. Unsure of dose     diclofenac (FLECTOR) 1.3 % PTCH Place 0.25 patches onto the skin as needed. 1/4TH OF PATCH DAILY      diclofenac sodium (VOLTAREN) 1 % GEL Apply 4 g topically 4 (four) times daily. (Patient taking differently: Apply 4 g topically as needed. ) 3 Tube 4   docusate sodium (COLACE) 100 MG capsule Take 100 mg by mouth 2 (two) times daily.      doxylamine, Sleep, (UNISOM) 25 MG tablet Take 12.5 mg by mouth at bedtime.      fluticasone (FLONASE) 50 MCG/ACT nasal spray Place 2 sprays into both nostrils daily as needed for allergies or rhinitis.     Fluticasone-Salmeterol (ADVAIR) 500-50 MCG/DOSE AEPB Inhale 1 puff into the lungs 2 (two) times daily.      HYDROcodone-homatropine (HYCODAN) 5-1.5 MG/5ML syrup TAKE 1 TEASPOONFUL (5ML) BY MOUTH EVERY 6 HOURS AS NEEDED 473 mL 0   ipratropium-albuterol (DUONEB) 0.5-2.5 (3) MG/3ML SOLN Take 3 mLs by nebulization 3 (three) times daily. (Patient taking differently: Take 3 mLs by nebulization 3 (three) times daily. Scheduled twice daily, may use additional dose as needed) 360 mL 11   Misc Natural Products (MIDNITE PO) Take 1 tablet by mouth at bedtime.     montelukast (SINGULAIR) 10 MG tablet Take 10 mg by mouth daily.      ondansetron (ZOFRAN) 4 MG tablet Take 1 tablet (4 mg total) by mouth every 8 (eight) hours as needed for nausea or vomiting. 20 tablet 0   pantoprazole (PROTONIX) 40 MG tablet TAKE ONE TABLET BY MOUTH TWICE DAILY - 30 MINUTES BEFORE BREAKFAST & DINNER 180 tablet 3   pravastatin (PRAVACHOL) 20 MG tablet Take 20 mg by mouth at bedtime.      Probiotic Product (PROBIOTIC DAILY PO) Take by mouth daily.     solifenacin (VESICARE) 5 MG tablet Take 5 mg by mouth at bedtime.       azithromycin (ZITHROMAX) 250 MG tablet Take 1 tablet (250 mg total) by mouth daily. 90 tablet 1   Zoledronic Acid (RECLAST IV) Inject into the vein. Once per year     No facility-administered medications prior to visit.     Review of Systems  Constitutional: Negative for chills, diaphoresis, fever, malaise/fatigue and weight loss.  HENT: Negative for congestion, ear pain and sore throat.   Respiratory: Positive for cough and shortness of breath. Negative for hemoptysis, sputum production and wheezing.   Cardiovascular: Negative for chest pain, palpitations and leg swelling.  Gastrointestinal: Negative for abdominal pain, heartburn and nausea.  Genitourinary: Negative for  frequency.  Musculoskeletal: Negative for joint pain and myalgias.  Skin: Negative for itching and rash.  Neurological: Negative for dizziness, weakness and headaches.  Endo/Heme/Allergies: Does not bruise/bleed easily.  Psychiatric/Behavioral: Negative for depression. The patient is not nervous/anxious.      Objective:   Vitals:   08/03/18 1111  BP: 118/74  Pulse: 71  Temp: 97.9 F (36.6 C)  TempSrc: Oral  SpO2: 98%  Weight: 203 lb 6.4 oz (92.3 kg)  Height: '5\' 9"'$  (1.753 m)   SpO2: 98 %  Physical Exam: General: Well-appearing, no acute distress HENT: [place], AT Eyes: EOMI, no scleral icterus Respiratory: Clear to auscultation bilaterally.  No crackles, wheezing or rales Cardiovascular: RRR, -M/R/G, no JVD GI: BS+, soft, nontender Extremities:-Edema,-tenderness Neuro: AAO x4, CNII-XII grossly intact Skin: Intact, no rashes or bruising Psych: Normal mood, normal affect  Data reviewed  Chest imaging:   CT 09/22/17 - Emphysema. S/p RLL wedge resection.   CT chest 04/13/2018-right lower lobe nodular consolidation adjacent to the wedge resection line that may represent scarring however cannot rule out tumor recurrence.  Interval development of solid left upper lobe nodule 5 mm.  Right upper lobe lung  nodule measured at 1.0 cm stable.  No mediastinal adenopathy noted.  Background emphysema  CT chest 07/27/2018-status post right lower lobe wedge resection.  Interval development of spiculated opacities in the left upper lobes measuring 2.0 x 0.9 cm and 1.0 cm and sub-solid opacity in right upper lobe measuring 5 mm compared to 04/13/2018 imaging.  Left upper lobe nodule stable at 5 mm.  Right upper lobe lung nodule measured at 1.0 cm stable.  Background emphysema  PFT: 01/26/17 FVC 2.86 (78 %) FEV1 1.97 (71 %) Ratio 71 Normal lung volumes including TLC. Moderately reduced DLCO. Interpretation: Mild obstructive defect with reduced DLCO suggestive of emphysema  01/31/18 FVC 2.56 (70 %) FEV1 1.68 (61 %) Ratio 65 TLC 75 % DLCO 44 % Interpretation: Mixed moderate obstructive and restrictive defect with moderately reduced DLCO  Labs CBC and CMP reviewed from 08/03/2018-WBC and hemoglobin 8.4 and 12.5, respectively.  Relative eosinophils 3.2% with absolute eosinophils 300  Imaging, labs and test noted above have been reviewed independently by me.    Assessment & Plan:   COPD with chronic bronchitis and emphysema Multiple lung nodules  Discussion: 73 year old female with COPD with chronic bronchitis and emphysema who presents for follow-up.  Prior PFTs demonstrate progressive worsening of obstructive defect and now mild restrictive defect.  Symptoms improved significantly after starting daily chronic macrolide therapy.  Will decrease azithromycin to every other day and monitor symptoms on decreased dose.  For her lung nodules, she is followed by Dr. Cyndia Bent in [place], who corresponded with prior to this visit.  Her right lower lobe opacity likely represents scarring.  I agree with continued surveillance of her new lung nodules/opacities since 04/2018.  If these lesions persist or enlarge, I would be concerned about recurrent malignancy however diagnostic testing will be difficult as patient will be high risk  for complications with her emphysema.  After reviewing her CT, I believe a navigational bronchoscopy would be risky in terms of potential complications and be of very low diagnostic yield due to the lack of available airways in the background of severe apical emphysema.  We discussed whether she would want to pursue aggressive treatment if this was indeed cancer and she stated she would think about it.  COPD Group D Classification --Decrease azithromycin to every other day. Will refill via [facility] --  Continue Advair 500 mg twice a day. With eosinophilia, patient continues to benefit from ICS therapy. --Continue scheduled Duonebs --Continue Pulmicort as needed for wheezing --Continue pulmonary rehab this week --Disability parking form has been completed this visit. Will need to reassess again at next visit  Multiple lung nodules --Followed by Dr. Cyndia Bent, CTS --Scheduled for repeat CT in 3 months  Greater than 50% of this patient 40-minute office visit was spent face-to-face in counseling with the patient/family. We discussed medical diagnosis and treatment plan as noted.   Orders Placed This Encounter  Procedures   CBC with Differential/Platelet    Standing Status:   Future    Number of Occurrences:   1    Standing Expiration Date:   08/03/2019   Comp Met (CMET)    Standing Status:   Future    Number of Occurrences:   1    Standing Expiration Date:   08/03/2019   EKG 12-Lead    Standing Status:   Future    Standing Expiration Date:   08/03/2019   EKG 12-Lead   Meds ordered this encounter  Medications   azithromycin (ZITHROMAX) 250 MG tablet    Sig: Take one tablet every other day.    Dispense:  90 tablet    Refill:  1    Return in about 3 months (around 11/03/2018).  Bradin Mcadory Rodman Pickle, MD [facility] 08/04/2018 3:18 PM  Personal pager: [phone] If unanswered, please page CCM On-call: [phone]

## 2018-08-03 NOTE — Patient Instructions (Addendum)
Emphysema Decrease azithromycin to every other day. Will refill via [facility] Continue Advair 500 mg twice a day Continue scheduled Duonebs Continue Pulmicort as needed for wheezing  Multiple lung nodules --Followed by Dr. Cyndia Bent, CTS --Scheduled for repeat CT in 3 months  Follow-up in 3 months

## 2018-08-04 ENCOUNTER — Encounter (HOSPITAL_COMMUNITY)
Admission: RE | Admit: 2018-08-04 | Discharge: 2018-08-04 | Disposition: A | Discharge status: Home / Self Care | Payer: Medicare Other | Source: Ambulatory Visit | Attending: Pulmonary Disease | Admitting: Pulmonary Disease

## 2018-08-04 ENCOUNTER — Encounter: Payer: Self-pay | Admitting: Pulmonary Disease

## 2018-08-04 DIAGNOSIS — J449 Chronic obstructive pulmonary disease, unspecified: Secondary | ICD-10-CM | POA: Diagnosis not present

## 2018-08-04 DIAGNOSIS — R918 Other nonspecific abnormal finding of lung field: Secondary | ICD-10-CM | POA: Insufficient documentation

## 2018-08-04 NOTE — Progress Notes (Signed)
Daily Session Note  Patient Details  Name: Ellen Bowers MRN: [id number] Date of Birth: July 15, 1945 Referring Provider:     PULMONARY REHAB COPD ORIENTATION from 03/17/2018 in [place]  Referring Provider  Loanne Drilling      Encounter Date: 08/04/2018  Check In: Session Check In - 08/04/18 1045      Check-In   Supervising physician immediately available to respond to emergencies  See telemetry face sheet for immediately available ER MD    Location  [facility]    Staff Present  Benay Pike, Exercise Physiologist;Debra Wynetta Emery, RN, BSN    Virtual Visit  No    Medication changes reported      No    Fall or balance concerns reported     No    Tobacco Cessation  No Change    Warm-up and Cool-down  Performed as group-led instruction    Resistance Training Performed  Yes    VAD Patient?  No    PAD/SET Patient?  No      Pain Assessment   Currently in Pain?  Other (Comment)    Multiple Pain Sites  No       Capillary Blood Glucose: Results for orders placed or performed in visit on 08/03/18 (from the past 24 hour(s))  Comp Met (CMET)     Status: None   Collection Time: 08/03/18 11:58 AM  Result Value Ref Range   Sodium 135 135 - 145 mEq/L   Potassium 4.1 3.5 - 5.1 mEq/L   Chloride 100 96 - 112 mEq/L   CO2 28 19 - 32 mEq/L   Glucose, Bld 89 70 - 99 mg/dL   BUN 15 6 - 23 mg/dL   Creatinine, Ser 0.87 0.40 - 1.20 mg/dL   Total Bilirubin 0.5 0.2 - 1.2 mg/dL   Alkaline Phosphatase 76 39 - 117 U/L   AST 19 0 - 37 U/L   ALT 20 0 - 35 U/L   Total Protein 7.1 6.0 - 8.3 g/dL   Albumin 4.4 3.5 - 5.2 g/dL   Calcium 10.0 8.4 - 10.5 mg/dL   GFR 63.76 >60.00 mL/min  CBC with Differential/Platelet     Status: Abnormal   Collection Time: 08/03/18 11:58 AM  Result Value Ref Range   WBC 8.4 4.0 - 10.5 K/uL   RBC 4.53 3.87 - 5.11 Mil/uL   Hemoglobin 12.5 12.0 - 15.0 g/dL   HCT 38.7 36.0 - 46.0 %   MCV 85.4 78.0 - 100.0 fl   MCHC 32.3 30.0 -  36.0 g/dL   RDW 14.8 11.5 - 15.5 %   Platelets 279.0 150.0 - 400.0 K/uL   Neutrophils Relative % 40.0 (L) 43.0 - 77.0 %   Lymphocytes Relative 47.9 (H) 12.0 - 46.0 %   Monocytes Relative 8.1 3.0 - 12.0 %   Eosinophils Relative 3.2 0.0 - 5.0 %   Basophils Relative 0.8 0.0 - 3.0 %   Neutro Abs 3.4 1.4 - 7.7 K/uL   Lymphs Abs 4.0 0.7 - 4.0 K/uL   Monocytes Absolute 0.7 0.1 - 1.0 K/uL   Eosinophils Absolute 0.3 0.0 - 0.7 K/uL   Basophils Absolute 0.1 0.0 - 0.1 K/uL      Social History   Tobacco Use  Smoking Status Former Smoker  . Packs/day: 1.00  . Years: 50.00  . Pack years: 50.00  . Types: Cigarettes  . Start date: 03/17/1961  . Quit date: 05/30/2009  . Years since quitting: 9.1  Smokeless  Tobacco Never Used  Tobacco Comment   Quit smoking x 7 years    Goals Met:  Proper associated with RPD/PD & O2 Sat Independence with exercise equipment Exercise tolerated well No report of cardiac concerns or symptoms Strength training completed today  Goals Unmet:  Not Applicable  Comments: Pt able to follow exercise prescription today without complaint.  Will continue to monitor for progression. Check out 1145.   Dr. Sinda Du is Medical Director for [facility].

## 2018-08-09 ENCOUNTER — Other Ambulatory Visit: Payer: Self-pay

## 2018-08-09 ENCOUNTER — Encounter (HOSPITAL_COMMUNITY)
Admission: RE | Admit: 2018-08-09 | Discharge: 2018-08-09 | Disposition: A | Discharge status: Home / Self Care | Payer: Medicare Other | Source: Ambulatory Visit | Attending: Pulmonary Disease | Admitting: Pulmonary Disease

## 2018-08-09 DIAGNOSIS — J449 Chronic obstructive pulmonary disease, unspecified: Secondary | ICD-10-CM

## 2018-08-09 NOTE — Progress Notes (Signed)
Incomplete Session Note  Patient Details  Name: Ellen Bowers MRN: [id number] Date of Birth: 11-Oct-1945 Referring Provider:     PULMONARY REHAB COPD ORIENTATION from 03/17/2018 in [place]  Referring Provider  [name] did not complete her rehab session.  When she arrived her blood pressure was 190/100 and she was advised to not workout because it would only cause her blood pressure to rise.

## 2018-08-11 ENCOUNTER — Encounter (HOSPITAL_COMMUNITY)
Admission: RE | Admit: 2018-08-11 | Discharge: 2018-08-11 | Disposition: A | Discharge status: Home / Self Care | Payer: Medicare Other | Source: Ambulatory Visit | Attending: Pulmonary Disease | Admitting: Pulmonary Disease

## 2018-08-11 ENCOUNTER — Other Ambulatory Visit: Payer: Self-pay

## 2018-08-11 DIAGNOSIS — J449 Chronic obstructive pulmonary disease, unspecified: Secondary | ICD-10-CM

## 2018-08-11 NOTE — Telephone Encounter (Signed)
Dr Loanne Drilling,  Pt is asking about her lab and EKG results Please advise, thanks!  msg from the pt:  I was unable to view my EKG on my chart.  Also-My Neutrophils, lymphocytes and basophils were different than normal.  I was just wondering if I need to follow up with something. Thanks

## 2018-08-11 NOTE — Progress Notes (Signed)
Daily Session Note  Patient Details  Name: CARMALETA YOUNGERS MRN: [id number] Date of Birth: October 12, 1945 Referring Provider:     PULMONARY REHAB COPD ORIENTATION from 03/17/2018 in [place]  Referring Provider  Loanne Drilling      Encounter Date: 08/11/2018  Check In: Session Check In - 08/11/18 1045      Check-In   Supervising physician immediately available to respond to emergencies  See telemetry face sheet for immediately available MD    Location  [facility]    Staff Present  Russella Dar, MS, EP, [facility], Exercise Physiologist;Lorea Kupfer Zachery Conch, Exercise Physiologist    Virtual Visit  No    Medication changes reported      Yes    Comments  Dr. started Norvasc.    Fall or balance concerns reported     No    Tobacco Cessation  No Change    Warm-up and Cool-down  Performed as group-led instruction    Resistance Training Performed  Yes    VAD Patient?  No    PAD/SET Patient?  No      Pain Assessment   Currently in Pain?  No/denies    Multiple Pain Sites  No       Capillary Blood Glucose: No results found for this or any previous visit (from the past 24 hour(s)).    Social History   Tobacco Use  Smoking Status Former Smoker  . Packs/day: 1.00  . Years: 50.00  . Pack years: 50.00  . Types: Cigarettes  . Start date: 03/17/1961  . Quit date: 05/30/2009  . Years since quitting: 9.2  Smokeless Tobacco Never Used  Tobacco Comment   Quit smoking x 7 years    Goals Met:  Proper associated with RPD/PD & O2 Sat Independence with exercise equipment Using PLB without cueing & demonstrates good technique Exercise tolerated well No report of cardiac concerns or symptoms Strength training completed today  Goals Unmet:  Not Applicable  Comments: Pt able to follow exercise prescription today without complaint.  Will continue to monitor for progression. Check out 1145.   Dr. Sinda Du is Medical Director for [facility].

## 2018-08-15 ENCOUNTER — Telehealth: Payer: Self-pay | Admitting: Pulmonary Disease

## 2018-08-15 NOTE — Telephone Encounter (Signed)
Will await to see if pt returns call so we can give her the results posted by Dr. Loanne Drilling.

## 2018-08-15 NOTE — Telephone Encounter (Signed)
Pulmonary Telephone Encounter  I called patient, no answer and [facility]. If patient calls back, please provide the following information. I have also sent this via mychart.  Regarding your results, no follow-up is needed.  EKG - Your cardiac activity is normal. The only abnormal part is the low voltage activity which can be seen based on positioning of the ekg leads and distance your heart is from chest cavity. Otherwise, the conduction activity is normal-appearing.  For your blood counts, neutrophils (N), lymphocytes (L) and basophils (B) make up a proportion of your white blood cells. Your total white blood cells is within appropriate range.  The numbers you see for N, L and B are only slightly above or below normal range. This does not have any clinical significance at this time and do not require follow-up.  Hopefully this answers your questions. Please call if you think of additional questions or concerns.  Sincerely, Rodman Pickle, MD

## 2018-08-16 ENCOUNTER — Encounter (HOSPITAL_COMMUNITY)
Admission: RE | Admit: 2018-08-16 | Discharge: 2018-08-16 | Disposition: A | Discharge status: Home / Self Care | Payer: Medicare Other | Source: Ambulatory Visit | Attending: Pulmonary Disease | Admitting: Pulmonary Disease

## 2018-08-16 ENCOUNTER — Other Ambulatory Visit: Payer: Self-pay

## 2018-08-16 DIAGNOSIS — J449 Chronic obstructive pulmonary disease, unspecified: Secondary | ICD-10-CM | POA: Insufficient documentation

## 2018-08-16 NOTE — Progress Notes (Signed)
Daily Session Note  Patient Details  Name: Ellen Bowers MRN: [id number] Date of Birth: February 03, 1945 Referring Provider:     PULMONARY REHAB COPD ORIENTATION from 03/17/2018 in [place]  Referring Provider  Loanne Drilling      Encounter Date: 08/16/2018  Check In: Session Check In - 08/16/18 1045      Check-In   Supervising physician immediately available to respond to emergencies  See telemetry face sheet for immediately available MD    Location  [facility]    Staff Present  Russella Dar, MS, EP, [facility], Exercise Physiologist;Sigfredo Schreier, Exercise Physiologist;Debra Wynetta Emery, RN, BSN    Virtual Visit  No    Medication changes reported      No    Fall or balance concerns reported     No    Tobacco Cessation  No Change    Warm-up and Cool-down  Performed as group-led instruction    Resistance Training Performed  Yes    VAD Patient?  No    PAD/SET Patient?  No      Pain Assessment   Currently in Pain?  No/denies    Multiple Pain Sites  No       Capillary Blood Glucose: No results found for this or any previous visit (from the past 24 hour(s)).    Social History   Tobacco Use  Smoking Status Former Smoker  . Packs/day: 1.00  . Years: 50.00  . Pack years: 50.00  . Types: Cigarettes  . Start date: 03/17/1961  . Quit date: 05/30/2009  . Years since quitting: 9.2  Smokeless Tobacco Never Used  Tobacco Comment   Quit smoking x 7 years    Goals Met:  Proper associated with RPD/PD & O2 Sat Independence with exercise equipment Using PLB without cueing & demonstrates good technique Exercise tolerated well No report of cardiac concerns or symptoms Strength training completed today  Goals Unmet:  Not Applicable  Comments: Pt able to follow exercise prescription today without complaint.  Will continue to monitor for progression. Check out 1145.   Dr. Sinda Du is Medical Director for [facility] Pul[facility].

## 2018-08-16 NOTE — Telephone Encounter (Signed)
Called and spoke with patient. She looked at her mychart and had already seen results.   No further questions and nothing further needed

## 2018-08-17 ENCOUNTER — Other Ambulatory Visit: Payer: Self-pay

## 2018-08-17 ENCOUNTER — Encounter: Payer: Self-pay | Admitting: Orthopaedic Surgery

## 2018-08-17 ENCOUNTER — Ambulatory Visit: Payer: Self-pay

## 2018-08-17 ENCOUNTER — Ambulatory Visit (INDEPENDENT_AMBULATORY_CARE_PROVIDER_SITE_OTHER): Payer: Medicare Other | Admitting: Orthopaedic Surgery

## 2018-08-17 VITALS — BP 151/84 | HR 80 | Ht 69.5 in | Wt 204.0 lb

## 2018-08-17 DIAGNOSIS — G8929 Other chronic pain: Secondary | ICD-10-CM

## 2018-08-17 DIAGNOSIS — M1711 Unilateral primary osteoarthritis, right knee: Secondary | ICD-10-CM | POA: Diagnosis not present

## 2018-08-17 DIAGNOSIS — M545 Low back pain, unspecified: Secondary | ICD-10-CM | POA: Insufficient documentation

## 2018-08-17 MED ORDER — BUPIVACAINE HCL 0.5 % IJ SOLN
2.0000 mL | INTRAMUSCULAR | Status: AC | PRN
  Administered 2018-08-17: 2 mL via INTRA_ARTICULAR

## 2018-08-17 MED ORDER — LIDOCAINE HCL 1 % IJ SOLN
2.0000 mL | INTRAMUSCULAR | Status: AC | PRN
  Administered 2018-08-17: 2 mL

## 2018-08-17 MED ORDER — METHYLPREDNISOLONE ACETATE 40 MG/ML IJ SUSP
80.0000 mg | INTRAMUSCULAR | Status: AC | PRN
  Administered 2018-08-17: 80 mg via INTRA_ARTICULAR

## 2018-08-17 NOTE — Progress Notes (Signed)
Office Visit Note   Patient: Ellen Bowers           Date of Birth: 09/10/1945           MRN: [id number] Visit Date: 08/17/2018              Requested by: [name], MD [address] [place],  [place] [address] PCP: [name], MD   Assessment & Plan: Visit Diagnoses:  1. Chronic bilateral low back pain, unspecified whether sciatica present   2. Unilateral primary osteoarthritis, right knee   3. Chronic bilateral low back pain without sciatica     Plan: Recurrent symptoms of osteoarthritis right knee.  Will reinject with cortisone and monitor response.  Always aware of consideration of knee replacement.  Also having some recurrent low back and bilateral hip pain.  Has had diagnosis of right hip bursitis in the past with good response to cortisone.  Also has had MRI scan in the distant past of the lumbar spine with cortisone injection.  No new injury or trauma.  Will reevaluate over the next several weeks to consider repeat cortisone injection over the greater trochanter right hip  Follow-Up Instructions: No follow-ups on file.   Orders:  Orders Placed This Encounter  Procedures  . Large Joint Inj: R knee  . XR Pelvis 1-2 Views  . XR Lumbar Spine 2-3 Views   No orders of the defined types were placed in this encounter.     Procedures: Large Joint Inj: R knee on 08/17/2018 12:25 PM Indications: pain and diagnostic evaluation Details: 25 G 1.5 in needle, anteromedial approach  Arthrogram: No  Medications: 2 mL lidocaine 1 %; 2 mL bupivacaine 0.5 %; 80 mg methylPREDNISolone acetate 40 MG/ML Procedure, treatment alternatives, risks and benefits explained, specific risks discussed. Consent was given by the patient. Immediately prior to procedure a time out was called to verify the correct patient, procedure, equipment, support staff and site/side marked as required. Patient was prepped and draped in the usual sterile fashion.       Clinical Data: No additional findings.    Subjective: Chief Complaint  Patient presents with  . Right Knee - Follow-up  Patient presents today for recurrent right knee pain. She was last seen on 04/05/2018 and received a right knee cortisone injection. Patient states that the injection last a little over two months. She is wanting to get another injection today. Patient also has complaints of chronic bilateral hip pain. She said that it has started to run down her lateral right leg in the last week. She does state that she has some lower back pain. 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  Has had prior diagnosis of greater trochanteric bursitis right greater than left hip with good response to cortisone in the past.  Also has a history of chronic low back pain.  Has had prior MRI scan performed through the [facility] office with subsequent cortisone injections in her back. not having any bowel or bladder changes.  No numbness or tingling  HPI  Review of Systems   Objective: Vital Signs: BP (!) 151/84   Pulse 80   Ht 5' 9.5" (1.765 m)   Wt 204 lb (92.5 kg)   BMI 29.69 kg/m   Physical Exam Constitutional:      Appearance: She is well-developed.  Eyes:     Pupils: Pupils are equal, round, and reactive to light.  Pulmonary:     Effort: Pulmonary effort is normal.  Skin:  General: Skin is warm and dry.  Neurological:     Mental Status: She is alert and oriented to person, place, and time.  Psychiatric:        Behavior: Behavior normal.     Ortho Exam awake alert and oriented x3.  Comfortable sitting.  Straight leg raise negative.  Right knee with small effusion and predominant lateral joint pain.  Some patellar crepitation.  Flexed over 100 degrees.  Flexed over 105 degrees without instability.  No calf pain.  Straight leg raise negative.  Painless range of motion both hips.  Does have some local tenderness over the greater trochanter of her right hip without crepitation Specialty Comments:  No specialty comments available.   Imaging: Xr Lumbar Spine 2-3 Views  Result Date: 08/17/2018 Films of the lumbar spine were obtained in the [facility] and lateral projection.  There are facet sclerotic changes at L4-5 and L5-S1.  Slight anterior listhesis of L4 on 5.  Anterior traction spurs throughout the lower lumbar spine.  There is calcification of the abdominal aorta without obvious aneurysmal dilatation.  No obvious compromise of the posterior canal. minimal left lower lumbar scoliosis  Xr Pelvis 1-2 Views  Result Date: 08/17/2018 [facility] the pelvis did not demonstrate any obvious degenerative changes of either hip.  There is some irregularity about the greater trochanters bilaterally but no ectopic calcification.  No evidence of fracture.  Abundant bowel gas.  Some sclerosis about the sacroiliac joints    PMFS History: Patient Active Problem List   Diagnosis Date Noted  . Low back pain 08/17/2018  . Lung nodules 08/04/2018  . Constipation 05/17/2018  . Chronic pain of right knee 12/29/2017  . 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 Unilateral primary osteoarthritis, right knee 08/18/2017  . Lung cancer, lower lobe ([name]) 02/08/2017  . Dyspepsia   . Anorectal fissure 10/24/2014  . History of colon polyps 10/24/2014  . COPD with chronic bronchitis and emphysema ([place]) 06/07/2014  . Hemorrhoids 05/07/2014  . GERD (gastroesophageal reflux disease) 09/06/2013   Past Medical History:  Diagnosis Date  . Anxiety   . Arthritis    R knee, bilateral hips, hands & neck- being given steroid injections   . Asthma   . Blood dyscrasia   . Cancer (HCC)    L hand  . COPD (chronic obstructive pulmonary disease) ([place])   . Depression   . Dyspnea   . GERD (gastroesophageal reflux disease)   . Helicobacter pylori gastritis 2005  . Hyperlipidemia   . 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 Von Willebrand disease ([place]) 1996    Family History  Problem Relation Age of Onset  . Colon cancer Mother        Less than age 46   . Colon cancer Father        More than age 12, died from metastatic disease at age 52  . Liver disease Neg Hx     Past Surgical History:  Procedure Laterality Date  . BIOPSY  12/08/2016   Procedure: BIOPSY;  Surgeon: Danie Binder, MD;  Location: [facility];  Service: Endoscopy;;  . BRAVO [place] STUDY N/A 12/08/2016   Procedure: BRAVO [place]; Bravo Capsule Placement;  Surgeon: Danie Binder, MD;  Location[facility];  Service: Endoscopy;  Laterality: N/A;  . BREAST LUMPECTOMY Left    benign  . CATARACT EXTRACTION     double  . COLONOSCOPY N/A 05/30/2012  Dr.Mann:Multiple polyps removed from the right colon-see description above; otherwise normal colonoscopy up to the cecum. 5 polyps, multiple tubular adenomas. next tcs 05/2015  . COLONOSCOPY N/A 12/13/2015   Procedure: COLONOSCOPY;  Surgeon: Danie Binder, MD;  Location: [facility];  Service: Endoscopy;  Laterality: N/A;  8:30 am  . ESOPHAGOGASTRODUODENOSCOPY  2012   Dr. Collene Mares: few erosions in duodenal bulb. BRAVO off PPI ("severe reflux")  . ESOPHAGOGASTRODUODENOSCOPY N/A 06/12/2014   SLF: 1. The mucosa of the esophagus appeared normal 2. Mild non-erosive gastrtitis.   [name] ESOPHAGOGASTRODUODENOSCOPY (EGD) WITH PROPOFOL N/A 12/08/2016   Procedure: ESOPHAGOGASTRODUODENOSCOPY (EGD) WITH PROPOFOL;  Surgeon: Danie Binder, MD;  Location: [facility];  Service: Endoscopy;  Laterality: N/A;  7:30am  . EYE SURGERY Bilateral    L& R  . FLEXIBLE SIGMOIDOSCOPY N/A 02/05/2014   Dr. Oneida Alar: 5, 2-3 mm sessile sigmoid colon polyps removed, small internal hemorrhoids, moderate external hemorrhoids, 2 anal fissures present. Polyps were hyperplastic.   [name] NECK SURGERY  1951   cyst removal  . NECK SURGERY  1998   growth removed  . THORACOTOMY Right 02/08/2017   Procedure: THORACOTOMY MAJOR;  Surgeon: Gaye Pollack, MD;  Location: [facility] OR;  Service: Thoracic;  Laterality: Right;  . TONSILLECTOMY  1951  . TUBAL LIGATION  1984  . VIDEO  ASSISTED THORACOSCOPY (VATS)/WEDGE RESECTION Right 02/08/2017   Procedure: VIDEO ASSISTED THORACOSCOPY (VATS)/WEDGE RESECTION;  Surgeon: Gaye Pollack, MD;  Location: [facility] OR;  Service: Thoracic;  Laterality: Right;   Social History   Occupational History  . Not on file  Tobacco Use  . Smoking status: Former Smoker    Packs/day: 1.00    Years: 50.00    Pack years: 50.00    Types: Cigarettes    Start date: 03/17/1961    Quit date: 05/30/2009    Years since quitting: 9.2  . Smokeless tobacco: Never Used  . Tobacco comment: Quit smoking x 7 years  Substance and Sexual Activity  . Alcohol use: Not Currently    Comment: Occasionally    . Drug use: No  . Sexual activity: Not on file

## 2018-08-18 ENCOUNTER — Encounter (HOSPITAL_COMMUNITY): Payer: Medicare Other

## 2018-08-23 ENCOUNTER — Encounter (HOSPITAL_COMMUNITY)
Admission: RE | Admit: 2018-08-23 | Discharge: 2018-08-23 | Disposition: A | Discharge status: Home / Self Care | Payer: Medicare Other | Source: Ambulatory Visit | Attending: Pulmonary Disease | Admitting: Pulmonary Disease

## 2018-08-23 ENCOUNTER — Other Ambulatory Visit: Payer: Self-pay

## 2018-08-23 DIAGNOSIS — J449 Chronic obstructive pulmonary disease, unspecified: Secondary | ICD-10-CM

## 2018-08-23 NOTE — Progress Notes (Signed)
Daily Session Note  Patient Details  Name: KATELYND BLAUVELT MRN: [id number] Date of Birth: 1945-01-15 Referring Provider:     PULMONARY REHAB COPD ORIENTATION from 03/17/2018 in [place]  Referring Provider  Loanne Drilling      Encounter Date: 08/23/2018  Check In: Session Check In - 08/23/18 1115      Check-In   Supervising physician immediately available to respond to emergencies  See telemetry face sheet for immediately available MD    Location  [facility]    Staff Present  Russella Dar, MS, EP, [facility], Exercise Physiologist;Debra Wynetta Emery, RN, [facility], Exercise Physiologist    Virtual Visit  No    Medication changes reported      No    Fall or balance concerns reported     No    Tobacco Cessation  No Change    Warm-up and Cool-down  Performed as group-led instruction    Resistance Training Performed  Yes    VAD Patient?  No    PAD/SET Patient?  No      Pain Assessment   Currently in Pain?  No/denies    Multiple Pain Sites  No       Capillary Blood Glucose: No results found for this or any previous visit (from the past 24 hour(s)).    Social History   Tobacco Use  Smoking Status Former Smoker  . Packs/day: 1.00  . Years: 50.00  . Pack years: 50.00  . Types: Cigarettes  . Start date: 03/17/1961  . Quit date: 05/30/2009  . Years since quitting: 9.2  Smokeless Tobacco Never Used  Tobacco Comment   Quit smoking x 7 years    Goals Met:  Independence with exercise equipment Exercise tolerated well Personal goals reviewed No report of cardiac concerns or symptoms Strength training completed today  Goals Unmet:  Not Applicable  Comments: Check  Out: 11:30   Dr. Kate Sable is Medical Director for [place] and Pulmonary Rehab.

## 2018-08-24 ENCOUNTER — Ambulatory Visit (INDEPENDENT_AMBULATORY_CARE_PROVIDER_SITE_OTHER): Payer: Medicare Other | Admitting: Orthopaedic Surgery

## 2018-08-24 ENCOUNTER — Other Ambulatory Visit: Payer: Self-pay

## 2018-08-24 ENCOUNTER — Encounter: Payer: Self-pay | Admitting: Orthopaedic Surgery

## 2018-08-24 DIAGNOSIS — M7061 Trochanteric bursitis, right hip: Secondary | ICD-10-CM | POA: Diagnosis not present

## 2018-08-24 MED ORDER — METHYLPREDNISOLONE ACETATE 40 MG/ML IJ SUSP
80.0000 mg | INTRAMUSCULAR | Status: AC | PRN
  Administered 2018-08-24: 13:00:00 80 mg via INTRA_ARTICULAR

## 2018-08-24 MED ORDER — LIDOCAINE HCL 1 % IJ SOLN
2.0000 mL | INTRAMUSCULAR | Status: AC | PRN
  Administered 2018-08-24: 13:00:00 2 mL

## 2018-08-24 MED ORDER — BUPIVACAINE HCL 0.5 % IJ SOLN
2.0000 mL | INTRAMUSCULAR | Status: AC | PRN
  Administered 2018-08-24: 13:00:00 2 mL via INTRA_ARTICULAR

## 2018-08-24 NOTE — Progress Notes (Signed)
Pulmonary Individual Treatment Plan  Patient Details  Name: Ellen Bowers MRN: [id number] Date of Birth: [birth year] Referring Provider:     PULMONARY REHAB COPD ORIENTATION from 03/17/2018 in [place]  Referring Provider  Loanne Drilling      Initial Encounter Date:    PULMONARY REHAB COPD ORIENTATION from 03/17/2018 in [place]  Date  03/17/18      Visit Diagnosis: Chronic obstructive pulmonary disease, unspecified COPD type ([place])   Patient's Home Medications on Admission:   Current Outpatient Medications:  .  acetaminophen (TYLENOL) 500 MG tablet, Take 1,000 mg by mouth 2 (two) times daily as needed for moderate pain or headache., Disp: , Rfl:  .  albuterol (PROVENTIL HFA;VENTOLIN HFA) 108 (90 Base) MCG/ACT inhaler, Inhale 2 puffs into the lungs every 6 (six) hours as needed for wheezing or shortness of breath., Disp: , Rfl:  .  alum & mag hydroxide-simeth (MAALOX/MYLANTA) 200-200-20 MG/5ML suspension, Take 15 mLs by mouth every 6 (six) hours as needed for indigestion or heartburn. , Disp: , Rfl:  .  amLODipine (NORVASC) 2.5 MG tablet, Take 2.5 mg by mouth daily., Disp: , Rfl:  .  Ascorbic Acid (VITAMIN C) 1000 MG tablet, Take 1,000 mg by mouth daily. , Disp: , Rfl:  .  azelastine (ASTELIN) 0.1 % nasal spray, Place 2 sprays into both nostrils daily as needed for rhinitis. Use in each nostril as directed, Disp: , Rfl:  .  azithromycin (ZITHROMAX) 250 MG tablet, Take one tablet every other day., Disp: 90 tablet, Rfl: 1 .  b complex vitamins tablet, Take 1 tablet by mouth daily., Disp: , Rfl:  .  budesonide (PULMICORT) 0.5 MG/2ML nebulizer solution, Take 0.5 mg by nebulization as needed (for shortness of breath/wheezing.). , Disp: , Rfl:  .  buPROPion (WELLBUTRIN SR) 200 MG 12 hr tablet, Take 200 mg by mouth 2 (two) times daily., Disp: , Rfl:  .  Calcium-Magnesium (CAL-MAG PO), Take 1 tablet by mouth at bedtime., Disp: , Rfl:  .   carboxymethylcellulose (REFRESH PLUS) 0.5 % SOLN, Place 1-2 drops into both eyes 2 (two) times daily as needed (for dry eyes). REFRESH, Disp: , Rfl:  .  celecoxib (CELEBREX) 200 MG capsule, Take 200 mg by mouth 2 (two) times daily., Disp: , Rfl:  .  cetirizine (ZYRTEC) 10 MG tablet, Take 10 mg by mouth daily., Disp: , Rfl:  .  Cholecalciferol (VITAMIN D PO), Take by mouth daily. Unsure of dose, Disp: , Rfl:  .  diclofenac (FLECTOR) 1.3 % PTCH, Place 0.25 patches onto the skin as needed. 1/4TH OF PATCH DAILY , Disp: , Rfl:  .  diclofenac sodium (VOLTAREN) 1 % GEL, Apply 4 g topically 4 (four) times daily. (Patient taking differently: Apply 4 g topically as needed. ), Disp: 3 Tube, Rfl: 4 .  docusate sodium (COLACE) 100 MG capsule, Take 100 mg by mouth 2 (two) times daily. , Disp: , Rfl:  .  doxylamine, Sleep, (UNISOM) 25 MG tablet, Take 12.5 mg by mouth at bedtime. , Disp: , Rfl:  .  fluticasone (FLONASE) 50 MCG/ACT nasal spray, Place 2 sprays into both nostrils daily as needed for allergies or rhinitis., Disp: , Rfl:  .  Fluticasone-Salmeterol (ADVAIR) 500-50 MCG/DOSE AEPB, Inhale 1 puff into the lungs 2 (two) times daily. , Disp: , Rfl:  .  HYDROcodone-homatropine (HYCODAN) 5-1.5 MG/5ML syrup, TAKE 1 TEASPOONFUL (5ML) BY MOUTH EVERY 6 HOURS AS NEEDED, Disp: 473 mL, Rfl: 0 .  ipratropium-albuterol (  DUONEB) 0.5-2.5 (3) MG/3ML SOLN, Take 3 mLs by nebulization 3 (three) times daily. (Patient taking differently: Take 3 mLs by nebulization 3 (three) times daily. Scheduled twice daily, may use additional dose as needed), Disp: 360 mL, Rfl: 11 .  Misc Natural Products (MIDNITE PO), Take 1 tablet by mouth at bedtime., Disp: , Rfl:  .  montelukast (SINGULAIR) 10 MG tablet, Take 10 mg by mouth daily. , Disp: , Rfl:  .  ondansetron (ZOFRAN) 4 MG tablet, Take 1 tablet (4 mg total) by mouth every 8 (eight) hours as needed for nausea or vomiting., Disp: 20 tablet, Rfl: 0 .  pantoprazole (PROTONIX) 40 MG tablet,  TAKE ONE TABLET BY MOUTH TWICE DAILY - 30 MINUTES BEFORE BREAKFAST & DINNER, Disp: 180 tablet, Rfl: 3 .  pravastatin (PRAVACHOL) 20 MG tablet, Take 20 mg by mouth at bedtime. , Disp: , Rfl:  .  Probiotic Product (PROBIOTIC DAILY PO), Take by mouth daily., Disp: , Rfl:  .  solifenacin (VESICARE) 5 MG tablet, Take 5 mg by mouth at bedtime. , Disp: , Rfl:   Past Medical History: Past Medical History:  Diagnosis Date  . Anxiety   . Arthritis    R knee, bilateral hips, hands & neck- being given steroid injections   . Asthma   . Blood dyscrasia   . Cancer (HCC)    L hand  . COPD (chronic obstructive pulmonary disease) ([place])   . Depression   . Dyspnea   . GERD (gastroesophageal reflux disease)   . Helicobacter pylori gastritis 2005  . Hyperlipidemia   . 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 Von Willebrand disease ([place]) 1996    Tobacco Use: Social History   Tobacco Use  Smoking Status Former Smoker  . Packs/day: 1.00  . Years: 50.00  . Pack years: 50.00  . Types: Cigarettes  . Start date: 03/17/1961  . Quit date: 05/30/2009  . Years since quitting: 9.2  Smokeless Tobacco Never Used  Tobacco Comment   Quit smoking x 7 years    Labs: Recent Review Flowsheet Data    Labs for ITP Cardiac and Pulmonary Rehab Latest Ref Rng & Units 02/04/2017 02/08/2017 02/09/2017   PHART 7.350 - 7.450 7.461(H) 7.338(L) 7.423   PCO2ART 32.0 - 48.0 mmHg 33.1 48.6(H) 37.6   HCO3 20.0 - 28.0 mmol/L 23.3 26.3 24.1   TCO2 22 - 32 mmol/L - 28 -   ACIDBASEDEF 0.0 - 2.0 mmol/L 0.1 - -   O2SAT % 96.9 95.0 98.4      Capillary Blood Glucose: Lab Results  Component Value Date   GLUCAP 119 (H) 02/10/2017   GLUCAP 147 (H) 02/09/2017   GLUCAP 136 (H) 02/09/2017   GLUCAP 141 (H) 02/09/2017   GLUCAP 131 (H) 02/09/2017     Pulmonary Assessment Scores: Pulmonary Assessment Scores    Row Name 03/17/18 1132         ADL UCSD   ADL Phase  Entry     SOB Score total  50     Rest  0     Walk   5     Stairs  3     Bath  3     Dress  2     Shop  1       CAT Score   CAT Score  22       mMRC Score   mMRC Score  3  UCSD: Self-administered rating of dyspnea associated with activities of daily living (ADLs) 6-point scale (0 = "not at all" to 5 = "maximal or unable to do because of breathlessness")  Scoring Scores range from 0 to 120.  Minimally important difference is 5 units  CAT: CAT can identify the health impairment of COPD patients and is better correlated with disease progression.  CAT has a scoring range of zero to 40. The CAT score is classified into four groups of low (less than 10), medium (10 - 20), high (21-30) and very high (31-40) based on the impact level of disease on health status. A CAT score over 10 suggests significant symptoms.  A worsening CAT score could be explained by an exacerbation, poor medication adherence, poor inhaler technique, or progression of COPD or comorbid conditions.  CAT MCID is 2 points  mMRC: mMRC (Modified Medical Research Council) Dyspnea Scale is used to assess the degree of baseline functional disability in patients of respiratory disease due to dyspnea. No minimal important difference is established. A decrease in score of 1 point or greater is considered a positive change.   Pulmonary Function Assessment: Pulmonary Function Assessment - 03/17/18 1125      Pulmonary Function Tests   FVC%  70 %    FEV1%  60 %    FEV1/FVC Ratio  86    RV%  79 %    DLCO%  44 %      Initial Spirometry Results   FVC%  70 %    FEV1%  60 %    FEV1/FVC Ratio  86      Post Bronchodilator Spirometry Results   FVC%  68 %    FEV1%  61 %    FEV1/FVC Ratio  68      Breath   Bilateral Breath Sounds  Wheezes    Shortness of Breath  Yes;Limiting activity       Exercise Target Goals: Exercise Program Goal: Individual exercise prescription set using results from initial 6 min walk test and THRR while considering  patient's activity barriers  and safety.   Exercise Prescription Goal: Initial exercise prescription builds to 30-45 minutes a day of aerobic activity, 2-3 days per week.  Home exercise guidelines will be given to patient during program as part of exercise prescription that the participant will acknowledge.  Activity Barriers & Risk Stratification: Activity Barriers & Cardiac Risk Stratification - 03/17/18 1009      Activity Barriers & Cardiac Risk Stratification   Activity Barriers  Muscular Weakness;Shortness of Breath;Deconditioning;Other (comment)    Comments  R knee pain, bilateral hip pain     Cardiac Risk Stratification  High       6 Minute Walk: 6 Minute Walk    Row Name 03/17/18 1007         6 Minute Walk   Phase  Initial     Distance  1200 feet     Walk Time  6 minutes     # of Rest Breaks  0     MPH  2.27     METS  2.74     RPE  13     Perceived Dyspnea   14     VO2 Peak  9.64     Symptoms  Yes (comment)     Comments  2/10 R knee pain, 3/10 bilateral hip pain, lightheadedness      Resting HR  81 bpm     Resting BP  128/70  Resting Oxygen Saturation   99 %     Exercise Oxygen Saturation  during 6 min walk  91 %     Max Ex. HR  106 bpm     Max Ex. BP  160/80     2 Minute Post BP  136/76        Oxygen Initial Assessment: Oxygen Initial Assessment - 03/17/18 1124      Home Oxygen   Home Oxygen Device  None    Sleep Oxygen Prescription  None    Home Exercise Oxygen Prescription  None    Home at Rest Exercise Oxygen Prescription  None    Compliance with Home Oxygen Use  --   N/A     Initial 6 min Walk   Oxygen Used  None      Program Oxygen Prescription   Program Oxygen Prescription  None       Oxygen Re-Evaluation: Oxygen Re-Evaluation    Row Name 04/04/18 1450 08/02/18 1343 08/24/18 1432         Program Oxygen Prescription   Program Oxygen Prescription  None  None  None       Home Oxygen   Home Oxygen Device  None  None  None     Sleep Oxygen Prescription  None   None  None     Home Exercise Oxygen Prescription  None  None  None     Home at Rest Exercise Oxygen Prescription  -  None  None     Compliance with Home Oxygen Use  - N/A  -  -       Goals/Expected Outcomes   Short Term Goals  To learn and understand importance of monitoring SPO2 with pulse oximeter and demonstrate accurate use of the pulse oximeter.;To learn and understand importance of maintaining oxygen saturations>88%;To learn and demonstrate proper pursed lip breathing techniques or other breathing techniques.  To learn and understand importance of monitoring SPO2 with pulse oximeter and demonstrate accurate use of the pulse oximeter.;To learn and understand importance of maintaining oxygen saturations>88%;To learn and demonstrate proper pursed lip breathing techniques or other breathing techniques.  To learn and understand importance of monitoring SPO2 with pulse oximeter and demonstrate accurate use of the pulse oximeter.;To learn and understand importance of maintaining oxygen saturations>88%;To learn and demonstrate proper pursed lip breathing techniques or other breathing techniques.     Long  Term Goals  Maintenance of O2 saturations>88%;Exhibits proper breathing techniques, such as pursed lip breathing or other method taught during program session;Verbalizes importance of monitoring SPO2 with pulse oximeter and return demonstration  Maintenance of O2 saturations>88%;Exhibits proper breathing techniques, such as pursed lip breathing or other method taught during program session;Verbalizes importance of monitoring SPO2 with pulse oximeter and return demonstration  Maintenance of O2 saturations>88%;Exhibits proper breathing techniques, such as pursed lip breathing or other method taught during program session;Verbalizes importance of monitoring SPO2 with pulse oximeter and return demonstration     Comments  Patient is able to verbalize the importance of maintaining her O2Sat >88%. She is also  able to demonstrate proper usage of pulse oximetry and proper pursed lip breathing techniques during the sessions.   Patient is able to verbalize the importance of maintaining her O2Sat >88%. She is also able to demonstrate proper usage of pulse oximetry and proper pursed lip breathing techniques during the sessions.   Patient is able to verbalize the importance of maintaining her O2Sat >88%. She is also able to demonstrate proper usage of  pulse oximetry and proper pursed lip breathing techniques during the sessions.      Goals/Expected Outcomes  Patient will continue to meet both her short and long term goals.   Patient will continue to meet both her short and long term goals.   Patient will continue to meet both her short and long term goals.         Oxygen Discharge (Final Oxygen Re-Evaluation): Oxygen Re-Evaluation - 08/24/18 1432      Program Oxygen Prescription   Program Oxygen Prescription  None      Home Oxygen   Home Oxygen Device  None    Sleep Oxygen Prescription  None    Home Exercise Oxygen Prescription  None    Home at Rest Exercise Oxygen Prescription  None      Goals/Expected Outcomes   Short Term Goals  To learn and understand importance of monitoring SPO2 with pulse oximeter and demonstrate accurate use of the pulse oximeter.;To learn and understand importance of maintaining oxygen saturations>88%;To learn and demonstrate proper pursed lip breathing techniques or other breathing techniques.    Long  Term Goals  Maintenance of O2 saturations>88%;Exhibits proper breathing techniques, such as pursed lip breathing or other method taught during program session;Verbalizes importance of monitoring SPO2 with pulse oximeter and return demonstration    Comments  Patient is able to verbalize the importance of maintaining her O2Sat >88%. She is also able to demonstrate proper usage of pulse oximetry and proper pursed lip breathing techniques during the sessions.     Goals/Expected  Outcomes  Patient will continue to meet both her short and long term goals.        Initial Exercise Prescription: Initial Exercise Prescription - 03/17/18 1000      Date of Initial Exercise RX and Referring Provider   Date  03/17/18    Referring Provider  Loanne Drilling    Expected Discharge Date  06/17/18      Treadmill   MPH  1    Grade  0    Minutes  17    METs  1.88      NuStep   Level  1    SPM  40    Minutes  17    METs  1.8      Prescription Details   Frequency (times per week)  2    Duration  Progress to 30 minutes of continuous aerobic without signs/symptoms of physical distress      Intensity   THRR 40-80% of Max Heartrate  107-121-134    Ratings of Perceived Exertion  11-13    Perceived Dyspnea  0-4      Progression   Progression  Continue to progress workloads to maintain intensity without signs/symptoms of physical distress.      Resistance Training   Training Prescription  Yes    Weight  1    Reps  10-15       Perform Capillary Blood Glucose checks as needed.  Exercise Prescription Changes:  Exercise Prescription Changes    Row Name 03/17/18 1000 04/04/18 1100 07/28/18 1300 08/23/18 1200       Response to Exercise   Blood Pressure (Admit)  128/70  130/58  138/78  150/70    Blood Pressure (Exercise)  160/80  160/80  152/66  170/88    Blood Pressure (Exit)  136/76  144/60  142/68  160/80    Heart Rate (Admit)  81 bpm  77 bpm  85 bpm  84 bpm  Heart Rate (Exercise)  106 bpm  91 bpm  105 bpm  111 bpm    Heart Rate (Exit)  78 bpm  71 bpm  94 bpm  92 bpm    Oxygen Saturation (Admit)  99 %  97 %  95 %  94 %    Oxygen Saturation (Exercise)  91 %  97 %  94 %  96 %    Oxygen Saturation (Exit)  98 %  99 %  95 %  98 %    Rating of Perceived Exertion (Exercise)  13  11  13  13     Perceived Dyspnea (Exercise)  14  12  14  14     Symptoms  R knee pain, bilateral hip pain   -  -  -    Comments  6 minute walk test  first week of exercise   -  -    Duration   Progress to 30 minutes of  aerobic without signs/symptoms of physical distress  Continue with 30 min of aerobic exercise without signs/symptoms of physical distress.  Continue with 30 min of aerobic exercise without signs/symptoms of physical distress.  Continue with 30 min of aerobic exercise without signs/symptoms of physical distress.    Intensity  THRR New 107-121-139  THRR unchanged  THRR unchanged  THRR unchanged      Progression   Progression  -  Continue to progress workloads to maintain intensity without signs/symptoms of physical distress.  Continue to progress workloads to maintain intensity without signs/symptoms of physical distress.  Continue to progress workloads to maintain intensity without signs/symptoms of physical distress.    Average METs  -  1.8  2  2.06      Resistance Training   Training Prescription  -  Yes  Yes  Yes    Weight  -  1  2  2     Reps  -  10-15  10-15  10-15      Treadmill   MPH  -  1.1  1.4  1.6    Grade  -  0  0  0    Minutes  -  17  17  17     METs  -  1.84  2.07  2.22      NuStep   Level  -  - bothered R knee.   1  2    SPM  -  -  87  82    Minutes  -  -  17  17    METs  -  -  2  2      Arm Ergometer   Level  -  1  -  -    [place]atts  -  10  -  -    Minutes  -  22  -  -    METs  -  1.8  -  -      Home Exercise Plan   Plans to continue exercise at  Home (comment)  Home (comment)  Home (comment)  Home (comment)    Frequency  Add 3 additional days to program exercise sessions.  Add 3 additional days to program exercise sessions.  Add 3 additional days to program exercise sessions.  Add 3 additional days to program exercise sessions.    Initial Home Exercises Provided  03/17/18  03/17/18  03/17/18  03/17/18       Exercise Comments:  Exercise Comments    Row Name 04/04/18 1122 07/28/18 1308  08/02/18 1253 08/23/18 1240     Exercise Comments  Pt. is new to [facility]. She has attended 3 sessions so far. Her knee bothered her on the NuStep so we switched  her to the Arm Ergometer.   Pt. has returned since bein gout due to [place]. She has tolerated the exercise well.  [place] has attended 10 sessions so far. She is able to walk on the Treadmill with ease and has even requested to switch and do it second so that she can walk longer. We will continue to incresae her workloads as needed.  Pt. has now attended 14 sessions. She has increased to walking on the TM at 1.6 MPH and working on the NS at a level 2. She is eager to increase her strength and improve her ability to do things without becoming short of breath.       Exercise Goals and Review:  Exercise Goals    Row Name 03/17/18 1014             Exercise Goals   Increase Physical Activity  Yes       Intervention  Provide advice, education, support and counseling about physical activity/exercise needs.;Develop an individualized exercise prescription for aerobic and resistive training based on initial evaluation findings, risk stratification, comorbidities and participant's personal goals.       Expected Outcomes  Short Term: Attend rehab on a regular basis to increase amount of physical activity.;Long Term: Add in home exercise to make exercise part of routine and to increase amount of physical activity.;Long Term: Exercising regularly at least 3-5 days a week.       Increase Strength and Stamina  Yes       Intervention  Provide advice, education, support and counseling about physical activity/exercise needs.;Develop an individualized exercise prescription for aerobic and resistive training based on initial evaluation findings, risk stratification, comorbidities and participant's personal goals.       Expected Outcomes  Short Term: Increase workloads from initial exercise prescription for resistance, speed, and METs.       Able to understand and use rate of perceived exertion (RPE) scale  Yes       Intervention  Provide education and explanation on how to use RPE scale       Expected Outcomes   Short Term: Able to use RPE daily in rehab to express subjective intensity level;Long Term:  Able to use RPE to guide intensity level when exercising independently       Able to understand and use Dyspnea scale  Yes       Intervention  Provide education and explanation on how to use Dyspnea scale       Expected Outcomes  Short Term: Able to use Dyspnea scale daily in rehab to express subjective sense of shortness of breath during exertion;Long Term: Able to use Dyspnea scale to guide intensity level when exercising independently       Knowledge and understanding of Target Heart Rate Range (THRR)  Yes       Intervention  Provide education and explanation of THRR including how the numbers were predicted and where they are located for reference       Expected Outcomes  Short Term: Able to state/look up THRR       Able to check pulse independently  Yes       Intervention  Provide education and demonstration on how to check pulse in carotid and radial arteries.;Review the importance of being able to check your  own pulse for safety during independent exercise       Expected Outcomes  Short Term: Able to explain why pulse checking is important during independent exercise;Long Term: Able to check pulse independently and accurately       Understanding of Exercise Prescription  Yes       Intervention  Provide education, explanation, and written materials on patient's individual exercise prescription       Expected Outcomes  Short Term: Able to explain program exercise prescription;Long Term: Able to explain home exercise prescription to exercise independently          Exercise Goals Re-Evaluation : Exercise Goals Re-Evaluation    Row Name 04/04/18 1121 08/02/18 1251 08/23/18 1240         Exercise Goal Re-Evaluation   Exercise Goals Review  Increase Physical Activity;Able to understand and use rate of perceived exertion (RPE) scale;Knowledge and understanding of Target Heart Rate Range  (THRR);Understanding of Exercise Prescription;Increase Strength and Stamina;Able to understand and use Dyspnea scale;Able to check pulse independently  Increase Physical Activity;Increase Strength and Stamina;Able to understand and use rate of perceived exertion (RPE) scale;Able to understand and use Dyspnea scale;Knowledge and understanding of Target Heart Rate Range (THRR);Able to check pulse independently;Understanding of Exercise Prescription  Increase Physical Activity;Increase Strength and Stamina;Able to understand and use rate of perceived exertion (RPE) scale;Able to understand and use Dyspnea scale;Knowledge and understanding of Target Heart Rate Range (THRR);Able to check pulse independently;Understanding of Exercise Prescription     Comments  Pt. is new to the program. She has attended 3 sessions so far. We switched her off of the NuStep due to knee pain, but she is tolerating the TM and Arm Ergometer well.   Pt. is doing well in Pulmonary Rehab. She is somewhat weak but feels this helps her to improve her stength and walking abilities.  Pt. continues to be consistant in attending rehab. She has continued to follow her exercise prescription to aid her in reaching her goals.     Expected Outcomes  ambulate better and decrease SOB.   Short: increase overall activity level. Long: increased endurance.  Short: increase overall activity level. Long: increased endurance.        Discharge Exercise Prescription (Final Exercise Prescription Changes): Exercise Prescription Changes - 08/23/18 1200      Response to Exercise   Blood Pressure (Admit)  150/70    Blood Pressure (Exercise)  170/88    Blood Pressure (Exit)  160/80    Heart Rate (Admit)  84 bpm    Heart Rate (Exercise)  111 bpm    Heart Rate (Exit)  92 bpm    Oxygen Saturation (Admit)  94 %    Oxygen Saturation (Exercise)  96 %    Oxygen Saturation (Exit)  98 %    Rating of Perceived Exertion (Exercise)  13    Perceived Dyspnea  (Exercise)  14    Duration  Continue with 30 min of aerobic exercise without signs/symptoms of physical distress.    Intensity  THRR unchanged      Progression   Progression  Continue to progress workloads to maintain intensity without signs/symptoms of physical distress.    Average METs  2.06      Resistance Training   Training Prescription  Yes    Weight  2    Reps  10-15      Treadmill   MPH  1.6    Grade  0    Minutes  17  METs  2.22      NuStep   Level  2    SPM  82    Minutes  17    METs  2      Home Exercise Plan   Plans to continue exercise at  Home (comment)    Frequency  Add 3 additional days to program exercise sessions.    Initial Home Exercises Provided  03/17/18       Nutrition:  Target Goals: Understanding of nutrition guidelines, daily intake of sodium 1500mg , cholesterol 200mg , calories 30% from fat and 7% or less from saturated fats, daily to have 5 or more servings of fruits and vegetables.  Biometrics: Pre Biometrics - 03/17/18 1015      Pre Biometrics   Height  5\' 9"  (1.753 m)    Waist Circumference  42 inches    Hip Circumference  46 inches    Waist to Hip Ratio  0.91 %    Triceps Skinfold  24 mm    % Body Fat  41.6 %    Grip Strength  10 kg    Flexibility  0 in    Single Leg Stand  5.84 seconds        Nutrition Therapy Plan and Nutrition Goals: Nutrition Therapy & Goals - 08/24/18 1433      Personal Nutrition Goals   Additional Goals?  No    Comments  We have not resumed the RD meeting post COVID. We are working with our new dietician to resume this meeting safely. In the interim, we are providing handouts and doing education one-on-one as needed. Patient still feels like she is eating healthy. She says she is still not eating deserts and is sticking to her reduced sugar in her coffee.  Will continue to monitor      Intervention Plan   Intervention  Nutrition handout(s) given to patient.       Nutrition Assessments: Nutrition  Assessments - 03/17/18 1139      MEDFICTS Scores   Pre Score  77       Nutrition Goals Re-Evaluation:   Nutrition Goals Discharge (Final Nutrition Goals Re-Evaluation):   Psychosocial: Target Goals: Acknowledge presence or absence of significant depression and/or stress, maximize coping skills, provide positive support system. Participant is able to verbalize types and ability to use techniques and skills needed for reducing stress and depression.  Initial Review & Psychosocial Screening: Initial Psych Review & Screening - 03/17/18 1135      Initial Review   Current issues with  History of Depression      Family Dynamics   Good Support System?  Yes      Barriers   Psychosocial barriers to participate in program  The patient should benefit from training in stress management and relaxation.      Screening Interventions   Interventions  Encouraged to exercise    Expected Outcomes  Short Term goal: Identification and review with participant of any Quality of Life or Depression concerns found by scoring the questionnaire.;Long Term goal: The participant improves quality of Life and PHQ9 Scores as seen by post scores and/or verbalization of changes       Quality of Life Scores: Quality of Life - 03/17/18 1018      Quality of Life   Select  Quality of Life      Quality of Life Scores   Health/Function Pre  15.59 %    Socioeconomic Pre  21.29 %    Psych/Spiritual Pre  13.86 %    Family Pre  13.1 %    GLOBAL Pre  16.03 %      Scores of 19 and below usually indicate a poorer quality of life in these areas.  A difference of  2-3 points is a clinically meaningful difference.  A difference of 2-3 points in the total score of the Quality of Life Index has been associated with significant improvement in overall quality of life, self-image, physical symptoms, and general health in studies assessing change in quality of life.   PHQ-9: Recent Review Flowsheet Data    Depression  screen [facility] 2/9 03/17/2018   Decreased Interest 1   Down, Depressed, Hopeless 0   PHQ - 2 Score 1   Altered sleeping 3   Tired, decreased energy 1   Change in appetite 2   Feeling bad or failure about yourself  0   Trouble concentrating 0   Moving slowly or fidgety/restless 0   Suicidal thoughts 0   PHQ-9 Score 7   Difficult doing work/chores Somewhat difficult     Interpretation of Total Score  Total Score Depression Severity:  1-4 = Minimal depression, 5-9 = Mild depression, 10-14 = Moderate depression, 15-19 = Moderately severe depression, 20-27 = Severe depression   Psychosocial Evaluation and Intervention: Psychosocial Evaluation - 03/17/18 1137      Psychosocial Evaluation & Interventions   Interventions  Encouraged to exercise with the program and follow exercise prescription    Continue Psychosocial Services   Follow up required by staff       Psychosocial Re-Evaluation: Psychosocial Re-Evaluation    [place] Name 04/04/18 1456 08/02/18 1347 08/24/18 1435         Psychosocial Re-Evaluation   Current issues with  History of Depression  History of Depression  History of Depression     Comments  Patient's initial QOL score was 16.03 and her PHQ-9 score was 7. She is currently on Wellbutrin 200 mg BID for depression. Will continue to monitor.   Patient's initial QOL score was 16.03 and her PHQ-9 score was 7. She is currently on Wellbutrin 200 mg BID for depression. Will continue to monitor.   Patient's initial QOL score was 16.03 and her PHQ-9 score was 7. She is currently on Wellbutrin 200 mg BID for depression. Will continue to monitor.      Expected Outcomes  Patient's depression will continue to be managed and she will have no additional psychosocial issues identified at discharge.   Patient's depression will continue to be managed and she will have no additional psychosocial issues identified at discharge.   Patient's depression will continue to be managed and she will have no  additional psychosocial issues identified at discharge.      Interventions  Relaxation education;Encouraged to attend Pulmonary Rehabilitation for the exercise;Stress management education  Relaxation education;Encouraged to attend Pulmonary Rehabilitation for the exercise;Stress management education  Relaxation education;Encouraged to attend Pulmonary Rehabilitation for the exercise;Stress management education     Continue Psychosocial Services   Follow up required by staff  Follow up required by staff  Follow up required by staff        Psychosocial Discharge (Final Psychosocial Re-Evaluation): Psychosocial Re-Evaluation - 08/24/18 1435      Psychosocial Re-Evaluation   Current issues with  History of Depression    Comments  Patient's initial QOL score was 16.03 and her PHQ-9 score was 7. She is currently on Wellbutrin 200 mg BID for depression. Will continue to monitor.  Expected Outcomes  Patient's depression will continue to be managed and she will have no additional psychosocial issues identified at discharge.     Interventions  Relaxation education;Encouraged to attend Pulmonary Rehabilitation for the exercise;Stress management education    Continue Psychosocial Services   Follow up required by staff        Education: Education Goals: Education classes will be provided on a weekly basis, covering required topics. Participant will state understanding/return demonstration of topics presented.  Learning Barriers/Preferences: Learning Barriers/Preferences - 03/17/18 1139      Learning Barriers/Preferences   Learning Barriers  None       Education Topics: How Lungs Work and Diseases: - Discuss the anatomy of the lungs and diseases that can affect the lungs, such as COPD.   PULMONARY REHAB CHRONIC OBSTRUCTIVE PULMONARY DISEASE from 08/11/2018 in [place]  Date  07/14/18  Educator  DJ  Instruction Review Code  2- Demonstrated Understanding       Exercise: -Discuss the importance of exercise, FITT principles of exercise, normal and abnormal responses to exercise, and how to exercise safely.   Environmental Irritants: -Discuss types of environmental irritants and how to limit exposure to environmental irritants.   PULMONARY REHAB CHRONIC OBSTRUCTIVE PULMONARY DISEASE from 08/11/2018 in [place]  Date  07/21/18  Educator  D. Coad  Instruction Review Code  2- Demonstrated Understanding      Meds/Inhalers and oxygen: - Discuss respiratory medications, definition of an inhaler and oxygen, and the proper way to use an inhaler and oxygen.   PULMONARY REHAB CHRONIC OBSTRUCTIVE PULMONARY DISEASE from 08/11/2018 in [place]  Date  07/28/18  Educator  DC      Energy Saving Techniques: - Discuss methods to conserve energy and decrease shortness of breath when performing activities of daily living.    PULMONARY REHAB CHRONIC OBSTRUCTIVE PULMONARY DISEASE from 08/11/2018 in [place]  Date  08/04/18  Educator  DJ  Instruction Review Code  2- Demonstrated Understanding      Bronchial Hygiene / Breathing Techniques: - Discuss breathing mechanics, pursed-lip breathing technique,  proper posture, effective ways to clear airways, and other functional breathing techniques   PULMONARY REHAB CHRONIC OBSTRUCTIVE PULMONARY DISEASE from 08/11/2018 in [place]  Date  08/11/18  Educator  DC  Instruction Review Code  2- Demonstrated Understanding      Cleaning Equipment: - Provides group verbal and written instruction about the health risks of elevated stress, cause of high stress, and healthy ways to reduce stress.   PULMONARY REHAB CHRONIC OBSTRUCTIVE PULMONARY DISEASE from 08/11/2018 in [place]  Date  03/24/18  Educator  Wynetta Emery  Instruction Review Code  2- Demonstrated Understanding      Nutrition I: Fats: -  Discuss the types of cholesterol, what cholesterol does to the body, and how cholesterol levels can be controlled.   Nutrition II: Labels: -Discuss the different components of food labels and how to read food labels.   Respiratory Infections: - Discuss the signs and symptoms of respiratory infections, ways to prevent respiratory infections, and the importance of seeking medical treatment when having a respiratory infection.   Stress I: Signs and Symptoms: - Discuss the causes of stress, how stress may lead to anxiety and depression, and ways to limit stress.   Stress II: Relaxation: -Discuss relaxation techniques to limit stress.   Oxygen for Home/Travel: - Discuss how to prepare for travel when on oxygen and proper  ways to transport and store oxygen to ensure safety.   Knowledge Questionnaire Score: Knowledge Questionnaire Score - 03/17/18 1013      Knowledge Questionnaire Score   Pre Score  16/18       Core Components/Risk Factors/Patient Goals at Admission: Personal Goals and Risk Factors at Admission - 03/17/18 1141      Core Components/Risk Factors/Patient Goals on Admission    Weight Management  Yes    Intervention  Weight Management: Develop a combined nutrition and exercise program designed to reach desired caloric intake, while maintaining appropriate intake of nutrient and fiber, sodium and fats, and appropriate energy expenditure required for the weight goal.    Admit Weight  206 lb 8 oz (93.7 kg)    Goal Weight: Short Term  196 lb 8 oz (89.1 kg)    Goal Weight: Long Term  186 lb 8 oz (84.6 kg)    Personal Goal Other  Yes    Personal Goal  Lose 20lbs, ambulate better w/SOB, increase endurance     Intervention  Attend PR 2 x week, supplement with 3 x week at home exercise.     Expected Outcomes  Reach personal goal.        Core Components/Risk Factors/Patient Goals Review:  Goals and Risk Factor Review    Row Name 04/04/18 1453 08/02/18 1343 08/24/18 1435          Core Components/Risk Factors/Patient Goals Review   Personal Goals Review  Weight Management/Obesity;Improve shortness of breath with ADL's Lose 20 lbs; ambulate better w/o SOB; increase endurance.   Weight Management/Obesity;Improve shortness of breath with ADL's Lose 20 lbs; ambulate better w/o SOB; increase endruance.  Weight Management/Obesity;Improve shortness of breath with ADL's Lose 20 lbs, ambulate better w/o SOB; increase endurance.     Review  Patient has completed 3 sessions maintaining her weight since she started the program. She is doing well in the program. She says she has only had 2 sessions and has not noticed much progress yet. Outpatient pulmonary services was suspended 03/28/18 due to COVID-19 restrictions. Patient says she plans to walk to her mailbox everyday which is on an incline to remain active. Will continue to monitor.   Pulmonary Rehab closed 03/28/18 and reopened 07/11/18. Patient returned to the program losing 4 lbs during the closure. She is doing well in the program with progression. She says does feel stronger. She says her rigth knee bothers her at times decreasing her activity at home. She says she had a recent CT scan showing 2 new spots on her lungs. She says they are going to monitor this and do a repeat CT scan in 3 months. She is concerned that her lung cancer had returned. Will continue to monitor for progress.  Patient has completed 14 sessions gaining 2 lbs since her last 30 day review. She continues to do well in the program with progression. She plans on moving to [place] soon. She has already sold her house locally. We plan to graduate her in the next 5 sessions. She says she feels somewhat stronger since she started but continues to struggle with right knee pain. Her b/p has been elevated recently as well. Will continue to monitor for progress.     Expected Outcomes  Patient will continue to attend sessions and complete the program meeting her personal  goald.   Patient will continue to attend sessions and complete the program meeting her personal goald.   Patient will continue to attend sessions and  complete the program meeting her personal goals.        Core Components/Risk Factors/Patient Goals at Discharge (Final Review):  Goals and Risk Factor Review - 08/24/18 1435      Core Components/Risk Factors/Patient Goals Review   Personal Goals Review  Weight Management/Obesity;Improve shortness of breath with ADL's   Lose 20 lbs, ambulate better w/o SOB; increase endurance.   Review  Patient has completed 14 sessions gaining 2 lbs since her last 30 day review. She continues to do well in the program with progression. She plans on moving to [place] soon. She has already sold her house locally. We plan to graduate her in the next 5 sessions. She says she feels somewhat stronger since she started but continues to struggle with right knee pain. Her b/p has been elevated recently as well. Will continue to monitor for progress.    Expected Outcomes  Patient will continue to attend sessions and complete the program meeting her personal goals.       ITP Comments: ITP Comments    Row Name 04/04/18 1449           ITP Comments  Pulmonary rehab services was suspended 03/28/18 due to COVID-19 restrictions and will resume when restrictions are lifted.           Comments: ITP REVIEW Pt is making expected progress toward pulmonary rehab goals after completing 14 sessions. Recommend continued exercise, life style modification, education, and utilization of breathing techniques to increase stamina and strength and decrease shortness of breath with exertion.

## 2018-08-24 NOTE — Progress Notes (Signed)
Office Visit Note   Patient: Ellen Bowers           Date of Birth: Sep 13, 1945           MRN: [id number] Visit Date: 08/24/2018              Requested by: [name], MD [address] [place],  [place] [address] PCP: [name], MD   Assessment & Plan: Visit Diagnoses:  1. Trochanteric bursitis, right hip     Plan: Recurrent pain over the greater trochanter right hip will inject with cortisone.  [name] is moving back to [place] within the next month  Follow-Up Instructions: Return if symptoms worsen or fail to improve.   Orders:  Orders Placed This Encounter  Procedures  . Large Joint Inj: R greater trochanter   No orders of the defined types were placed in this encounter.     Procedures: Large Joint Inj: R greater trochanter on 08/24/2018 1:05 PM Indications: pain and diagnostic evaluation Details: 25 G 1.5 in needle  Arthrogram: No  Medications: 2 mL lidocaine 1 %; 2 mL bupivacaine 0.5 %; 80 mg methylPREDNISolone acetate 40 MG/ML Procedure, treatment alternatives, risks and benefits explained, specific risks discussed. Consent was given by the patient. Immediately prior to procedure a time out was called to verify the correct patient, procedure, equipment, support staff and site/side marked as required. Patient was prepped and draped in the usual sterile fashion.       Clinical Data: No additional findings.   Subjective: Chief Complaint  Patient presents with  . Right Hip - Pain  . Right Knee - Pain  Patient presents today for a one week follow up. She received a right knee cortisone injection last week on 08/17/2018. She is wanting to get her right hip injected today.  Has prior diagnosis of greater trochanteric bursitis.  She is locally tender directly over the tip of the greater trochanter  HPI  Review of Systems   Objective: Vital Signs: There were no vitals taken for this visit.  Physical Exam Constitutional:      Appearance: She  is well-developed.  Eyes:     Pupils: Pupils are equal, round, and reactive to light.  Pulmonary:     Effort: Pulmonary effort is normal.  Skin:    General: Skin is warm and dry.  Neurological:     Mental Status: She is alert and oriented to person, place, and time.  Psychiatric:        Behavior: Behavior normal.     Ortho Exam awake alert and oriented x3 comfortable sitting.  Has local tenderness over the tip of the greater trochanter of her right hip.  No groin pain.  No loss of motion of either hip  Specialty Comments:  No specialty comments available.  Imaging: No results found.   PMFS History: Patient Active Problem List   Diagnosis Date Noted  . Trochanteric bursitis, right hip 08/24/2018  . Low back pain 08/17/2018  . Lung nodules 08/04/2018  . Constipation 05/17/2018  . Chronic pain of right knee 12/29/2017  . 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 Unilateral primary osteoarthritis, right knee 08/18/2017  . Lung cancer, lower lobe ([place]) 02/08/2017  . Dyspepsia   . Anorectal fissure 10/24/2014  . History of colon polyps 10/24/2014  . COPD with chronic bronchitis and emphysema ([place]) 06/07/2014  . Hemorrhoids 05/07/2014  . GERD (gastroesophageal reflux disease) 09/06/2013   Past Medical History:  Diagnosis Date  .  Anxiety   . Arthritis    R knee, bilateral hips, hands & neck- being given steroid injections   . Asthma   . Blood dyscrasia   . Cancer (HCC)    L hand  . COPD (chronic obstructive pulmonary disease) ([place])   . Depression   . Dyspnea   . GERD (gastroesophageal reflux disease)   . Helicobacter pylori gastritis 2005  . Hyperlipidemia   . 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 Von Willebrand disease ([place]) 1996    Family History  Problem Relation Age of Onset  . Colon cancer Mother        Less than age 19  . Colon cancer Father        More than age 42, died from metastatic disease at age 65  . Liver disease Neg Hx     Past  Surgical History:  Procedure Laterality Date  . BIOPSY  12/08/2016   Procedure: BIOPSY;  Surgeon: Danie Binder, MD;  Location: [facility];  Service: Endoscopy;;  . BRAVO [place] STUDY N/A 12/08/2016   Procedure: BRAVO [place]; Bravo Capsule Placement;  Surgeon: Danie Binder, MD;  Location: [facility];  Service: Endoscopy;  Laterality: N/A;  . BREAST LUMPECTOMY Left    benign  . CATARACT EXTRACTION     double  . COLONOSCOPY N/A 05/30/2012   Dr.Mann:Multiple polyps removed from the right colon-see description above; otherwise normal colonoscopy up to the cecum. 5 polyps, multiple tubular adenomas. next tcs 05/2015  . COLONOSCOPY N/A 12/13/2015   Procedure: COLONOSCOPY;  Surgeon: Danie Binder, MD;  Location: [facility];  Service: Endoscopy;  Laterality: N/A;  8:30 am  . ESOPHAGOGASTRODUODENOSCOPY  2012   Dr. Collene Mares: few erosions in duodenal bulb. BRAVO off PPI ("severe reflux")  . ESOPHAGOGASTRODUODENOSCOPY N/A 06/12/2014   SLF: 1. The mucosa of the esophagus appeared normal 2. Mild non-erosive gastrtitis.   [name] ESOPHAGOGASTRODUODENOSCOPY (EGD) WITH PROPOFOL N/A 12/08/2016   Procedure: ESOPHAGOGASTRODUODENOSCOPY (EGD) WITH PROPOFOL;  Surgeon: Danie Binder, MD;  Location: [facility];  Service: Endoscopy;  Laterality: N/A;  7:30am  . EYE SURGERY Bilateral    L& R  . FLEXIBLE SIGMOIDOSCOPY N/A 02/05/2014   Dr. Oneida Alar: 5, 2-3 mm sessile sigmoid colon polyps removed, small internal hemorrhoids, moderate external hemorrhoids, 2 anal fissures present. Polyps were hyperplastic.   [name] NECK SURGERY  1951   cyst removal  . NECK SURGERY  1998   growth removed  . THORACOTOMY Right 02/08/2017   Procedure: THORACOTOMY MAJOR;  Surgeon: Gaye Pollack, MD;  [facility];  Service: Thoracic;  Laterality: Right;  . TONSILLECTOMY  1951  . TUBAL LIGATION  1984  . VIDEO ASSISTED THORACOSCOPY (VATS)/WEDGE RESECTION Right 02/08/2017   Procedure: VIDEO ASSISTED THORACOSCOPY (VATS)/WEDGE RESECTION;   Surgeon: Gaye Pollack, MD;  Location: [facility] OR;  Service: Thoracic;  Laterality: Right;   Social History   Occupational History  . Not on file  Tobacco Use  . Smoking status: Former Smoker    Packs/day: 1.00    Years: 50.00    Pack years: 50.00    Types: Cigarettes    Start date: 03/17/1961    Quit date: 05/30/2009    Years since quitting: 9.2  . Smokeless tobacco: Never Used  . Tobacco comment: Quit smoking x 7 years  Substance and Sexual Activity  . Alcohol use: Not Currently    Comment: Occasionally    . Drug  use: No  . Sexual activity: Not on file

## 2018-08-25 ENCOUNTER — Encounter (HOSPITAL_COMMUNITY)
Admission: RE | Admit: 2018-08-25 | Discharge: 2018-08-25 | Disposition: A | Discharge status: Home / Self Care | Payer: Medicare Other | Source: Ambulatory Visit | Attending: Pulmonary Disease | Admitting: Pulmonary Disease

## 2018-08-25 DIAGNOSIS — J449 Chronic obstructive pulmonary disease, unspecified: Secondary | ICD-10-CM

## 2018-08-25 NOTE — Progress Notes (Signed)
Daily Session Note  Patient Details  Name: Ellen Bowers MRN: [id number] Date of Birth: 05/24/1945 Referring Provider:     PULMONARY REHAB COPD ORIENTATION from 03/17/2018 in [place] [facility]  Referring Provider  Ellen Bowers      Encounter Date: 08/25/2018  Check In: Session Check In - 08/25/18 1045      Check-In   Supervising physician immediately available to respond to emergencies  See telemetry face sheet for immediately available MD    Location  [facility]    Staff Present  Ellen Coad, MS, EP, CHC, Exercise Physiologist;Ellen Bowers, Exercise Physiologist    Virtual Visit  No    Medication changes reported      No    Fall or balance concerns reported     No    Tobacco Cessation  No Change    Warm-up and Cool-down  Performed as group-led instruction    Resistance Training Performed  Yes    VAD Patient?  No    PAD/SET Patient?  No      Pain Assessment   Currently in Pain?  No/denies    Multiple Pain Sites  No       Capillary Blood Glucose: No results found for this or any previous visit (from the past 24 hour(s)).    Social History   Tobacco Use  Smoking Status Former Smoker  . Packs/day: 1.00  . Years: 50.00  . Pack years: 50.00  . Types: Cigarettes  . Start date: 03/17/1961  . Quit date: 05/30/2009  . Years since quitting: 9.2  Smokeless Tobacco Never Used  Tobacco Comment   Quit smoking x 7 years    Goals Met:  Proper associated with RPD/PD & O2 Sat Independence with exercise equipment Using PLB without cueing & demonstrates good technique Exercise tolerated well No report of cardiac concerns or symptoms Strength training completed today  Goals Unmet:  Not Applicable  Comments: Pt able to follow exercise prescription today without complaint.  Will continue to monitor for progression. Check out 1145.   Dr. Edward Bowers is Medical Director for [facility]. 

## 2018-08-30 ENCOUNTER — Encounter (HOSPITAL_COMMUNITY)
Admission: RE | Admit: 2018-08-30 | Discharge: 2018-08-30 | Disposition: A | Discharge status: Home / Self Care | Payer: Medicare Other | Source: Ambulatory Visit | Attending: Pulmonary Disease | Admitting: Pulmonary Disease

## 2018-08-30 ENCOUNTER — Other Ambulatory Visit: Payer: Self-pay

## 2018-08-30 DIAGNOSIS — J449 Chronic obstructive pulmonary disease, unspecified: Secondary | ICD-10-CM | POA: Diagnosis not present

## 2018-08-30 NOTE — Progress Notes (Signed)
Daily Session Note  Patient Details  Name: Ellen Bowers MRN: [id number] Date of Birth: [birth year] Referring Provider:     PULMONARY REHAB COPD ORIENTATION from 03/17/2018 in [place]  Referring Provider  Loanne Drilling      Encounter Date: 08/30/2018  Check In: Session Check In - 08/30/18 1045      Check-In   Supervising physician immediately available to respond to emergencies  See telemetry face sheet for immediately available ER MD    Location  [facility]    Staff Present  Russella Dar, MS, EP, [facility], Exercise Physiologist;Walid Haig, Exercise Physiologist;Debra Wynetta Emery, RN, BSN    Virtual Visit  No    Medication changes reported      No    Fall or balance concerns reported     No    Tobacco Cessation  No Change    Warm-up and Cool-down  Performed as group-led instruction    Resistance Training Performed  Yes    VAD Patient?  No    PAD/SET Patient?  No      Pain Assessment   Currently in Pain?  No/denies    Multiple Pain Sites  No       Capillary Blood Glucose: No results found for this or any previous visit (from the past 24 hour(s)).    Social History   Tobacco Use  Smoking Status Former Smoker  . Packs/day: 1.00  . Years: 50.00  . Pack years: 50.00  . Types: Cigarettes  . Start date: 03/17/1961  . Quit date: 05/30/2009  . Years since quitting: 9.2  Smokeless Tobacco Never Used  Tobacco Comment   Quit smoking x 7 years    Goals Met:  Proper associated with RPD/PD & O2 Sat Independence with exercise equipment Improved SOB with ADL's Using PLB without cueing & demonstrates good technique Exercise tolerated well No report of cardiac concerns or symptoms Strength training completed today  Goals Unmet:  Not Applicable  Comments: Pt able to follow exercise prescription today without complaint.  Will continue to monitor for progression. Check out 1145.   Dr. Sinda Du is Medical Director for [name]  Pulmonary Rehab.

## 2018-09-01 ENCOUNTER — Encounter (HOSPITAL_COMMUNITY)
Admission: RE | Admit: 2018-09-01 | Discharge: 2018-09-01 | Disposition: A | Discharge status: Home / Self Care | Payer: Medicare Other | Source: Ambulatory Visit | Attending: Pulmonary Disease | Admitting: Pulmonary Disease

## 2018-09-01 ENCOUNTER — Other Ambulatory Visit: Payer: Self-pay

## 2018-09-01 VITALS — Ht 69.0 in | Wt 206.4 lb

## 2018-09-01 DIAGNOSIS — J449 Chronic obstructive pulmonary disease, unspecified: Secondary | ICD-10-CM | POA: Diagnosis not present

## 2018-09-01 NOTE — Progress Notes (Signed)
Daily Session Note  Patient Details  Name: TYTIONNA CLOYD MRN: [id number] Date of Birth: 1945/02/02 Referring Provider:     PULMONARY REHAB COPD ORIENTATION from 03/17/2018 in [place]  Referring Provider  Loanne Drilling      Encounter Date: 09/01/2018  Check In: Session Check In - 09/01/18 1045      Check-In   Supervising physician immediately available to respond to emergencies  See telemetry face sheet for immediately available MD    Location  [facility]    Staff Present  Russella Dar, MS, EP, [facility], Exercise Physiologist;Samnang Shugars Wynetta Emery, RN, BSN    Virtual Visit  No    Medication changes reported      No    Fall or balance concerns reported     No    Tobacco Cessation  No Change    Warm-up and Cool-down  Performed as group-led instruction    Resistance Training Performed  Yes    VAD Patient?  No    PAD/SET Patient?  No      Pain Assessment   Currently in Pain?  No/denies    Multiple Pain Sites  No       Capillary Blood Glucose: No results found for this or any previous visit (from the past 24 hour(s)).    Social History   Tobacco Use  Smoking Status Former Smoker  . Packs/day: 1.00  . Years: 50.00  . Pack years: 50.00  . Types: Cigarettes  . Start date: 03/17/1961  . Quit date: 05/30/2009  . Years since quitting: 9.2  Smokeless Tobacco Never Used  Tobacco Comment   Quit smoking x 7 years    Goals Met:  Proper associated with RPD/PD & O2 Sat Independence with exercise equipment Improved SOB with ADL's Using PLB without cueing & demonstrates good technique Exercise tolerated well No report of cardiac concerns or symptoms Strength training completed today  Goals Unmet:  Not Applicable  Comments: Pt able to follow exercise prescription today without complaint.  Will continue to monitor for progression. Check out 1145.   Dr. Sinda Du is Medical Director for [facility] Pulmonary Rehab.

## 2018-09-02 ENCOUNTER — Telehealth: Payer: Self-pay | Admitting: Pulmonary Disease

## 2018-09-02 NOTE — Telephone Encounter (Signed)
Thanks. JE

## 2018-09-02 NOTE — Telephone Encounter (Signed)
Patient states she was seen by Dr. Loanne Drilling on 08/03/18 and there was a change in her antibiotic from every day to Every other day due to patient doing so well.   Patient states she only did every other day for a week then went back to every day. Patient's dry cough is back and every day. She states she is back to taking the hycodan at night. She also will take her antibiotic every day.   Patient doesn't want a NP to RX anything for her over the weekend, NOR did she want a televisit or in office visit with an NP today 09/02/18. patient wants to see Dr. Loanne Drilling on Monday. Patient states has enough medication to get her to her appointment on Monday.  Patient answered NO to all other covid questions. She states she has taken ibuprofen yesterday. (600mg ).  I instructed patient if symptoms worsen or more symptoms to call our office Monday morning before coming in.    I will route this to Dr. Loanne Drilling as Juluis Rainier.  Pt scheduled with JE on Monday 09/05/18 at1045

## 2018-09-05 ENCOUNTER — Ambulatory Visit (INDEPENDENT_AMBULATORY_CARE_PROVIDER_SITE_OTHER): Payer: Medicare Other | Admitting: Pulmonary Disease

## 2018-09-05 ENCOUNTER — Other Ambulatory Visit: Payer: Self-pay

## 2018-09-05 ENCOUNTER — Encounter: Payer: Self-pay | Admitting: Pulmonary Disease

## 2018-09-05 VITALS — BP 142/70 | HR 72 | Temp 98.2°F | Ht 69.0 in | Wt 200.4 lb

## 2018-09-05 DIAGNOSIS — J4489 Other specified chronic obstructive pulmonary disease: Secondary | ICD-10-CM

## 2018-09-05 DIAGNOSIS — R918 Other nonspecific abnormal finding of lung field: Secondary | ICD-10-CM

## 2018-09-05 DIAGNOSIS — J441 Chronic obstructive pulmonary disease with (acute) exacerbation: Secondary | ICD-10-CM

## 2018-09-05 DIAGNOSIS — J449 Chronic obstructive pulmonary disease, unspecified: Secondary | ICD-10-CM

## 2018-09-05 MED ORDER — AZITHROMYCIN 250 MG PO TABS: 250.0000 mg | ORAL_TABLET | Freq: Every day | ORAL | 6 refills | Status: DC

## 2018-09-05 MED ORDER — PREDNISONE 10 MG PO TABS: 40.0000 mg | ORAL_TABLET | Freq: Every day | ORAL | 0 refills | Status: AC

## 2018-09-05 MED ORDER — HYDROCODONE-HOMATROPINE 5-1.5 MG/5ML PO SYRP: ORAL_SOLUTION | ORAL | 0 refills | Status: AC

## 2018-09-05 NOTE — Patient Instructions (Addendum)
COPD with chronic bronchitis and emphysema --Continue daily azithromycin 250 mg daily --Continue Advair 500 mg twice a day. With eosinophilia, patient continues to benefit from ICS therapy. --Continue scheduled Duonebs three times a day --Continue Pulmicort as needed for wheezing --START Prednisone 40 mg for five days  --REFILLED Hycodan syrup for cough  Recommend obtaining imaging discs for the following dates: --CT Chest without contrast 07/27/18 --CT Chest without contrast 04/13/18 --CT Chest without contrast 09/22/17  Multiple lung nodules --Next CT Chest is due in 10/2018   Please let us know how we can help until your big move. Good luck!   Rodman Pickle, MD

## 2018-09-05 NOTE — Progress Notes (Signed)
Synopsis: Referred in 01/2018 for   Subjective:   PATIENT ID: Ellen Bowers GENDER: female DOB: 11-23-1945, MRN: [id number]   HPI  Chief Complaint  Patient presents with  . Follow-up    cough, stopped pulmonary rehab due to move   Ms. 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  Interval Since our last visit, she tried to decrease macrolide use to every day as instructed however her non-productive cough returned and required her to started daily azithromycin and hycodan nightly. Her cough is improved after restarting daily azithro but still needing hycodan for a few days a week. She has participated in Pulmonary rehab and doing well. However, she is planning to move so has recently stopped her Pulmonary rehab sessions. Has been compliant with her high-dose Advair. She has decreased her Duonebs to BID. She uses albuterol once every other day.   Social History: Stop smoking 8 years ago.  50 pack years. Father had TB when 12 years ago Previously worked as a Doctor, general practice exposures: None   I have personally reviewed patient's past medical/family/social history/allergies/current medications.  Past Medical History:  Diagnosis Date  . Anxiety   . Arthritis    R knee, bilateral hips, hands & neck- being given steroid injections   . Asthma   . Blood dyscrasia   . Cancer (HCC)    L hand  . COPD (chronic obstructive pulmonary disease) ([place])   . Depression   . Dyspnea   . GERD (gastroesophageal reflux disease)   . Helicobacter pylori gastritis 2005  . Hyperlipidemia   . 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 Von Willebrand disease ([place]) 1996    Outpatient Medications Prior to Visit  Medication Sig Dispense Refill  . acetaminophen (TYLENOL) 500 MG tablet Take 1,000 mg by mouth 2 (two) times daily as needed for moderate  pain or headache.    . albuterol (PROVENTIL HFA;VENTOLIN HFA) 108 (90 Base) MCG/ACT inhaler Inhale 2 puffs into the lungs every 6 (six) hours as needed for wheezing or shortness of breath.    [name] alum & mag hydroxide-simeth (MAALOX/MYLANTA) 200-200-20 MG/5ML suspension Take 15 mLs by mouth every 6 (six) hours as needed for indigestion or heartburn.     [name] amLODipine (NORVASC) 2.5 MG tablet Take 2.5 mg by mouth daily.    . Ascorbic Acid (VITAMIN C) 1000 MG tablet Take 1,000 mg by mouth daily.     [name] azelastine (ASTELIN) 0.1 % nasal spray Place 2 sprays into both nostrils daily as needed for rhinitis. Use in each nostril as directed    . b complex vitamins tablet Take 1 tablet by mouth daily.    . budesonide (PULMICORT) 0.5 MG/2ML nebulizer solution Take 0.5 mg by nebulization as needed (for shortness of breath/wheezing.).     [name] buPROPion (WELLBUTRIN SR) 200 MG 12 hr tablet Take 200 mg by mouth 2 (two) times daily.    . Calcium-Magnesium (CAL-MAG PO) Take 1 tablet by mouth at bedtime.    . carboxymethylcellulose (REFRESH PLUS) 0.5 % SOLN Place 1-2 drops into both eyes 2 (two) times daily as needed (for dry eyes). REFRESH    . celecoxib (CELEBREX) 200 MG capsule Take 200 mg by mouth 2 (two) times daily.    . cetirizine (ZYRTEC) 10 MG tablet Take 10 mg by mouth daily.    [name]  Cholecalciferol (VITAMIN D PO) Take by mouth daily. Unsure of dose    . diclofenac (FLECTOR) 1.3 % PTCH Place 0.25 patches onto the skin as needed. 1/4TH OF PATCH DAILY     . diclofenac sodium (VOLTAREN) 1 % GEL Apply 4 g topically 4 (four) times daily. (Patient taking differently: Apply 4 g topically as needed. ) 3 Tube 4  . docusate sodium (COLACE) 100 MG capsule Take 100 mg by mouth 2 (two) times daily.     [name] doxylamine, Sleep, (UNISOM) 25 MG tablet Take 12.5 mg by mouth at bedtime.     . fluticasone (FLONASE) 50 MCG/ACT nasal spray Place 2 sprays into both nostrils daily as needed for allergies or rhinitis.    .  Fluticasone-Salmeterol (ADVAIR) 500-50 MCG/DOSE AEPB Inhale 1 puff into the lungs 2 (two) times daily.     [name] ipratropium-albuterol (DUONEB) 0.5-2.5 (3) MG/3ML SOLN Take 3 mLs by nebulization 3 (three) times daily. (Patient taking differently: Take 3 mLs by nebulization 3 (three) times daily. Scheduled twice daily, may use additional dose as needed) 360 mL 11  . Misc Natural Products (MIDNITE PO) Take 1 tablet by mouth at bedtime.    . montelukast (SINGULAIR) 10 MG tablet Take 10 mg by mouth daily.     . ondansetron (ZOFRAN) 4 MG tablet Take 1 tablet (4 mg total) by mouth every 8 (eight) hours as needed for nausea or vomiting. 20 tablet 0  . pantoprazole (PROTONIX) 40 MG tablet TAKE ONE TABLET BY MOUTH TWICE DAILY - 30 MINUTES BEFORE BREAKFAST & DINNER 180 tablet 3  . pravastatin (PRAVACHOL) 20 MG tablet Take 20 mg by mouth at bedtime.     . Probiotic Product (PROBIOTIC DAILY PO) Take by mouth daily.    . solifenacin (VESICARE) 5 MG tablet Take 5 mg by mouth at bedtime.     [name] azithromycin (ZITHROMAX) 250 MG tablet Take one tablet every other day. (Patient taking differently: 250 mg daily. Take one tablet every other day.) 90 tablet 1  . HYDROcodone-homatropine (HYCODAN) 5-1.5 MG/5ML syrup TAKE 1 TEASPOONFUL (5ML) BY MOUTH EVERY 6 HOURS AS NEEDED 473 mL 0   No facility-administered medications prior to visit.     Review of Systems  Constitutional: Negative for chills, diaphoresis, fever, malaise/fatigue and weight loss.  HENT: Negative for congestion, ear pain and sore throat.   Respiratory: Positive for cough. Negative for hemoptysis, sputum production, shortness of breath and wheezing.   Cardiovascular: Negative for chest pain, palpitations and leg swelling.  Gastrointestinal: Negative for abdominal pain, heartburn and nausea.  Genitourinary: Negative for frequency.  Musculoskeletal: Negative for joint pain and myalgias.  Skin: Negative for itching and rash.  Neurological: Negative for  dizziness, weakness and headaches.  Endo/Heme/Allergies: Does not bruise/bleed easily.  Psychiatric/Behavioral: Negative for depression. The patient is not nervous/anxious.      Objective:   Vitals:   09/05/18 1057  BP: (!) 142/70  Pulse: 72  Temp: 98.2 F (36.8 C)  TempSrc: Oral  SpO2: 98%  Weight: 200 lb 6.4 oz (90.9 kg)  Height: 5\' 9"  (1.753 m)   SpO2: 98 % O2 Device: None (Room air)  Physical Exam: General: Well-appearing, no acute distress HENT: [place], AT Eyes: EOMI, no scleral icterus Respiratory: Clear to auscultation bilaterally.  No crackles, wheezing or rales Cardiovascular: RRR, -M/R/G, no JVD GI: BS+, soft, nontender Extremities:-Edema,-tenderness Neuro: AAO x4, CNII-XII grossly intact Skin: Intact, no rashes or bruising Psych: Normal mood, normal affect  Data reviewed  Chest imaging:  CT 09/22/17 - Emphysema. S/p RLL wedge resection.   CT chest 04/13/2018-right lower lobe nodular consolidation adjacent to the wedge resection line that may represent scarring however cannot rule out tumor recurrence.  Interval development of solid left upper lobe nodule 5 mm.  Right upper lobe lung nodule measured at 1.0 cm stable.  No mediastinal adenopathy noted.  Background emphysema  CT chest 07/27/2018-status post right lower lobe wedge resection.  Interval development of spiculated opacities in the left upper lobes measuring 2.0 x 0.9 cm and 1.0 cm and sub-solid opacity in right upper lobe measuring 5 mm compared to 04/13/2018 imaging.  Left upper lobe nodule stable at 5 mm.  Right upper lobe lung nodule measured at 1.0 cm stable.  Background emphysema  PFT: 01/26/17 FVC 2.86 (78 %) FEV1 1.97 (71 %) Ratio 71 Normal lung volumes including TLC. Moderately reduced DLCO. Interpretation: Mild obstructive defect with reduced DLCO suggestive of emphysema  01/31/18 FVC 2.56 (70 %) FEV1 1.68 (61 %) Ratio 65 TLC 75 % DLCO 44 % Interpretation: Mixed moderate obstructive and restrictive  defect with moderately reduced DLCO  Labs CBC and CMP reviewed from 08/03/2018-WBC and hemoglobin 8.4 and 12.5, respectively.  Relative eosinophils 3.2% with absolute eosinophils 300  Imaging, labs and test noted above have been reviewed independently by me.    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  She has a chronic cough that improved with daily macrolide therapy.  She continues to have cough and shortness of breath that is consistent with exacerbation.  Will treat with prednisone course.  For her lung nodules, she is followed by Dr. Cyndia Bent in [place].  Her right lower lobe opacity likely represents scarring.  I agree with continued surveillance of her new lung nodules/opacities. If these lesions persist or enlarge, I would be concerned about recurrent malignancy however diagnostic testing will be difficult as patient will be high risk for complications with her emphysema.  After reviewing her CT, I believe a navigational bronchoscopy would be risky in terms of potential complications and be of very low diagnostic yield due to the lack of available airways in the background of severe apical emphysema.  Been told by Dr. Cyndia Bent that no further surgery is advised.  Further surveillance imaging in the future would be purely for monitoring nodule stability.  Patient plans to move next month and will be establishing care with a new pulmonologist in [place].  COPD with chronic bronchitis and emphysema COPD exacerbation --Continue daily azithromycin 250 mg daily --Continue Advair 500 mg twice a day. With eosinophilia, patient continues to benefit from ICS therapy. --Continue scheduled Duonebs three times a day --Continue Pulmicort as needed for  wheezing --START Prednisone 40 mg for five days  --REFILLED Hycodan syrup for cough  Recommend obtaining imaging discs for the following dates: --CT Chest without contrast 07/27/18 --CT Chest without contrast 04/13/18 --CT Chest without contrast 09/22/17  Multiple lung nodules --Next CT Chest is due in 10/2018   Please let us know how we can help until your big move. Good luck!    No orders of the defined types were placed in this encounter.  Meds ordered this encounter  Medications  . predniSONE (DELTASONE) 10 MG tablet    Sig: Take 4 tablets (40 mg total)  by mouth daily with breakfast for 5 days.    Dispense:  20 tablet    Refill:  0  . HYDROcodone-homatropine (HYCODAN) 5-1.5 MG/5ML syrup    Sig: TAKE 1 TEASPOONFUL (5ML) BY MOUTH EVERY 6 HOURS AS NEEDED    Dispense:  473 mL    Refill:  0  . azithromycin (ZITHROMAX) 250 MG tablet    Sig: Take 1 tablet (250 mg total) by mouth daily.    Dispense:  1 tablet    Refill:  6    Return if symptoms worsen or fail to improve.  Micky Overturf Rodman Pickle, MD [facility] 09/05/2018 12:38 PM  Personal pager: [phone] If unanswered, please page CCM On-call: [phone]

## 2018-09-06 ENCOUNTER — Encounter (HOSPITAL_COMMUNITY): Payer: Medicare Other

## 2018-09-06 NOTE — Progress Notes (Signed)
Discharge Progress Report  Patient Details  Name: Ellen Bowers MRN: [id number] Date of Birth: August 24, 1945 Referring Provider:     PULMONARY REHAB COPD ORIENTATION from 03/17/2018 in [place]  Referring Provider  Loanne Drilling       Number of Visits: 17  Reason for Discharge:  Patient reached a stable level of exercise. Patient independent in their exercise. Patient has met program and personal goals.  Smoking History:  Social History   Tobacco Use  Smoking Status Former Smoker  . Packs/day: 1.50  . Years: 50.00  . Pack years: 75.00  . Types: Cigarettes  . Start date: 03/17/1961  . Quit date: 05/30/2009  . Years since quitting: 9.2  Smokeless Tobacco Never Used  Tobacco Comment   Quit smoking x 7 years    Diagnosis:  Chronic obstructive pulmonary disease, unspecified COPD type ([place])  ADL UCSD: Pulmonary Assessment Scores    Row Name 03/17/18 1132 09/06/18 1445       ADL UCSD   ADL Phase  Entry  Exit    SOB Score total  50  56    Rest  0  0    Walk  5  7    Stairs  3  4    Bath  3  2    Dress  2  2    Shop  1  3      CAT Score   CAT Score  22  25      mMRC Score   mMRC Score  3  2       Initial Exercise Prescription: Initial Exercise Prescription - 03/17/18 1000      Date of Initial Exercise RX and Referring Provider   Date  03/17/18    Referring Provider  Loanne Drilling    Expected Discharge Date  06/17/18      Treadmill   MPH  1    Grade  0    Minutes  17    METs  1.88      NuStep   Level  1    SPM  40    Minutes  17    METs  1.8      Prescription Details   Frequency (times per week)  2    Duration  Progress to 30 minutes of continuous aerobic without signs/symptoms of physical distress      Intensity   THRR 40-80% of Max Heartrate  107-121-134    Ratings of Perceived Exertion  11-13    Perceived Dyspnea  0-4      Progression   Progression  Continue to progress workloads to maintain intensity without  signs/symptoms of physical distress.      Resistance Training   Training Prescription  Yes    Weight  1    Reps  10-15       Discharge Exercise Prescription (Final Exercise Prescription Changes): Exercise Prescription Changes - 08/23/18 1200      Response to Exercise   Blood Pressure (Admit)  150/70    Blood Pressure (Exercise)  170/88    Blood Pressure (Exit)  160/80    Heart Rate (Admit)  84 bpm    Heart Rate (Exercise)  111 bpm    Heart Rate (Exit)  92 bpm    Oxygen Saturation (Admit)  94 %    Oxygen Saturation (Exercise)  96 %    Oxygen Saturation (Exit)  98 %    Rating of Perceived Exertion (Exercise)  13  Perceived Dyspnea (Exercise)  14    Duration  Continue with 30 min of aerobic exercise without signs/symptoms of physical distress.    Intensity  THRR unchanged      Progression   Progression  Continue to progress workloads to maintain intensity without signs/symptoms of physical distress.    Average METs  2.06      Resistance Training   Training Prescription  Yes    Weight  2    Reps  10-15      Treadmill   MPH  1.6    Grade  0    Minutes  17    METs  2.22      NuStep   Level  2    SPM  82    Minutes  17    METs  2      Home Exercise Plan   Plans to continue exercise at  Home (comment)    Frequency  Add 3 additional days to program exercise sessions.    Initial Home Exercises Provided  03/17/18       Functional Capacity: 6 Minute Walk    Row Name 03/17/18 1007 09/05/18 1412       6 Minute Walk   Phase  Initial  Discharge    Distance  1200 feet  1400 feet    Distance % Change  -  16 %    Distance Feet Change  -  200 ft    Walk Time  6 minutes  6 minutes    # of Rest Breaks  0  0    MPH  2.27  2.65    METS  2.74  3.03    RPE  13  16    Perceived Dyspnea   14  14    VO2 Peak  9.64  11.92    Symptoms  Yes (comment)  Yes (comment)    Comments  2/10 R knee pain, 3/10 bilateral hip pain, lightheadedness   4/10 R knee pain, needed 2 puffs of  her inhaler.    Resting HR  81 bpm  80 bpm    Resting BP  128/70  130/70    Resting Oxygen Saturation   99 %  96 %    Exercise Oxygen Saturation  during 6 min walk  91 %  95 %    Max Ex. HR  106 bpm  117 bpm    Max Ex. BP  160/80  180/50    2 Minute Post BP  136/76  150/70       Psychological, QOL, Others - Outcomes: PHQ 2/9: Depression screen [facility] 2/9 09/06/2018 03/17/2018  Decreased Interest 1 1  Down, Depressed, Hopeless 1 0  PHQ - 2 Score 2 1  Altered sleeping 2 3  Tired, decreased energy 2 1  Change in appetite 0 2  Feeling bad or failure about yourself  0 0  Trouble concentrating 0 0  Moving slowly or fidgety/restless 0 0  Suicidal thoughts 0 0  PHQ-9 Score 6 7  Difficult doing work/chores Not difficult at all Somewhat difficult    Quality of Life: Quality of Life - 09/05/18 1415      Quality of Life   Select  Quality of Life      Quality of Life Scores   Health/Function Pre  15.59 %    Health/Function Post  16.91 %    Health/Function % Change  8.47 %    Socioeconomic Pre  21.29 %  Socioeconomic Post  20.17 %    Socioeconomic % Change   -5.26 %    Psych/Spiritual Pre  13.86 %    Psych/Spiritual Post  15.71 %    Psych/Spiritual % Change  13.35 %    Family Pre  13.1 %    Family Post  16.5 %    Family % Change  25.95 %    GLOBAL Pre  16.03 %    GLOBAL Post  16.91 %    GLOBAL % Change  5.49 %       Personal Goals: Goals established at orientation with interventions provided to work toward goal. Personal Goals and Risk Factors at Admission - 03/17/18 1141      Core Components/Risk Factors/Patient Goals on Admission    Weight Management  Yes    Intervention  Weight Management: Develop a combined nutrition and exercise program designed to reach desired caloric intake, while maintaining appropriate intake of nutrient and fiber, sodium and fats, and appropriate energy expenditure required for the weight goal.    Admit Weight  206 lb 8 oz (93.7 kg)    Goal  Weight: Short Term  196 lb 8 oz (89.1 kg)    Goal Weight: Long Term  186 lb 8 oz (84.6 kg)    Personal Goal Other  Yes    Personal Goal  Lose 20lbs, ambulate better w/SOB, increase endurance     Intervention  Attend PR 2 x week, supplement with 3 x week at home exercise.     Expected Outcomes  Reach personal goal.         Personal Goals Discharge: Goals and Risk Factor Review    Row Name 04/04/18 1453 08/02/18 1343 08/24/18 1435 09/06/18 1453       Core Components/Risk Factors/Patient Goals Review   Personal Goals Review  Weight Management/Obesity;Improve shortness of breath with ADL's Lose 20 lbs; ambulate better w/o SOB; increase endurance.   Weight Management/Obesity;Improve shortness of breath with ADL's Lose 20 lbs; ambulate better w/o SOB; increase endruance.  Weight Management/Obesity;Improve shortness of breath with ADL's Lose 20 lbs, ambulate better w/o SOB; increase endurance.  Weight Management/Obesity;Improve shortness of breath with ADL's Lose 20 lbs; Ambulate better; decrease SOB; increase endurance.    Review  Patient has completed 3 sessions maintaining her weight since she started the program. She is doing well in the program. She says she has only had 2 sessions and has not noticed much progress yet. Outpatient pulmonary services was suspended 03/28/18 due to COVID-19 restrictions. Patient says she plans to walk to her mailbox everyday which is on an incline to remain active. Will continue to monitor.   Pulmonary Rehab closed 03/28/18 and reopened 07/11/18. Patient returned to the program losing 4 lbs during the closure. She is doing well in the program with progression. She says does feel stronger. She says her rigth knee bothers her at times decreasing her activity at home. She says she had a recent CT scan showing 2 new spots on her lungs. She says they are going to monitor this and do a repeat CT scan in 3 months. She is concerned that her lung cancer had returned. Will continue to  monitor for progress.  Patient has completed 14 sessions gaining 2 lbs since her last 30 day review. She continues to do well in the program with progression. She plans on moving to [place] soon. She has already sold her house locally. We plan to graduate her in the next 5 sessions.  She says she feels somewhat stronger since she started but continues to struggle with right knee pain. Her b/p has been elevated recently as well. Will continue to monitor for progress.  Patient graduated after completing 17 sessions losing 6 lbs overall. She stopped early because she is moving out of state and hopes to find a pulmonary program that will allow her to complete the program. Her exit measurements improved in her 6 minute walk test which increased by 16.1%. She does not feel she has meet her goals. She says she does not feel stronger and has not increased her activity at home. She blames this on the pandemic and the fact that she is moving much faster than she anticapted becuase her house was sold in 1 week. She feels all the stress and activity with preparing to move has taken her energy and also caused her elevated blood pressure readings. She is pleased with the program and again hopes to complete her sessions in [place] state. She hopes to exercise by walking once she gets settled into her new home. MD will be notified.    Expected Outcomes  Patient will continue to attend sessions and complete the program meeting her personal goald.   Patient will continue to attend sessions and complete the program meeting her personal goald.   Patient will continue to attend sessions and complete the program meeting her personal goals.  Patient will be able to complete the program in [place] and be able to walk for exercise and work toward meeting her personal goals.       Exercise Goals and Review: Exercise Goals    Row Name 03/17/18 1014             Exercise Goals   Increase Physical Activity  Yes        Intervention  Provide advice, education, support and counseling about physical activity/exercise needs.;Develop an individualized exercise prescription for aerobic and resistive training based on initial evaluation findings, risk stratification, comorbidities and participant's personal goals.       Expected Outcomes  Short Term: Attend rehab on a regular basis to increase amount of physical activity.;Long Term: Add in home exercise to make exercise part of routine and to increase amount of physical activity.;Long Term: Exercising regularly at least 3-5 days a week.       Increase Strength and Stamina  Yes       Intervention  Provide advice, education, support and counseling about physical activity/exercise needs.;Develop an individualized exercise prescription for aerobic and resistive training based on initial evaluation findings, risk stratification, comorbidities and participant's personal goals.       Expected Outcomes  Short Term: Increase workloads from initial exercise prescription for resistance, speed, and METs.       Able to understand and use rate of perceived exertion (RPE) scale  Yes       Intervention  Provide education and explanation on how to use RPE scale       Expected Outcomes  Short Term: Able to use RPE daily in rehab to express subjective intensity level;Long Term:  Able to use RPE to guide intensity level when exercising independently       Able to understand and use Dyspnea scale  Yes       Intervention  Provide education and explanation on how to use Dyspnea scale       Expected Outcomes  Short Term: Able to use Dyspnea scale daily in rehab to express subjective sense of  shortness of breath during exertion;Long Term: Able to use Dyspnea scale to guide intensity level when exercising independently       Knowledge and understanding of Target Heart Rate Range (THRR)  Yes       Intervention  Provide education and explanation of THRR including how the numbers were predicted and where  they are located for reference       Expected Outcomes  Short Term: Able to state/look up THRR       Able to check pulse independently  Yes       Intervention  Provide education and demonstration on how to check pulse in carotid and radial arteries.;Review the importance of being able to check your own pulse for safety during independent exercise       Expected Outcomes  Short Term: Able to explain why pulse checking is important during independent exercise;Long Term: Able to check pulse independently and accurately       Understanding of Exercise Prescription  Yes       Intervention  Provide education, explanation, and written materials on patient's individual exercise prescription       Expected Outcomes  Short Term: Able to explain program exercise prescription;Long Term: Able to explain home exercise prescription to exercise independently          Exercise Goals Re-Evaluation: Exercise Goals Re-Evaluation    Row Name 04/04/18 1121 08/02/18 1251 08/23/18 1240         Exercise Goal Re-Evaluation   Exercise Goals Review  Increase Physical Activity;Able to understand and use rate of perceived exertion (RPE) scale;Knowledge and understanding of Target Heart Rate Range (THRR);Understanding of Exercise Prescription;Increase Strength and Stamina;Able to understand and use Dyspnea scale;Able to check pulse independently  Increase Physical Activity;Increase Strength and Stamina;Able to understand and use rate of perceived exertion (RPE) scale;Able to understand and use Dyspnea scale;Knowledge and understanding of Target Heart Rate Range (THRR);Able to check pulse independently;Understanding of Exercise Prescription  Increase Physical Activity;Increase Strength and Stamina;Able to understand and use rate of perceived exertion (RPE) scale;Able to understand and use Dyspnea scale;Knowledge and understanding of Target Heart Rate Range (THRR);Able to check pulse independently;Understanding of Exercise  Prescription     Comments  Pt. is new to the program. She has attended 3 sessions so far. We switched her off of the NuStep due to knee pain, but she is tolerating the TM and Arm Ergometer well.   Pt. is doing well in Pulmonary Rehab. She is somewhat weak but feels this helps her to improve her stength and walking abilities.  Pt. continues to be consistant in attending rehab. She has continued to follow her exercise prescription to aid her in reaching her goals.     Expected Outcomes  ambulate better and decrease SOB.   Short: increase overall activity level. Long: increased endurance.  Short: increase overall activity level. Long: increased endurance.        Nutrition & Weight - Outcomes: Pre Biometrics - 03/17/18 1015      Pre Biometrics   Height  '5\' 9"'$  (1.753 m)    Waist Circumference  42 inches    Hip Circumference  46 inches    Waist to Hip Ratio  0.91 %    Triceps Skinfold  24 mm    % Body Fat  41.6 %    Grip Strength  10 kg    Flexibility  0 in    Single Leg Stand  5.84 seconds      Post Biometrics -  09/05/18 1414       Post  Biometrics   Height  '5\' 9"'$  (1.753 m)    Weight  93.6 kg    Waist Circumference  45 inches    Hip Circumference  46 inches    Waist to Hip Ratio  0.98 %    BMI (Calculated)  30.46    Triceps Skinfold  15 mm    % Body Fat  39.7 %    Grip Strength  4.6 kg    Single Leg Stand  5 seconds       Nutrition: Nutrition Therapy & Goals - 08/24/18 1433      Personal Nutrition Goals   Additional Goals?  No    Comments  We have not resumed the RD meeting post COVID. We are working with our new dietician to resume this meeting safely. In the interim, we are providing handouts and doing education one-on-one as needed. Patient still feels like she is eating healthy. She says she is still not eating deserts and is sticking to her reduced sugar in her coffee.  Will continue to monitor      Intervention Plan   Intervention  Nutrition handout(s) given to patient.        Nutrition Discharge: Nutrition Assessments - 09/06/18 1450      MEDFICTS Scores   Pre Score  77    Post Score  69    Score Difference  -8       Education Questionnaire Score: Knowledge Questionnaire Score - 09/06/18 1450      Knowledge Questionnaire Score   Pre Score  16/18    Post Score  18/18       Goals reviewed with patient; copy given to patient.  Patient graduated from Pulmonary Rehabilitation today on 09/01/18 after completing 17 sessions. She achieved LTG of 30 minutes of aerobic exercise at Max Met level of 2.2. All patients vitals are WNL.  Discharge instruction has been reviewed in detail and patient stated an understanding of material given. Patient plans to exercise at home by walking and she hopes to complete the 19 sessions to make 36 total in [place] once she is settled in. Pulmonary Rehab staff will make f/u calls at 1 month, 6 months, and 1 year. Patient had no complaints of any abnormal S/S or pain on their exit visit.

## 2018-09-08 ENCOUNTER — Encounter (HOSPITAL_COMMUNITY): Payer: Medicare Other

## 2018-09-08 MED ORDER — AZITHROMYCIN 250 MG PO TABS: 250.0000 mg | ORAL_TABLET | Freq: Every day | ORAL | 6 refills | Status: AC

## 2018-09-08 NOTE — Addendum Note (Signed)
Addended by: Amado Coe on: 09/08/2018 12:55 PM   Modules accepted: Orders

## 2018-09-13 ENCOUNTER — Telehealth: Payer: Self-pay | Admitting: Pulmonary Disease

## 2018-09-13 ENCOUNTER — Encounter (HOSPITAL_COMMUNITY): Payer: Medicare Other

## 2018-09-13 NOTE — Telephone Encounter (Signed)
Spoke with Belenda Cruise from [facility], the quantity for the azithromycin was #1. I changed the quantity to #30 since JE instructed pt to take the medication daily. Nothing further is needed.    9. Margaretha Seeds, MD (Physician) at 09/05/2018 11:04 AM - Signed    COPD with chronic bronchitis and emphysema --Continue daily azithromycin 250 mg daily --Continue Advair 500 mg twice a day. With eosinophilia, patient continues to benefit from ICS therapy. --Continue scheduled Duonebs three times a day --Continue Pulmicort as needed for wheezing --START Prednisone 40 mg for five days  --REFILLED Hycodan syrup for cough

## 2018-09-15 ENCOUNTER — Encounter (HOSPITAL_COMMUNITY): Payer: Medicare Other

## 2018-09-20 ENCOUNTER — Telehealth: Payer: Self-pay | Admitting: Pulmonary Disease

## 2018-09-20 ENCOUNTER — Encounter (HOSPITAL_COMMUNITY): Payer: Medicare Other

## 2018-09-20 NOTE — Telephone Encounter (Signed)
Spoke with customer service rep from [facility]. I provided the diagnosis code for the azithromycin (J44.9). Nothing further is needed,

## 2018-09-22 ENCOUNTER — Encounter (HOSPITAL_COMMUNITY): Payer: Medicare Other

## 2018-09-27 ENCOUNTER — Encounter (HOSPITAL_COMMUNITY): Payer: Medicare Other

## 2018-09-29 ENCOUNTER — Encounter (HOSPITAL_COMMUNITY): Payer: Medicare Other

## 2018-10-04 ENCOUNTER — Encounter (HOSPITAL_COMMUNITY): Payer: Medicare Other

## 2018-10-05 ENCOUNTER — Encounter: Payer: Self-pay | Admitting: Orthopaedic Surgery

## 2018-10-05 ENCOUNTER — Ambulatory Visit (INDEPENDENT_AMBULATORY_CARE_PROVIDER_SITE_OTHER): Payer: Medicare Other | Admitting: Orthopaedic Surgery

## 2018-10-05 VITALS — BP 138/88 | HR 102 | Ht 69.0 in | Wt 204.0 lb

## 2018-10-05 DIAGNOSIS — M1711 Unilateral primary osteoarthritis, right knee: Secondary | ICD-10-CM

## 2018-10-05 MED ORDER — LIDOCAINE HCL 1 % IJ SOLN
2.0000 mL | INTRAMUSCULAR | Status: AC | PRN
  Administered 2018-10-05: 2 mL

## 2018-10-05 MED ORDER — BUPIVACAINE HCL 0.5 % IJ SOLN
2.0000 mL | INTRAMUSCULAR | Status: AC | PRN
  Administered 2018-10-05: 2 mL via INTRA_ARTICULAR

## 2018-10-05 MED ORDER — METHYLPREDNISOLONE ACETATE 40 MG/ML IJ SUSP
80.0000 mg | INTRAMUSCULAR | Status: AC | PRN
  Administered 2018-10-05: 80 mg via INTRA_ARTICULAR

## 2018-10-05 NOTE — Progress Notes (Signed)
Office Visit Note   Patient: Ellen Bowers           Date of Birth: Apr 11, 1945           MRN: [id number] Visit Date: 10/05/2018              Requested by: [name], MD [address] [place],  [place] [address] PCP: [name], MD   Assessment & Plan: Visit Diagnoses:  1. Unilateral primary osteoarthritis, right knee     Plan: Recurrent symptoms of osteoarthritis right knee without injury or trauma.  Will inject the knee with cortisone and follow-up as needed.  Mrs. Rader is moving [place] her [place] within the next several days Follow-Up Instructions: Return if symptoms worsen or fail to improve.   Orders:  Orders Placed This Encounter  Procedures  . Large Joint Inj: R knee   No orders of the defined types were placed in this encounter.     Procedures: Large Joint Inj: R knee on 10/05/2018 2:45 PM Indications: pain and diagnostic evaluation Details: 25 G 1.5 in needle, anterolateral approach  Arthrogram: No  Medications: 2 mL lidocaine 1 %; 2 mL bupivacaine 0.5 %; 80 mg methylPREDNISolone acetate 40 MG/ML Procedure, treatment alternatives, risks and benefits explained, specific risks discussed. Consent was given by the patient. Immediately prior to procedure a time out was called to verify the correct patient, procedure, equipment, support staff and site/side marked as required. Patient was prepped and draped in the usual sterile fashion.       Clinical Data: No additional findings.   Subjective: Chief Complaint  Patient presents with  . Right Knee - Follow-up  Patient presents today for right knee pain. She had a cortisone injection on 08/17/2018. She is moving to [place] this Friday and wants another cortisone injection today.   HPI  Review of Systems   Objective: Vital Signs: BP 138/88   Pulse (!) 102   Ht 5\' 9"  (1.753 m)   Wt 204 lb (92.5 kg)   BMI 30.13 kg/m   Physical Exam  Ortho Exam minimal effusion right knee.  The knee  was not hot red warm or swollen.  Seems to have slight increased valgus with weightbearing.  Mostly lateral joint pain no distal edema.  Neurologically intact.  No pain with range of motion of either hip  Specialty Comments:  No specialty comments available.  Imaging: No results found.   PMFS History: Patient Active Problem List   Diagnosis Date Noted  . Trochanteric bursitis, right hip 08/24/2018  . Low back pain 08/17/2018  . Lung nodules 08/04/2018  . Constipation 05/17/2018  . Chronic pain of right knee 12/29/2017  . 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 Unilateral primary osteoarthritis, right knee 08/18/2017  . Lung cancer, lower lobe ([place]) 02/08/2017  . Dyspepsia   . Anorectal fissure 10/24/2014  . History of colon polyps 10/24/2014  . COPD with chronic bronchitis and emphysema ([place]) 06/07/2014  . Hemorrhoids 05/07/2014  . GERD (gastroesophageal reflux disease) 09/06/2013   Past Medical History:  Diagnosis Date  . Anxiety   . Arthritis    R knee, bilateral hips, hands & neck- being given steroid injections   . Asthma   . Blood dyscrasia   . Cancer (HCC)    L hand  . COPD (chronic obstructive pulmonary disease) ([place])   . Depression   . Dyspnea   . GERD (gastroesophageal reflux disease)   . Helicobacter pylori gastritis  2005  . Hyperlipidemia   . 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 Von Willebrand disease ([place]) 1996    Family History  Problem Relation Age of Onset  . Colon cancer Mother        Less than age 18  . Colon cancer Father        More than age 39, died from metastatic disease at age 65  . Liver disease Neg Hx     Past Surgical History:  Procedure Laterality Date  . BIOPSY  12/08/2016   Procedure: BIOPSY;  Surgeon: Danie Binder, MD;  [facility];  Service: Endoscopy;;  . BRAVO [place] STUDY N/A 12/08/2016   Procedure: BRAVO [place]; Bravo Capsule Placement;  Surgeon: Danie Binder, MD;  Location[facility];   Service: Endoscopy;  Laterality: N/A;  . BREAST LUMPECTOMY Left    benign  . CATARACT EXTRACTION     double  . COLONOSCOPY N/A 05/30/2012   Dr.Mann:Multiple polyps removed from the right colon-see description above; otherwise normal colonoscopy up to the cecum. 5 polyps, multiple tubular adenomas. next tcs 05/2015  . COLONOSCOPY N/A 12/13/2015   Procedure: COLONOSCOPY;  Surgeon: Danie Binder, MD;  [facility];  Service: Endoscopy;  Laterality: N/A;  8:30 am  . ESOPHAGOGASTRODUODENOSCOPY  2012   Dr. Collene Mares: few erosions in duodenal bulb. BRAVO off PPI ("severe reflux")  . ESOPHAGOGASTRODUODENOSCOPY N/A 06/12/2014   SLF: 1. The mucosa of the esophagus appeared normal 2. Mild non-erosive gastrtitis.   [name] ESOPHAGOGASTRODUODENOSCOPY (EGD) WITH PROPOFOL N/A 12/08/2016   Procedure: ESOPHAGOGASTRODUODENOSCOPY (EGD) WITH PROPOFOL;  Surgeon: Danie Binder, MD;  [facility];  Service: Endoscopy;  Laterality: N/A;  7:30am  . EYE SURGERY Bilateral    L& R  . FLEXIBLE SIGMOIDOSCOPY N/A 02/05/2014   Dr. Oneida Alar: 5, 2-3 mm sessile sigmoid colon polyps removed, small internal hemorrhoids, moderate external hemorrhoids, 2 anal fissures present. Polyps were hyperplastic.   [name] NECK SURGERY  1951   cyst removal  . NECK SURGERY  1998   growth removed  . THORACOTOMY Right 02/08/2017   Procedure: THORACOTOMY MAJOR;  Surgeon: Gaye Pollack, MD;  Location: [facility] OR;  Service: Thoracic;  Laterality: Right;  . TONSILLECTOMY  1951  . TUBAL LIGATION  1984  . VIDEO ASSISTED THORACOSCOPY (VATS)/WEDGE RESECTION Right 02/08/2017   Procedure: VIDEO ASSISTED THORACOSCOPY (VATS)/WEDGE RESECTION;  Surgeon: Gaye Pollack, MD;  Location: [facility] OR;  Service: Thoracic;  Laterality: Right;   Social History   Occupational History  . Not on file  Tobacco Use  . Smoking status: Former Smoker    Packs/day: 1.50    Years: 50.00    Pack years: 75.00    Types: Cigarettes    Start date: 03/17/1961    Quit date:  05/30/2009    Years since quitting: 9.3  . Smokeless tobacco: Never Used  . Tobacco comment: Quit smoking x 7 years  Substance and Sexual Activity  . Alcohol use: Not Currently    Comment: Occasionally    . Drug use: No  . Sexual activity: Not on file

## 2018-10-06 ENCOUNTER — Encounter (HOSPITAL_COMMUNITY): Payer: Medicare Other

## 2018-10-11 ENCOUNTER — Encounter (HOSPITAL_COMMUNITY): Payer: Medicare Other

## 2018-10-13 ENCOUNTER — Encounter (HOSPITAL_COMMUNITY): Payer: Medicare Other

## 2018-10-18 ENCOUNTER — Encounter (HOSPITAL_COMMUNITY): Payer: Medicare Other

## 2018-10-19 ENCOUNTER — Telehealth: Payer: Self-pay | Admitting: Pulmonary Disease

## 2018-10-19 NOTE — Telephone Encounter (Signed)
Call returned to patient, requesting records be sent to [place] as she has moved. Medical records number given [phone]. Voiced understanding. Nothing further needed at this time.

## 2018-10-20 ENCOUNTER — Encounter (HOSPITAL_COMMUNITY): Payer: Medicare Other

## 2018-10-25 ENCOUNTER — Encounter (HOSPITAL_COMMUNITY): Payer: Medicare Other

## 2018-10-27 ENCOUNTER — Encounter (HOSPITAL_COMMUNITY): Payer: Medicare Other

## 2018-11-01 ENCOUNTER — Encounter (HOSPITAL_COMMUNITY): Payer: Medicare Other

## 2018-11-03 ENCOUNTER — Encounter (HOSPITAL_COMMUNITY): Payer: Medicare Other

## 2018-11-09 ENCOUNTER — Ambulatory Visit: Payer: Medicare Other | Admitting: Surgery

## 2018-11-22 ENCOUNTER — Encounter: Payer: Self-pay | Admitting: Gastroenterology

## 2019-02-24 ENCOUNTER — Telehealth: Payer: Self-pay | Admitting: Orthopaedic Surgery

## 2019-02-24 NOTE — Telephone Encounter (Signed)
Received call from patient requesting 3 release forms for her and her husband.I mailed to her [address] [place] [place]  [address]
